# Patient Record
Sex: Female | Born: 1990
Health system: Southern US, Community
[De-identification: ages and names within clinical notes are randomized; demographics above are authoritative.]

## PROBLEM LIST (undated history)

## (undated) DIAGNOSIS — D649 Anemia, unspecified: Secondary | ICD-10-CM

## (undated) DIAGNOSIS — M79671 Pain in right foot: Secondary | ICD-10-CM

## (undated) DIAGNOSIS — R0600 Dyspnea, unspecified: Secondary | ICD-10-CM

## (undated) DIAGNOSIS — I10 Essential (primary) hypertension: Secondary | ICD-10-CM

## (undated) DIAGNOSIS — R600 Localized edema: Secondary | ICD-10-CM

## (undated) DIAGNOSIS — R45851 Suicidal ideations: Secondary | ICD-10-CM

## (undated) DIAGNOSIS — K5900 Constipation, unspecified: Secondary | ICD-10-CM

## (undated) DIAGNOSIS — F419 Anxiety disorder, unspecified: Secondary | ICD-10-CM

## (undated) DIAGNOSIS — F329 Major depressive disorder, single episode, unspecified: Secondary | ICD-10-CM

## (undated) DIAGNOSIS — R9431 Abnormal electrocardiogram [ECG] [EKG]: Secondary | ICD-10-CM

## (undated) DIAGNOSIS — M25569 Pain in unspecified knee: Secondary | ICD-10-CM

## (undated) DIAGNOSIS — E669 Obesity, unspecified: Secondary | ICD-10-CM

## (undated) DIAGNOSIS — Z789 Other specified health status: Secondary | ICD-10-CM

## (undated) DIAGNOSIS — F32A Depression, unspecified: Secondary | ICD-10-CM

## (undated) DIAGNOSIS — I2699 Other pulmonary embolism without acute cor pulmonale: Secondary | ICD-10-CM

## (undated) DIAGNOSIS — M79672 Pain in left foot: Secondary | ICD-10-CM

## (undated) HISTORY — DX: Dyspnea, unspecified: R06.00

## (undated) HISTORY — DX: Localized edema: R60.0

## (undated) HISTORY — DX: Essential (primary) hypertension: I10

## (undated) HISTORY — DX: Constipation, unspecified: K59.00

## (undated) HISTORY — PX: NO PAST SURGERIES: SHX2092

## (undated) HISTORY — DX: Pain in right foot: M79.672

## (undated) HISTORY — DX: Pain in unspecified knee: M25.569

## (undated) HISTORY — DX: Pain in right foot: M79.671

## (undated) HISTORY — DX: Suicidal ideations: R45.851

## (undated) HISTORY — DX: Other pulmonary embolism without acute cor pulmonale: I26.99

## (undated) HISTORY — DX: Abnormal electrocardiogram (ECG) (EKG): R94.31

## (undated) HISTORY — DX: Obesity, unspecified: E66.9

## (undated) HISTORY — DX: Anemia, unspecified: D64.9

## (undated) HISTORY — DX: Anxiety disorder, unspecified: F41.9

---

## 2013-02-15 ENCOUNTER — Encounter (HOSPITAL_COMMUNITY): Payer: Self-pay | Admitting: Emergency Medicine

## 2013-02-15 ENCOUNTER — Emergency Department (HOSPITAL_COMMUNITY)
Admission: EM | Admit: 2013-02-15 | Discharge: 2013-02-16 | Disposition: A | Discharge status: Other Acute Inpatient Hospital | Payer: BC Managed Care – PPO | Attending: Emergency Medicine | Admitting: Emergency Medicine

## 2013-02-15 DIAGNOSIS — Z3202 Encounter for pregnancy test, result negative: Secondary | ICD-10-CM | POA: Insufficient documentation

## 2013-02-15 DIAGNOSIS — R45851 Suicidal ideations: Secondary | ICD-10-CM

## 2013-02-15 LAB — COMPREHENSIVE METABOLIC PANEL
ALT: 7 U/L (ref 0–35)
AST: 15 U/L (ref 0–37)
Albumin: 4.1 g/dL (ref 3.5–5.2)
Alkaline Phosphatase: 95 U/L (ref 39–117)
BUN: 7 mg/dL (ref 6–23)
CO2: 24 mEq/L (ref 19–32)
Chloride: 105 mEq/L (ref 96–112)
Creatinine, Ser: 0.81 mg/dL (ref 0.50–1.10)
GFR calc non Af Amer: >90 mL/min (ref >90)
Potassium: 3.7 mEq/L (ref 3.5–5.1)
Sodium: 138 mEq/L (ref 135–145)
Total Bilirubin: 1 mg/dL (ref 0.3–1.2)

## 2013-02-15 LAB — ETHANOL: Alcohol, Ethyl (B): <11 mg/dL (ref 0–11)

## 2013-02-15 LAB — CBC
HCT: 34.7 % — ABNORMAL LOW (ref 36.0–46.0)
Hemoglobin: 10.4 g/dL — ABNORMAL LOW (ref 12.0–15.0)
MCV: 75.1 fL — ABNORMAL LOW (ref 78.0–100.0)
RBC: 4.62 MIL/uL (ref 3.87–5.11)
RDW: 14.6 % (ref 11.5–15.5)
WBC: 4.9 10*3/uL (ref 4.0–10.5)

## 2013-02-15 LAB — RAPID URINE DRUG SCREEN, HOSP PERFORMED
Benzodiazepines: NOT DETECTED
Opiates: NOT DETECTED
Tetrahydrocannabinol: NOT DETECTED

## 2013-02-15 MED ORDER — IBUPROFEN 200 MG PO TABS: 600.0000 mg | ORAL_TABLET | Freq: Three times a day (TID) | ORAL | Status: DC | PRN

## 2013-02-15 MED ORDER — LORAZEPAM 1 MG PO TABS: 1.0000 mg | ORAL_TABLET | Freq: Three times a day (TID) | ORAL | Status: DC | PRN

## 2013-02-15 MED ORDER — ONDANSETRON HCL 4 MG PO TABS: 4.0000 mg | ORAL_TABLET | Freq: Three times a day (TID) | ORAL | Status: DC | PRN

## 2013-02-15 MED ORDER — ACETAMINOPHEN 325 MG PO TABS: 650.0000 mg | ORAL_TABLET | ORAL | Status: DC | PRN

## 2013-02-15 MED ORDER — NICOTINE 21 MG/24HR TD PT24: 21.0000 mg | MEDICATED_PATCH | Freq: Every day | TRANSDERMAL | Status: DC

## 2013-02-15 MED ORDER — ZOLPIDEM TARTRATE 5 MG PO TABS: 5.0000 mg | ORAL_TABLET | Freq: Every evening | ORAL | Status: DC | PRN

## 2013-02-15 NOTE — ED Provider Notes (Signed)
CSN: [id number]     Arrival date & time 02/15/13  2014 History   First MD Initiated Contact with Patient 02/15/13 2056     Chief Complaint  Patient presents with  . Medical Clearance  . Suicidal   (Consider location/radiation/quality/duration/timing/severity/associated sxs/prior Treatment) The history is provided by the patient.   patient here with suicidal ideations with plan to cut her wrists. History of suicide attempt in February of this year. She denies any toxic ingestions at this time. Does have a history of depression but is not taking medications currently for this. Denies any command hallucinations. No alcohol or illicit drug use. Patient will not answer questions if there is something to exacerbate her current situation. No treatment used prior to arrival and nothing makes her symptoms better or worse.  History reviewed. No pertinent past medical history. History reviewed. No pertinent past surgical history. History reviewed. No pertinent family history. History  Substance Use Topics  . Smoking status: Never Smoker   . Smokeless tobacco: Not on file  . Alcohol Use: No   OB History   Grav Para Term Preterm Abortions TAB SAB Ect Mult Living                 Review of Systems  All other systems reviewed and are negative.    Allergies  Review of patient's allergies indicates no known allergies.  Home Medications  No current outpatient prescriptions on file. BP 145/89  Pulse 85  Temp(Src) 98.4 F (36.9 C) (Oral)  Resp 18  SpO2 100%  LMP 02/12/2013 Physical Exam  Nursing note and vitals reviewed. Constitutional: She is oriented to person, place, and time. She appears well-developed and well-nourished.  Non-toxic appearance. No distress.  HENT:  Head: Normocephalic and atraumatic.  Eyes: Conjunctivae, EOM and lids are normal. Pupils are equal, round, and reactive to light.  Neck: Normal range of motion. Neck supple. No tracheal deviation present. No mass present.   Cardiovascular: Normal rate, regular rhythm and normal heart sounds.  Exam reveals no gallop.   No murmur heard. Pulmonary/Chest: Effort normal and breath sounds normal. No stridor. No respiratory distress. She has no decreased breath sounds. She has no wheezes. She has no rhonchi. She has no rales.  Abdominal: Soft. Normal appearance and bowel sounds are normal. She exhibits no distension. There is no tenderness. There is no rebound and no CVA tenderness.  Musculoskeletal: Normal range of motion. She exhibits no edema and no tenderness.  Neurological: She is alert and oriented to person, place, and time. She has normal strength. No cranial nerve deficit or sensory deficit. GCS eye subscore is 4. GCS verbal subscore is 5. GCS motor subscore is 6.  Skin: Skin is warm and dry. No abrasion and no rash noted.  Psychiatric: Her affect is not blunt. Her speech is not delayed. She is not slowed. Thought content is not delusional. She expresses suicidal ideation. She expresses no homicidal ideation. She expresses suicidal plans. She expresses no homicidal plans.    ED Course  Procedures (including critical care time) Labs Review Labs Reviewed  CBC  COMPREHENSIVE METABOLIC PANEL  ETHANOL  URINE RAPID DRUG SCREEN (HOSP PERFORMED)   Imaging Review No results found.  EKG Interpretation   None       MDM  No diagnosis found. Patient has been medically cleared and will be seen by bhh    Toy Baker, MD 02/15/13 2105

## 2013-02-15 NOTE — ED Notes (Signed)
Pt brought in with [facility] police, they have had several calls today about her threatening to kill herself, they finally found her a few minutes ago and brought her in for medical clearance, [employer] officer is getting the IVC papers

## 2013-02-15 NOTE — BH Assessment (Signed)
[facility] Assessment Progress Note      Consulted Dr Freida Busman regarding TTS consult for [facility]  He reports, the patient called her friend and told her that she wanted to kill herself and that she says she cut her wrists in a suicide attempt, but would not let Dr Freida Busman see to verify.  Pt was brought in by [facility] police and is under IVC.

## 2013-02-15 NOTE — ED Notes (Signed)
TTS consult completed by Marchelle Folks.

## 2013-02-15 NOTE — ED Notes (Signed)
Pt belongings consist of: 1 pair of blue jeans,($5.89 in jean pocket) multicolor t-shirt, white footies, grey and pink sneakers, tan belt, panties, hair bow, car keys, pink phone charger, Cellphone

## 2013-02-15 NOTE — ED Notes (Signed)
Pt transferred from triage, presents for medical clearance.  Pt SI, found in [employer] parking lot with a knife she had purchased today.  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  Reports she attempted SI in past, Feb of this year, by attempting to hurt self with knife.  Denies HI or AV hallucinations at present.  Denies street drug or alcohol use.  Pt reports she is from [place].  Does not want to contact family at this time.  Pt calm & cooperative at present.

## 2013-02-16 ENCOUNTER — Encounter (HOSPITAL_COMMUNITY): Payer: Self-pay | Admitting: *Deleted

## 2013-02-16 ENCOUNTER — Inpatient Hospital Stay (HOSPITAL_COMMUNITY)
Admission: EM | Admit: 2013-02-16 | Discharge: 2013-02-21 | DRG: 885 | Disposition: A | Discharge status: Home / Self Care | Payer: Federal, State, Local not specified - Other | Source: Intra-hospital | Attending: Psychiatry | Admitting: Psychiatry

## 2013-02-16 DIAGNOSIS — R45851 Suicidal ideations: Secondary | ICD-10-CM

## 2013-02-16 DIAGNOSIS — F332 Major depressive disorder, recurrent severe without psychotic features: Principal | ICD-10-CM | POA: Diagnosis present

## 2013-02-16 HISTORY — DX: Other specified health status: Z78.9

## 2013-02-16 MED ORDER — ACETAMINOPHEN 325 MG PO TABS: 650.0000 mg | ORAL_TABLET | Freq: Four times a day (QID) | ORAL | Status: DC | PRN

## 2013-02-16 MED ORDER — MAGNESIUM HYDROXIDE 400 MG/5ML PO SUSP: 30.0000 mL | Freq: Every day | ORAL | Status: DC | PRN

## 2013-02-16 MED ORDER — BUPROPION HCL ER (XL) 150 MG PO TB24
150.0000 mg | ORAL_TABLET | Freq: Every day | ORAL | Status: DC
  Administered 2013-02-16 – 2013-02-21 (×6): 150 mg via ORAL
  Filled 2013-02-16 (×6): qty 1
  Filled 2013-02-16: qty 14
  Filled 2013-02-16 (×3): qty 1

## 2013-02-16 MED ORDER — ALUM & MAG HYDROXIDE-SIMETH 200-200-20 MG/5ML PO SUSP: 30.0000 mL | ORAL | Status: DC | PRN

## 2013-02-16 MED ORDER — TRAZODONE HCL 50 MG PO TABS
50.0000 mg | ORAL_TABLET | Freq: Every evening | ORAL | Status: DC | PRN
  Filled 2013-02-16: qty 1
  Filled 2013-02-16: qty 28
  Filled 2013-02-16 (×4): qty 1
  Filled 2013-02-16: qty 28
  Filled 2013-02-16 (×9): qty 1

## 2013-02-16 NOTE — Progress Notes (Signed)
Recreation Therapy Notes  Date: 11.25.2014 Time: 2:45pm Location: [address] Dayroom   Group Topic: Software engineer Activities (AAA)  Behavioral Response: Engaged, Appropriate   Affect: Flat  Clinical Observations/Feedback: Dog Team: [employer]. Patient interacted appropriately with peer, dog team, and LRT.   Marykay Lex Mida Cory, LRT/CTRS  Jearl Klinefelter 02/16/2013 4:57 PM
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
## 2013-02-16 NOTE — B[facility] Group Notes (Signed)
B[facility] LCSW Group Therapy  02/16/2013  1:15 PM   Type of Therapy:  Group Therapy  Participation Level:  Active  Participation Quality:  Attentive and Supportive  Affect:  Depressed and Flat  Cognitive:  Alert and Oriented  Insight:  Developing/Improving and Engaged  Engagement in Therapy:  Developing/Improving and Engaged  Modes of Intervention:  Activity, Clarification, Confrontation, Discussion, Education, Exploration, Limit-setting, Orientation, Problem-solving, Rapport Building, Dance movement psychotherapist, Socialization and Support  Summary of Progress/Problems: Patient was attentive and engaged with speaker from [employer] [facility].  Patient was attentive to speaker while they shared their story of dealing with mental health and overcoming it.  Patient expressed interest in their programs and services and received information on their agency.  Patient processed ways they can relate to the speaker.   Pt spoke with CSW individually after group and states that she wonders if she too has bipolar disorder, like the speaker as she could relate to him.    Reyes Ivan, LCSW 02/16/2013 2:43 PM
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
## 2013-02-16 NOTE — BH Assessment (Signed)
Tele Assessment Note   Shannon [name] is an 22 y.o. female under IVC who was brought to [facility] ED for assessment by [facility] police.  The patient reports that she texted her friend telling her she was going to commit suicide and that it was her plan to cut her wrists and she had a knife in her presence.  [name] presents with a depressed mood,  flat blunted affect, soft and slow speech.  She reports tearfulness, fatigue, lack of interest in usual pleasures, feelings of guilt and worthlessness, decreased concentration, and short term memory.  She states she is overwhelmed by school, but has difficulty expressing her feelings and does endorse a history of emotional abuse, but cannot identify any other stressors. She displays thought blocking and is a poor historian due to her depressed state.  She reports an increase in sleep=around 11 hours per night and decrease in grooming.  She denies HI and AVH as well as SA or any previous history of substance abuse.  Pt was reviewed by [facility] extender Lorre Nick who accepted her for admission to room 502-1.  EDP, Allen notified and is in agreement with the disposition.  PT is under IVC and will be transported by security.    Axis I: Major Depression, Recurrent severe Axis II: Deferred Axis III: History reviewed. No pertinent past medical history. Axis IV: educational problems and problems with primary support group Axis V: 31-40 impairment in reality testing  Past Medical History: History reviewed. No pertinent past medical history.  History reviewed. No pertinent past surgical history.  Family History: History reviewed. No pertinent family history.  Social History:  reports that she has never smoked. She does not have any smokeless tobacco history on file. She reports that she does not drink alcohol. Her drug history is not on file.  Additional Social History:  Alcohol / Drug Use History of alcohol / drug use?: No history of alcohol / drug abuse  CIWA:  CIWA-Ar BP: 125/86 mmHg Pulse Rate: 67 COWS:    Allergies: No Known Allergies  Home Medications:  (Not in a hospital admission)  OB/GYN Status:  Patient's last menstrual period was 02/12/2013.  General Assessment Data Location of Assessment: [facility] ED Is this a Tele or Face-to-Face Assessment?: Tele Assessment Is this an Initial Assessment or a Re-assessment for this encounter?: Initial Assessment Living Arrangements: Other (Comment) (college roommate) Can pt return to current living arrangement?: Yes Admission Status: Involuntary Is patient capable of signing voluntary admission?: Yes Transfer from: Acute Hospital Referral Source: Self/Family/Friend     [facility] Crisis Care Plan Living Arrangements: Other (Comment) (college roommate) Name of Therapist: Harriett Rush Hinsley  Education Status Is patient currently in school?: Yes Current Grade: Jr year of college  Highest grade of school patient has completed: Sophomore year of college Name of school: [employer]  Risk to self Suicidal Ideation: Yes-Currently Present Suicidal Intent: No-Not Currently/Within Last 6 Months Is patient at risk for suicide?: Yes Suicidal Plan?: Yes-Currently Present Specify Current Suicidal Plan: cut wrists with knife Access to Means: Yes Specify Access to Suicidal Means: had a knife in her posession What has been your use of drugs/alcohol within the last 12 months?: denies Previous Attempts/Gestures: No How many times?: 0 Intentional Self Injurious Behavior: None Family Suicide History: No Recent stressful life event(s): Other (Comment) (overwhelmed by school) Persecutory voices/beliefs?: No Depression: Yes Depression Symptoms: Despondent;Insomnia;Tearfulness;Isolating;Fatigue;Guilt;Loss of interest in usual pleasures;Feeling worthless/self pity Substance abuse history and/or treatment for substance abuse?: No Suicide prevention information given  to non-admitted patients: Not applicable  Risk to  Others Homicidal Ideation: No Thoughts of Harm to Others: No Current Homicidal Intent: No Current Homicidal Plan: No Access to Homicidal Means: No History of harm to others?: No Assessment of Violence: None Noted Does patient have access to weapons?: No Criminal Charges Pending?: No Does patient have a court date: No  Psychosis Hallucinations: None noted Delusions: None noted  Mental Status Report Appear/Hygiene: Disheveled Eye Contact: Fair Motor Activity: Freedom of movement Speech: Soft;Slow Level of Consciousness: Quiet/awake Mood: Depressed Affect: Depressed;Blunted Anxiety Level: None Thought Processes: Coherent;Relevant Judgement: Impaired Orientation: Place;Person;Time;Situation Obsessive Compulsive Thoughts/Behaviors: None  Cognitive Functioning Concentration: Decreased Memory: Recent Impaired;Remote Intact IQ: Average Insight: Poor Impulse Control: Fair Appetite: Fair Weight Loss: 0 Weight Gain: 0 Sleep: Increased Total Hours of Sleep: 11 Vegetative Symptoms: Staying in bed;Decreased grooming  ADLScreening [facility] Assessment Services) Patient's cognitive ability adequate to safely complete daily activities?: Yes Patient able to express need for assistance with ADLs?: Yes Independently performs ADLs?: Yes (appropriate for developmental age)  Prior Inpatient Therapy Prior Inpatient Therapy: No  Prior Outpatient Therapy Prior Outpatient Therapy: Yes Prior Therapy Dates: ongoing since 2012 Prior Therapy Facilty/Provider(s): [employer] Reason for Treatment: Depression  ADL Screening (condition at time of admission) Patient's cognitive ability adequate to safely complete daily activities?: Yes Patient able to express need for assistance with ADLs?: Yes Independently performs ADLs?: Yes (appropriate for developmental age)       Abuse/Neglect Assessment (Assessment to be complete while patient is alone) Physical Abuse: Denies Verbal Abuse:  Denies Sexual Abuse: Denies Exploitation of patient/patient's resources: Denies Values / Beliefs Cultural Requests During Hospitalization: None Spiritual Requests During Hospitalization: None   Advance Directives (For Healthcare) Advance Directive: Patient does not have advance directive;Patient would not like information Pre-existing out of facility DNR order (yellow form or pink MOST form): No Nutrition Screen- [facility] Adult/[facility] Patient's home diet: Regular  Additional Information 1:1 In Past 12 Months?: No CIRT Risk: No Elopement Risk: No Does patient have medical clearance?: Yes     Disposition:  Disposition Initial Assessment Completed for this Encounter: Yes Disposition of Patient: Inpatient treatment program Type of inpatient treatment program: Adult  Steward Ros 02/16/2013 12:18 AM

## 2013-02-16 NOTE — Progress Notes (Signed)
Patient ID: Shannon [name], female   DOB: Oct 25, 1990, 22 y.o.   MRN: [id number]  D: Pt did not appear to be as anxious today as previous encounter. Pt smiled and laughed during the assessment this evening. However, when asked if she spoke to the dr and sw about her concerns regarding school pt stated, "I don't know what you mean". Writer explained and pt stated a call was made to school and that she was also given a number to call upon discharge.  Pt refused the scheduled hs med.  A:  Support and encouragement was offered. 15 min checks continued for safety.  R: Pt remains safe.

## 2013-02-16 NOTE — Progress Notes (Signed)
The focus of this group is to educate the patient on the purpose and policies of crisis stabilization and provide a format to answer questions about their admission.  The group details unit policies and expectations of patients while admitted. Patient did not attend group because she did not want to come and reports " I want to go home".

## 2013-02-16 NOTE — Progress Notes (Signed)
D: Patient denies SI/HI and A/V hallucinations; patient reports sleep is well; reports appetite is improving; reports energy level is normal; reports ability to pay attention is good; rates depression as 3/10; rates hopelessness 0/10; rates anxiety as 0/10; patient reports that she wants to go home  A: Monitored q 15 minutes; patient encouraged to attend groups; patient educated about medications; patient given medications per physician orders; patient encouraged to express feelings and/or concerns  R: Patient is sad and reports that she does not want to be here; patient is irritable; patient's interaction with staff and peers is isolative; patient was able to set goal to talk with staff 1:1 when having feelings of SI; patient is taking medications as prescribed and tolerating medications; patient is attending some groups

## 2013-02-16 NOTE — Tx Team (Signed)
Initial Interdisciplinary Treatment Plan  PATIENT STRENGTHS: (choose at least two) Average or above average intelligence General fund of knowledge Physical Health Supportive family/friends Work skills  PATIENT STRESSORS: Educational concerns   PROBLEM LIST: Problem List/Patient Goals Date to be addressed Date deferred Reason deferred Estimated date of resolution  Increased risk for suicide 02/16/13     Depression 02/16/13                                                DISCHARGE CRITERIA:  Ability to meet basic life and health needs Adequate post-discharge living arrangements Improved stabilization in mood, thinking, and/or behavior Motivation to continue treatment in a less acute level of care Need for constant or close observation no longer present Reduction of life-threatening or endangering symptoms to within safe limits Safe-care adequate arrangements made Verbal commitment to aftercare and medication compliance  PRELIMINARY DISCHARGE PLAN: Outpatient therapy Participate in family therapy  PATIENT/FAMIILY INVOLVEMENT: This treatment plan has been presented to and reviewed with the patient, Shannon [name], and/or family member.  The patient and family have been given the opportunity to ask questions and make suggestions.  [name] 02/16/2013, 1:33 AM

## 2013-02-16 NOTE — Progress Notes (Signed)
Adult Psychoeducational Group Note  Date:  02/16/2013 Time:  8:00 pm  Group Topic/Focus:  Wrap-Up Group:   The focus of this group is to help patients review their daily goal of treatment and discuss progress on daily workbooks.  Participation Level:  Active  Participation Quality:  Appropriate and Sharing  Affect:  Appropriate  Cognitive:  Appropriate  Insight: Appropriate  Engagement in Group:  Engaged  Modes of Intervention:  Discussion, Education, Socialization and Support  Additional Comments:  Pt stated that she is in the hospital for having suicidal thoughts. Pt stated that she is soft spoken and kind when asked to share two positive character traits.   Ashaun Gaughan 02/16/2013, 9:22 PM

## 2013-02-16 NOTE — Progress Notes (Signed)
Patient ID: Shannon [name], female   DOB: [birth year], 22 y.o.   MRN: [id number]  Pt was tearful during the adm process, refusing to go into detail with most of the questions. When asked the circumstances surrounding her adm pt stated, "I just wanna go home". When asked If she had done I/P in the past pt stated, "I don't know". Pt asked, "what am I gonna do tomorrow when I flunk my class?" Stated if she misses class she won't pass. However, pt stated "It doesn't matter. I'm already flunking all my classes." Pt denied going to [employer] [facility] [facility] [employer] stating that she goes to [employer]. Informed the pt that the CM would contact the school if she wished.  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 Stated [employer] Pd took pt to [facility] due to making several calls threatening to harm herself while in the parking lot.  Stated pt had purchased a knife, and was found with the knife when PD arrived. Report states pt is from [place] and does not want her family contacted.  Once on the unit pt asked the writer to have someone contact her school. However, pt didn't know the professors name. Stated she couldn't pronounce it or spell it. Stated they call him "LuLu", and it's her accounting professor. Pt continued to cry laying in bed but refused any meds. Pt denied SI, HI, A/V.

## 2013-02-16 NOTE — Progress Notes (Signed)
Adult Psychoeducational Group Note  Date:  02/16/2013 Time:  11:00am Group Topic/Focus:  Recovery Goals:   The focus of this group is to identify appropriate goals for recovery and establish a plan to achieve them.  Participation Level:  Did Not Attend  Participation Quality:    Affect:    Cognitive:   Insight:   Engagement in Group:    Modes of Intervention:    Additional Comments:  Pt did not attend group.  Shelly Bombard D 02/16/2013, 2:12 PM

## 2013-02-16 NOTE — ED Notes (Signed)
[employer] transport requested. 

## 2013-02-16 NOTE — [facility] Suicide Risk Assessment (Signed)
Suicide Risk Assessment  Admission Assessment     Nursing information obtained from:  Patient Demographic factors:  Adolescent or young adult Current Mental Status:  NA Loss Factors:  NA Historical Factors:  Prior suicide attempts Risk Reduction Factors:  Employed  CLINICAL FACTORS:   Severe Anxiety and/or Agitation Depression:   Anhedonia Hopelessness Impulsivity Insomnia Recent sense of peace/wellbeing Severe Unstable or Poor Therapeutic Relationship Previous Psychiatric Diagnoses and Treatments  COGNITIVE FEATURES THAT CONTRIBUTE TO RISK:  Closed-mindedness Loss of executive function Polarized thinking Thought constriction (tunnel vision)    SUICIDE RISK:   Moderate:  Frequent suicidal ideation with limited intensity, and duration, some specificity in terms of plans, no associated intent, good self-control, limited dysphoria/symptomatology, some risk factors present, and identifiable protective factors, including available and accessible social support.  PLAN OF CARE: Admit involuntarily and emergently from [facility] long emergency department for increased symptoms of depression, anxiety and suicidal ideation with plan of cutting herself.  I certify that inpatient services furnished can reasonably be expected to improve the patient's condition.  Jailene Cupit,JANARDHAHA R. 02/16/2013, 11:48 AM

## 2013-02-17 NOTE — Progress Notes (Signed)
Nutrition Brief Note  Patient identified on the Malnutrition Screening Tool (MST) Report.  Wt Readings from Last 10 Encounters:  02/16/13 252 lb 4 oz (114.42 kg)   Body mass index is 40.73 kg/(m^2). Patient meets criteria for class III extreme obesity based on current BMI.   Discussed intake PTA with patient and compared to intake presently.  Discussed changes in intake, if any, and encouraged adequate intake of meals and snacks. Met with pt who reports good appetite PTA, eats 2 meals/day that are healthy and well balanced. Denies any changes in weight. Reports good appetite and intake during admission.   Current diet order is regular and pt is also offered choice of unit snacks mid-morning and mid-afternoon.  Pt is eating as desired.   Labs and medications reviewed.   No nutrition diagnosis or intervention found at this time.   No additional nutrition interventions warranted at this time. If nutrition issues arise, please consult RD.   Levon Hedger MS, RD, LDN [phone] Pager [phone] After Hours Pager

## 2013-02-17 NOTE — [facility] Group Notes (Signed)
[facility] LCSW Group Therapy  02/17/2013  1:15 PM   Type of Therapy:  Group Therapy  Participation Level:  Active  Participation Quality:  Attentive, Sharing and Supportive  Affect:  Depressed and Flat  Cognitive:  Alert and Oriented  Insight:  Developing/Improving and Engaged  Engagement in Therapy:  Developing/Improving and Engaged  Modes of Intervention:  Clarification, Confrontation, Discussion, Education, Exploration, Limit-setting, Orientation, Problem-solving, Rapport Building, Dance movement psychotherapist, Socialization and Support  Summary of Progress/Problems: The topic for group today was emotional regulation.  This group focused on both positive and negative emotion identification and allowed group members to process ways to identify feelings, regulate negative emotions, and find healthy ways to manage internal/external emotions. Group members were asked to reflect on a time when their reaction to an emotion led to a negative outcome and explored how alternative responses using emotion regulation would have benefited them. Group members were also asked to discuss a time when emotion regulation was utilized when a negative emotion was experienced.  Pt shared that she was isolating herself due to feeling lonely, which led to the depression.  Pt states that she plans to surround herself with people and get out more, as well as talk when something is bothering her.  Pt states that she will also go for a walk as a coping skill.  Pt actively participated and was engaged in group discussion.    Reyes Ivan, LCSW 02/17/2013 1:53 PM

## 2013-02-17 NOTE — Progress Notes (Signed)
Recreation Therapy Notes  Date: 11.26.2014 Time: 3:00pm Location: [address] Dayroom   Group Topic: Coping Skills  Goal Area(s) Addresses:  Patient will complete grateful mandala. Patient will identify benefit of identifying things he/she is grateful for.    Behavioral Response: Appropriate  Intervention: Mandala  Activity: I am Grateful Mandala. Patient was asked to identify things they are grateful for to fit in various categories such as Happiness, Laughter; Mind, Body, Spirit; Knowledge, Education  Education: Pharmacologist, Discharge Planning  Education Outcome: Acknowledges education   Clinical Observations/Feedback: Patient actively participated in group activity, identifying things, people or items to correspond with each category. Patient identified that this activity makes you reflect on the positive things in your life, thus it shifts your mind set to a positive place.   Marykay Lex Danyell Awbrey, LRT/CTRS  Samoria Fedorko L 02/17/2013 3:52 PM

## 2013-02-17 NOTE — B[facility] Suicide Risk Assessment (Signed)
[facility] INPATIENT:  Family/Significant Other Suicide Prevention Education  Suicide Prevention Education:  Education Completed; Shannon [name] - friend [phone]),  (name of family member/significant other) has been identified by the patient as the family member/significant other with whom the patient will be residing, and identified as the person(s) who will aid the patient in the event of a mental health crisis (suicidal ideations/suicide attempt).  With written consent from the patient, the family member/significant other has been provided the following suicide prevention education, prior to the and/or following the discharge of the patient.  The suicide prevention education provided includes the following:  Suicide risk factors  Suicide prevention and interventions  National Suicide Hotline telephone number  [facility] assessment telephone number  [facility] Emergency Assistance 911  [facility] and/or Residential Mobile Crisis Unit telephone number  Request made of family/significant other to:  Remove weapons (e.g., guns, rifles, knives), all items previously/currently identified as safety concern.    Remove drugs/medications (over-the-counter, prescriptions, illicit drugs), all items previously/currently identified as a safety concern.  The family member/significant other verbalizes understanding of the suicide prevention education information provided.  The family member/significant other agrees to remove the items of safety concern listed above.  Shannon [name] 02/17/2013, 11:37 AM

## 2013-02-17 NOTE — Progress Notes (Signed)
Adult Psychoeducational Group Note  Date:  02/17/2013 Time:  8:56 PM  Group Topic/Focus:  Wrap-Up Group:   The focus of this group is to help patients review their daily goal of treatment and discuss progress on daily workbooks.  Participation Level:  Active  Participation Quality:  Appropriate  Affect:  Appropriate  Cognitive:  Appropriate  Insight: Appropriate  Engagement in Group:  Engaged  Modes of Intervention:  Support  Additional Comments:  Pt stated that one good thing that happened today was that she was able to get candy. She stated one thing that she learned was that to say positive things about herself to boost her self-esteem. When asked to list two she listed that she is a creative person and likes to try new things, one new thing is to journal. She was given a journal so that she can begin journaling.   Natausha Jungwirth 02/17/2013, 8:56 PM

## 2013-02-17 NOTE — B[facility] Group Notes (Signed)
[facility] LCSW Aftercare Discharge Planning Group Note   02/17/2013 8:45 AM  Participation Quality:  Alert and Appropriate   Mood/Affect:  Flat and Depressed  Depression Rating:  0  Anxiety Rating:  0  Thoughts of Suicide:  Pt denies SI/HI  Will you contract for safety?   Yes  Current AVH:  Pt denies  Plan for Discharge/Comments:  Pt attended discharge planning group and actively participated in group.  CSW provided pt with today's workbook.  Pt states that she feels fine today and wants to d/c.  Pt will return to [facility] campus upon d/c.  CSW contacted the [facility] case manager, Maurine Minister, to assist pt with getting back into school, with pt's permission.  Pt is aware of the process.  Pt has follow up scheduled at [facility] for medication management and therapy.  No further needs voiced by pt at this time.    Transportation Means: Pt reports access to transportation - bus pass provided  Supports: No supports mentioned at this time  Reyes Ivan, LCSW 02/17/2013 10:01 AM

## 2013-02-17 NOTE — Progress Notes (Signed)
D: Patient denies SI/HI and A/V hallucinations; patient reports sleep is well; reports appetite is good ; reports energy level is normal ; reports ability to pay attention is good; rates depression as 1/10; rates hopelessness 0/10; rates anxiety as 0/10;   A: Monitored q 15 minutes; patient encouraged to attend groups; patient educated about medications; patient given medications per physician orders; patient encouraged to express feelings and/or concerns  R: Patient is flat and guarded but brighter than previous days in the hospital; patient is cooperative but sad in approach; patient's interaction with staff and peers is very minimal; patient was able to set goal to talk with staff 1:1 when having feelings of SI; patient is taking medications as prescribed and tolerating medications; patient is attending all groups but forwards little

## 2013-02-17 NOTE — Progress Notes (Signed)
[facility] MD Progress Note  02/17/2013 1:35 PM Shannon [name]  MRN:  [id number] Subjective:  Patient seen and is reporting she is doing well. She is askig about going home. Rates her depression as a 1/10 and her anxiety as a 0.  States she is eating well and sleeping well.  Shannon [name] says she is attendng groups and has no side effects from the medication.   She is flat affected with a poverty of speech. Diagnosis:   Assessment:  DSM5:  Schizophrenia Disorders:  Obsessive-Compulsive Disorders:  Trauma-Stressor Disorders:  Substance/Addictive Disorders:  Depressive Disorders: Major Depressive Disorder - Severe (296.23)  AXIS I: Major Depression, Recurrent severe  AXIS II: Deferred  AXIS III:  Past Medical History   Diagnosis  Date   .  Medical history non-contributory     AXIS IV: economic problems, educational problems, other psychosocial or environmental problems, problems related to social environment and problems with primary support group  AXIS V: 41-50 serious symptoms      DADL's:  Intact  Sleep: Good  Appetite:  Good  Suicidal Ideation:  decreasing Homicidal Ideation:  denies AEB (as evidenced by):  Psychiatric Specialty Exam: Review of Systems  Constitutional: Negative.  Negative for fever, chills, weight loss, malaise/fatigue and diaphoresis.  HENT: Negative for congestion and sore throat.   Eyes: Negative for blurred vision, double vision and photophobia.  Respiratory: Negative for cough, shortness of breath and wheezing.   Cardiovascular: Negative for chest pain, palpitations and PND.  Gastrointestinal: Negative for heartburn, nausea, vomiting, abdominal pain, diarrhea and constipation.  Musculoskeletal: Negative for falls, joint pain and myalgias.  Neurological: Negative for dizziness, tingling, tremors, sensory change, speech change, focal weakness, seizures, loss of consciousness, weakness and headaches.  Endo/Heme/Allergies: Negative for polydipsia. Does not  bruise/bleed easily.  Psychiatric/Behavioral: Negative for depression, suicidal ideas, hallucinations, memory loss and substance abuse. The patient is not nervous/anxious and does not have insomnia.     Blood pressure 119/63, pulse 73, temperature 97.6 F (36.4 C), temperature source Oral, resp. rate 16, height 5\' 6"  (1.676 m), weight 114.42 kg (252 lb 4 oz), last menstrual period 02/12/2013.Body mass index is 40.73 kg/(m^2).  General Appearance: Fairly Groomed  Patent attorney::  Fair  Speech:  Clear and Coherent  Volume:  Decreased  Mood:  Angry and Depressed  Affect:  Flat  Thought Process:  Goal Directed  Orientation:  Full (Time, Place, and Person)  Thought Content:  WDL  Suicidal Thoughts:  Yes.  without intent/plan  Homicidal Thoughts:  No  Memory:  NA  Judgement:  Impaired  Insight:  Shallow  Psychomotor Activity:  Normal  Concentration:  Fair  Recall:  Fair  Akathisia:  No  Handed:  Right  AIMS (if indicated):     Assets:  Communication Skills Desire for Improvement Physical Health  Sleep:  Number of Hours: 6   Current Medications: Current Facility-Administered Medications  Medication Dose Route Frequency Provider Last Rate Last Dose  . acetaminophen (TYLENOL) tablet 650 mg  650 mg Oral Q6H PRN Kerry Hough, PA-C      . alum & mag hydroxide-simeth (MAALOX/MYLANTA) 200-200-20 MG/5ML suspension 30 mL  30 mL Oral Q4H PRN Kerry Hough, PA-C      . buPROPion (WELLBUTRIN XL) 24 hr tablet 150 mg  150 mg Oral Daily Nehemiah Settle, MD   150 mg at 02/17/13 0801  . magnesium hydroxide (MILK OF MAGNESIA) suspension 30 mL  30 mL Oral Daily PRN Kerry Hough, PA-C      .  traZODone (DESYREL) tablet 50 mg  50 mg Oral QHS,MR X 1 Kerry Hough, PA-C        Lab Results:  Results for orders placed during the hospital encounter of 02/15/13 (from the past 48 hour(s))  CBC     Status: Abnormal   Collection Time    02/15/13  9:11 PM      Result Value Range   WBC 4.9   4.0 - 10.5 K/uL   RBC 4.62  3.87 - 5.11 MIL/uL   Hemoglobin 10.4 (*) 12.0 - 15.0 g/dL   HCT 16.1 (*) 09.6 - 04.5 %   MCV 75.1 (*) 78.0 - 100.0 fL   MCH 22.5 (*) 26.0 - 34.0 pg   MCHC 30.0  30.0 - 36.0 g/dL   RDW 40.9  81.1 - 91.4 %   Platelets 406 (*) 150 - 400 K/uL  COMPREHENSIVE METABOLIC PANEL     Status: None   Collection Time    02/15/13  9:11 PM      Result Value Range   Sodium 138  135 - 145 mEq/L   Potassium 3.7  3.5 - 5.1 mEq/L   Chloride 105  96 - 112 mEq/L   CO2 24  19 - 32 mEq/L   Glucose, Bld 87  70 - 99 mg/dL   BUN 7  6 - 23 mg/dL   Creatinine, Ser 7.82  0.50 - 1.10 mg/dL   Calcium 9.5  8.4 - 95.6 mg/dL   Total Protein 8.1  6.0 - 8.3 g/dL   Albumin 4.1  3.5 - 5.2 g/dL   AST 15  0 - 37 U/L   ALT 7  0 - 35 U/L   Alkaline Phosphatase 95  39 - 117 U/L   Total Bilirubin 1.0  0.3 - 1.2 mg/dL   GFR calc non Af Amer >90  >90 mL/min   GFR calc Af Amer >90  >90 mL/min   Comment: (NOTE)     The eGFR has been calculated using the CKD EPI equation.     This calculation has not been validated in all clinical situations.     eGFR's persistently <90 mL/min signify possible Chronic Kidney     Disease.  ETHANOL     Status: None   Collection Time    02/15/13  9:11 PM      Result Value Range   Alcohol, Ethyl (B) <11  0 - 11 mg/dL   Comment:            LOWEST DETECTABLE LIMIT FOR     SERUM ALCOHOL IS 11 mg/dL     FOR MEDICAL PURPOSES ONLY  URINE RAPID DRUG SCREEN (HOSP PERFORMED)     Status: None   Collection Time    02/15/13  9:36 PM      Result Value Range   Opiates NONE DETECTED  NONE DETECTED   Cocaine NONE DETECTED  NONE DETECTED   Benzodiazepines NONE DETECTED  NONE DETECTED   Amphetamines NONE DETECTED  NONE DETECTED   Tetrahydrocannabinol NONE DETECTED  NONE DETECTED   Barbiturates NONE DETECTED  NONE DETECTED   Comment:            DRUG SCREEN FOR MEDICAL PURPOSES     ONLY.  IF CONFIRMATION IS NEEDED     FOR ANY PURPOSE, NOTIFY LAB     WITHIN 5 DAYS.                 LOWEST DETECTABLE LIMITS  FOR URINE DRUG SCREEN     Drug Class       Cutoff (ng/mL)     Amphetamine      1000     Barbiturate      200     Benzodiazepine   200     Tricyclics       300     Opiates          300     Cocaine          300     THC              50  POCT PREGNANCY, URINE     Status: None   Collection Time    02/15/13  9:55 PM      Result Value Range   Preg Test, Ur NEGATIVE  NEGATIVE   Comment:            THE SENSITIVITY OF THIS     METHODOLOGY IS >24 mIU/mL    Physical Findings: AIMS: Facial and Oral Movements Muscles of Facial Expression: None, normal Lips and Perioral Area: None, normal Jaw: None, normal Tongue: None, normal,Extremity Movements Upper (arms, wrists, hands, fingers): None, normal Lower (legs, knees, ankles, toes): None, normal, Trunk Movements Neck, shoulders, hips: None, normal, Overall Severity Severity of abnormal movements (highest score from questions above): None, normal Incapacitation due to abnormal movements: None, normal Patient's awareness of abnormal movements (rate only patient's report): No Awareness,    CIWA:    COWS:     Treatment Plan Summary: Daily contact with patient to assess and evaluate symptoms and progress in treatment Medication management  Plan: 1. Continue crisis management and stabilization. 2. Medication management to reduce current symptoms to base line and improve patient's overall level of functioning 3. Treat health problems as indicated. 4. Develop treatment plan to decrease risk of relapse upon discharge and the need for     readmission. 5. Psycho-social education regarding relapse prevention and self care. 6. Health care follow up as needed for medical problems. 7. Continue home medications where appropriate.   Medical Decision Making Problem Points:  Established problem, stable/improving (1) Data Points:  Review or order clinical lab tests (1) Review of medication regiment & side effects  (2)  I certify that inpatient services furnished can reasonably be expected to improve the patient's condition.   Shannon [name] 02/17/2013, 1:35 PM

## 2013-02-18 NOTE — Progress Notes (Signed)
Patient ID: Shannon [name], female   DOB: 10/04/1990, 22 y.o.   MRN: [id number]  D: Patient playing a game with staff in dayroom this am. Patient very soft spoken on approach but reports mood better. Gives depression and hopelessness "1" on scale. Currently denies any SI at this time. A: Staff will monitor on q 15 minute checks, follow treatment plan, and give meds as ordered. R: Took wellbutrin without issue this am.

## 2013-02-18 NOTE — Progress Notes (Addendum)
D: Pt is denying any psychosocial symptoms. This includes being negative for any SI/HI/AVH. Pt has been recording positive things about herself in her journal. Writer congratulated pt on her progress. Writer initiated the topic of school with this pt. Pt plans to become more proactive in reaching out for help in a timely matter for her studies. Pt attended group this evening. Pt observed with increased interaction on the unit.  A: Continued support and availability as needed was extended to this pt. Staff continue to monitor pt with q15min checks.  R:  Pt receptive to treatment. Pt remains safe at this time.

## 2013-02-18 NOTE — Progress Notes (Signed)
Patient ID: Shannon [name], female   DOB: Feb 15, 1991, 22 y.o.   MRN: [id number] [facility] MD Progress Note  02/18/2013 10:47 AM Ravon Mcilhenny  MRN:  [id number] Subjective: Patient reports she is doing well and has no new complaints. She has been up and active in the unit milieu. 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 States the medication is helping and she reports no side effects. Affect is brighter today, less flat, more eye contact, speech is more spontaneous. Diagnosis:   Assessment:  DSM5:  Schizophrenia Disorders:  Obsessive-Compulsive Disorders:  Trauma-Stressor Disorders:  Substance/Addictive Disorders:  Depressive Disorders: Major Depressive Disorder - Severe (296.23)  AXIS I: Major Depression, Recurrent severe  AXIS II: Deferred  AXIS III:  Past Medical History   Diagnosis  Date   .  Medical history non-contributory     AXIS IV: economic problems, educational problems, other psychosocial or environmental problems, problems related to social environment and problems with primary support group  AXIS V: 41-50 serious symptoms   DADL's:  Intact  Sleep: Good  Appetite:  Good  Suicidal Ideation:  decreasing Homicidal Ideation:  denies AEB (as evidenced by):  Psychiatric Specialty Exam: Review of Systems  Constitutional: Negative.  Negative for fever, chills, weight loss, malaise/fatigue and diaphoresis.  HENT: Negative for congestion and sore throat.   Eyes: Negative for blurred vision, double vision and photophobia.  Respiratory: Negative for cough, shortness of breath and wheezing.   Cardiovascular: Negative for chest pain, palpitations and PND.  Gastrointestinal: Negative for heartburn, nausea, vomiting, abdominal pain, diarrhea and constipation.  Musculoskeletal: Negative for falls, joint pain and myalgias.  Neurological: Negative for dizziness, tingling, tremors, sensory change, speech change, focal weakness, seizures, loss  of consciousness, weakness and headaches.  Endo/Heme/Allergies: Negative for polydipsia. Does not bruise/bleed easily.  Psychiatric/Behavioral: Negative for depression, suicidal ideas, hallucinations, memory loss and substance abuse. The patient is not nervous/anxious and does not have insomnia.     Blood pressure 118/83, pulse 91, temperature 97.9 F (36.6 C), temperature source Oral, resp. rate 18, height 5\' 6"  (1.676 m), weight 114.42 kg (252 lb 4 oz), last menstrual period 02/12/2013.Body mass index is 40.73 kg/(m^2).  General Appearance: Fairly Groomed  Patent attorney::  Fair  Speech:  Clear and Coherent  Volume:  Decreased  Mood:  Angry and Depressed  Affect:  Flat  Thought Process:  Goal Directed  Orientation:  Full (Time, Place, and Person)  Thought Content:  WDL  Suicidal Thoughts:  Yes.  without intent/plan  Homicidal Thoughts:  No  Memory:  NA  Judgement:  Impaired  Insight:  Shallow  Psychomotor Activity:  Normal  Concentration:  Fair  Recall:  Fair  Akathisia:  No  Handed:  Right  AIMS (if indicated):     Assets:  Communication Skills Desire for Improvement Physical Health  Sleep:  Number of Hours: 6   Current Medications: Current Facility-Administered Medications  Medication Dose Route Frequency Provider Last Rate Last Dose  . acetaminophen (TYLENOL) tablet 650 mg  650 mg Oral Q6H PRN Kerry Hough, PA-C      . alum & mag hydroxide-simeth (MAALOX/MYLANTA) 200-200-20 MG/5ML suspension 30 mL  30 mL Oral Q4H PRN Kerry Hough, PA-C      . buPROPion (WELLBUTRIN XL) 24 hr tablet 150 mg  150 mg Oral Daily Nehemiah Settle, MD   150 mg at 02/18/13 0815  . magnesium hydroxide (MILK OF MAGNESIA) suspension 30 mL  30 mL Oral Daily PRN Kerry Hough, PA-C      . traZODone (DESYREL) tablet 50 mg  50 mg Oral QHS,MR X 1 Kerry Hough, PA-C        Lab Results:  No results found for this or any previous visit (from the past 48 hour(s)).  Physical  Findings: AIMS: Facial and Oral Movements Muscles of Facial Expression: None, normal Lips and Perioral Area: None, normal Jaw: None, normal Tongue: None, normal,Extremity Movements Upper (arms, wrists, hands, fingers): None, normal Lower (legs, knees, ankles, toes): None, normal, Trunk Movements Neck, shoulders, hips: None, normal, Overall Severity Severity of abnormal movements (highest score from questions above): None, normal Incapacitation due to abnormal movements: None, normal Patient's awareness of abnormal movements (rate only patient's report): No Awareness,    CIWA:    COWS:     Treatment Plan Summary: Daily contact with patient to assess and evaluate symptoms and progress in treatment Medication management  Plan: 1. Continue crisis management and stabilization. 2. Medication management to reduce current symptoms to base line and improve patient's overall level of functioning 3. Treat health problems as indicated. 4. Develop treatment plan to decrease risk of relapse upon discharge and the need for     readmission. 5. Psycho-social education regarding relapse prevention and self care. 6. Health care follow up as needed for medical problems. 7. Continue home medications where appropriate.   Medical Decision Making Problem Points:  Established problem, stable/improving (1) Data Points:  Review or order clinical lab tests (1) Review of medication regiment & side effects (2)  I certify that inpatient services furnished can reasonably be expected to improve the patient's condition.   MASHBURN,NEIL 02/18/2013, 10:47 AM Agree with assessment and plan Madie Reno A. Dub Mikes, M.D.

## 2013-02-18 NOTE — [facility] Group Notes (Signed)
[facility] Group Notes:  (Nursing/MHT/Case Management/Adjunct)  Date:  02/18/2013  Time:  0915  Type of Therapy:  Psychoeducational Skills  Participation Level:  Active  Participation Quality:  Appropriate  Affect:  Appropriate  Cognitive:  Appropriate  Insight:  Good  Engagement in Group:  Limited  Modes of Intervention:  Support  Summary of Progress/Problems: Morning Wellness group  Andres Ege 02/18/2013, 10:39 AM

## 2013-02-19 DIAGNOSIS — R45851 Suicidal ideations: Secondary | ICD-10-CM

## 2013-02-19 NOTE — Progress Notes (Signed)
Patient ID: Shannon [name], female   DOB: April 11, 1990, 22 y.o.   MRN: [id number] D)  Has been resting quietly tonight, eyes closed, resp reg, unlabored, no c/o's voiced. A)  Will continue to monitor for safety, continue POC R)  safety maintained.

## 2013-02-19 NOTE — Progress Notes (Signed)
[name] is seen OOB uAL on the 500  hall today...she is shy, nervous-like and guarded and quiet. She avoids eye contact. She  Takes her meds as ordered and she has attended her groups, als  A She denies active SI but is not able to complete and return her self inventory because it is unavailable to the unit today.   A She denies SI.   r sAFETY IS IN PLACE AND POC CONT.

## 2013-02-19 NOTE — Progress Notes (Signed)
Adult Psychoeducational Group Note  Date:  02/19/2013 Time:  10:30 PM  Group Topic/Focus:  Wrap-Up Group:   The focus of this group is to help patients review their daily goal of treatment and discuss progress on daily workbooks.  Participation Level:  Active  Participation Quality:  Appropriate  Affect:  Appropriate  Cognitive:  Appropriate  Insight: Appropriate  Engagement in Group:  Engaged  Modes of Intervention:  Discussion  Additional Comments:  Pt stated she had a good day and got to hang out and talk with everyone here at [facility].  Caswell Corwin 02/19/2013, 10:30 PM

## 2013-02-19 NOTE — Progress Notes (Signed)
Patient ID: Shannon [name], female   DOB: 06/27/1990, 22 y.o.   MRN: [id number] [facility] MD Progress Note  02/19/2013 4:47 PM [name]  MRN:  [id number]  Diagnosis:  Shannon [name] continues to do well here on the unit. She is up and participating in groups. She reports no new symptoms. She is sleeping well and eating well. States her depression is a 0/10 as is her anxiety. Reports no side effects with the medication. Assessment:  DSM5:  Schizophrenia Disorders:  Obsessive-Compulsive Disorders:  Trauma-Stressor Disorders:  Substance/Addictive Disorders:  Depressive Disorders: Major Depressive Disorder - Severe (296.23)  AXIS I: Major Depression, Recurrent severe  AXIS II: Deferred  AXIS III:  Past Medical History   Diagnosis  Date   .  Medical history non-contributory     AXIS IV: economic problems, educational problems, other psychosocial or environmental problems, problems related to social environment and problems with primary support group  AXIS V: 41-50 serious symptoms   DADL's:  Intact  Sleep: Good  Appetite:  Good  Suicidal Ideation:  decreasing Homicidal Ideation:  denies AEB (as evidenced by):  Psychiatric Specialty Exam: Review of Systems  Constitutional: Negative.  Negative for fever, chills, weight loss, malaise/fatigue and diaphoresis.  HENT: Negative for congestion and sore throat.   Eyes: Negative for blurred vision, double vision and photophobia.  Respiratory: Negative for cough, shortness of breath and wheezing.   Cardiovascular: Negative for chest pain, palpitations and PND.  Gastrointestinal: Negative for heartburn, nausea, vomiting, abdominal pain, diarrhea and constipation.  Musculoskeletal: Negative for falls, joint pain and myalgias.  Neurological: Negative for dizziness, tingling, tremors, sensory change, speech change, focal weakness, seizures, loss of consciousness, weakness and headaches.  Endo/Heme/Allergies: Negative for polydipsia. Does not  bruise/bleed easily.  Psychiatric/Behavioral: Negative for depression, suicidal ideas, hallucinations, memory loss and substance abuse. The patient is not nervous/anxious and does not have insomnia.     Blood pressure 120/85, pulse 90, temperature 97.8 F (36.6 C), temperature source Oral, resp. rate 16, height 5\' 6"  (1.676 m), weight 114.42 kg (252 lb 4 oz), last menstrual period 02/12/2013.Body mass index is 40.73 kg/(m^2).  General Appearance: Fairly Groomed  Patent attorney::  Fair  Speech:  Clear and Coherent  Volume:  Decreased  Mood:  Angry and Depressed  Affect:  Flat  Thought Process:  Goal Directed  Orientation:  Full (Time, Place, and Person)  Thought Content:  WDL  Suicidal Thoughts:  Yes.  without intent/plan  Homicidal Thoughts:  No  Memory:  NA  Judgement:  Impaired  Insight:  Shallow  Psychomotor Activity:  Normal  Concentration:  Fair  Recall:  Fair  Akathisia:  No  Handed:  Right  AIMS (if indicated):     Assets:  Communication Skills Desire for Improvement Physical Health  Sleep:  Number of Hours: 6.75   Current Medications: Current Facility-Administered Medications  Medication Dose Route Frequency Provider Last Rate Last Dose  . acetaminophen (TYLENOL) tablet 650 mg  650 mg Oral Q6H PRN Kerry Hough, PA-C      . alum & mag hydroxide-simeth (MAALOX/MYLANTA) 200-200-20 MG/5ML suspension 30 mL  30 mL Oral Q4H PRN Kerry Hough, PA-C      . buPROPion (WELLBUTRIN XL) 24 hr tablet 150 mg  150 mg Oral Daily Nehemiah Settle, MD   150 mg at 02/19/13 7829  . magnesium hydroxide (MILK OF MAGNESIA) suspension 30 mL  30 mL Oral Daily PRN Kerry Hough, PA-C      . traZODone (DESYREL) tablet  50 mg  50 mg Oral QHS,MR X 1 Kerry Hough, PA-C        Lab Results:  No results found for this or any previous visit (from the past 48 hour(s)).  Physical Findings: AIMS: Facial and Oral Movements Muscles of Facial Expression: None, normal Lips and Perioral  Area: None, normal Jaw: None, normal Tongue: None, normal,Extremity Movements Upper (arms, wrists, hands, fingers): None, normal Lower (legs, knees, ankles, toes): None, normal, Trunk Movements Neck, shoulders, hips: None, normal, Overall Severity Severity of abnormal movements (highest score from questions above): None, normal Incapacitation due to abnormal movements: None, normal Patient's awareness of abnormal movements (rate only patient's report): No Awareness,    CIWA:    COWS:     Treatment Plan Summary: Daily contact with patient to assess and evaluate symptoms and progress in treatment Medication management  Plan: 1. Continue current plan of care with goal of D/C tomorrow if can contract for safety and no further complication.   Medical Decision Making Problem Points:  Established problem, stable/improving (1) Data Points:  Review or order clinical lab tests (1) Review of medication regiment & side effects (2)  I certify that inpatient services furnished can reasonably be expected to improve the patient's condition.  Rona Ravens. Mashburn RPAC 4:55 PM 02/19/2013  Attending Addendum:  I have discussed this patient with the above provider. I have reviewed the history, physical exam, assessment and plan and agree with the above.  Jacqulyn Cane, M.D.  02/19/2013 8:49 PM

## 2013-02-19 NOTE — Progress Notes (Signed)
Adult Psychoeducational Group Note  Date:  02/19/2013 Time:  1:15PM  Group Topic/Focus:  Therapeutic Activity  Participation Level:  Active  Participation Quality:  Appropriate  Affect:  Flat  Cognitive:  Appropriate  Insight: Appropriate  Engagement in Group:  Engaged  Modes of Intervention:  Activity  Additional Comments:  Pt was active in the group session, present for the bulk of the session minus a brief period where she went to speak with the physician. Pt participated without refusal and seemed to enjoy the activity.   Zacarias Pontes R 02/19/2013, 2:23 PM

## 2013-02-20 DIAGNOSIS — F332 Major depressive disorder, recurrent severe without psychotic features: Principal | ICD-10-CM

## 2013-02-20 NOTE — Progress Notes (Signed)
 .  Psychoeducational Group Note    Date: 02/20/2013 Time: 0930  Goal Setting Purpose of Group: To be able to set [name] goal that is measurable and that can be accomplished in one day Participation Level:  Active  Participation Quality:  Attentive  Affect:  Appropriate  Cognitive:  Oriented  Insight:  Improving  Engagement in Group:  Engaged  Additional Comments:   Shannon [name] 

## 2013-02-20 NOTE — Progress Notes (Signed)
Patient ID: Shannon [name], female   DOB: [birth year], 22 y.o.   MRN: [id number]  [facility] MD Progress Note  02/20/2013 2:30 PM Shannon [name]  MRN:  [id number]  Subjective:  Patient states "I've learned some new ways to cope. I am going to keep a journal and start running. My depression is a one. I'm just worried that I'm here so I can't start studying for my finals. They don't just let people into the dorms to get things."   Objective:  Patient is observed attending groups on the unit and appears invested in her treatment. She reports a low level of depression but her affect appeared very flat during conversation. Patient is complaint with her treatment and denies any adverse effects.   Diagnosis:  Assessment:  DSM5:  Schizophrenia Disorders:  Obsessive-Compulsive Disorders:  Trauma-Stressor Disorders:  Substance/Addictive Disorders:  Depressive Disorders: Major Depressive Disorder - Severe (296.23)  AXIS I: Major Depression, Recurrent severe  AXIS II: Deferred  AXIS III:  Past Medical History   Diagnosis  Date   .  Medical history non-contributory     AXIS IV: economic problems, educational problems, other psychosocial or environmental problems, problems related to social environment and problems with primary support group  AXIS V: 51-60 Moderate Symptoms   DADL's:  Intact  Sleep: Good  Appetite:  Good  Suicidal Ideation:  Denies Homicidal Ideation:  denies AEB (as evidenced by):  Psychiatric Specialty Exam: Review of Systems  Constitutional: Negative.  Negative for fever, chills, weight loss, malaise/fatigue and diaphoresis.  HENT: Negative for congestion and sore throat.   Eyes: Negative for blurred vision, double vision and photophobia.  Respiratory: Negative for cough, shortness of breath and wheezing.   Cardiovascular: Negative for chest pain, palpitations and PND.  Gastrointestinal: Negative for heartburn, nausea, vomiting, abdominal pain, diarrhea and constipation.   Genitourinary: Negative.   Musculoskeletal: Negative for falls, joint pain and myalgias.  Neurological: Negative for dizziness, tingling, tremors, sensory change, speech change, focal weakness, seizures, loss of consciousness, weakness and headaches.  Endo/Heme/Allergies: Negative for polydipsia. Does not bruise/bleed easily.  Psychiatric/Behavioral: Positive for depression. Negative for suicidal ideas, hallucinations, memory loss and substance abuse. The patient is not nervous/anxious and does not have insomnia.     Blood pressure 128/83, pulse 103, temperature 98 F (36.7 C), temperature source Oral, resp. rate 18, height 5\' 6"  (1.676 m), weight 114.42 kg (252 lb 4 oz), last menstrual period 02/12/2013.Body mass index is 40.73 kg/(m^2).  General Appearance: Fairly Groomed  Patent attorney::  Fair  Speech:  Clear and Coherent  Volume:  Decreased  Mood:  Angry and Depressed  Affect:  Flat  Thought Process:  Goal Directed  Orientation:  Full (Time, Place, and Person)  Thought Content:  WDL  Suicidal Thoughts:  Denies  Homicidal Thoughts:  No  Memory:  NA  Judgement:  Impaired  Insight:  Shallow  Psychomotor Activity:  Normal  Concentration:  Fair  Recall:  Fair  Akathisia:  No  Handed:  Right  AIMS (if indicated):     Assets:  Communication Skills Desire for Improvement Physical Health  Sleep:  Number of Hours: 6.75   Current Medications: Current Facility-Administered Medications  Medication Dose Route Frequency Provider Last Rate Last Dose  . acetaminophen (TYLENOL) tablet 650 mg  650 mg Oral Q6H PRN Kerry Hough, PA-C      . alum & mag hydroxide-simeth (MAALOX/MYLANTA) 200-200-20 MG/5ML suspension 30 mL  30 mL Oral Q4H PRN Kerry Hough, PA-C      .  buPROPion (WELLBUTRIN XL) 24 hr tablet 150 mg  150 mg Oral Daily Nehemiah Settle, MD   150 mg at 02/20/13 0828  . magnesium hydroxide (MILK OF MAGNESIA) suspension 30 mL  30 mL Oral Daily PRN Kerry Hough, PA-C       . traZODone (DESYREL) tablet 50 mg  50 mg Oral QHS,MR X 1 Kerry Hough, PA-C        Lab Results:  No results found for this or any previous visit (from the past 48 hour(s)).  Physical Findings: AIMS: Facial and Oral Movements Muscles of Facial Expression: None, normal Lips and Perioral Area: None, normal Jaw: None, normal Tongue: None, normal,Extremity Movements Upper (arms, wrists, hands, fingers): None, normal Lower (legs, knees, ankles, toes): None, normal, Trunk Movements Neck, shoulders, hips: None, normal, Overall Severity Severity of abnormal movements (highest score from questions above): None, normal Incapacitation due to abnormal movements: None, normal Patient's awareness of abnormal movements (rate only patient's report): No Awareness,    CIWA:    COWS:     Treatment Plan Summary: Daily contact with patient to assess and evaluate symptoms and progress in treatment Medication management  Plan: Continue crisis management and stabilization.  Medication management: Continue Wellbutrin XL 150 mg daily for depressive symptoms.  Encouraged patient to attend groups and participate in group counseling sessions and activities.  Discharge plan in progress. Possible d/c tomorrow if remains stable.  Continue current treatment plan.  Address health issues: Vitals reviewed and stable.   Medical Decision Making Problem Points:  Established problem, stable/improving (1) Data Points:  Review or order clinical lab tests (1) Review of medication regiment & side effects (2)  I certify that inpatient services furnished can reasonably be expected to improve the patient's condition.  Fransisca Kaufmann NP-C  2:30 PM 02/20/2013 Attending Addendum: I have discussed this patient with the above provider. I have reviewed the history, physical exam, assessment and plan and agree with the above.  Jacqulyn Cane, M.D.  02/20/2013 10:26 PM

## 2013-02-20 NOTE — Progress Notes (Signed)
Psychoeducational Group Note  Date: 02/20/2013 Time:  1015  Group Topic/Focus:  Identifying Needs:   The focus of this group is to help patients identify their personal needs that have been historically problematic and identify healthy behaviors to address their needs.  Participation Level:  Active  Participation Quality:  Appropriate  Affect:  Appropriate  Cognitive:  Appropriate  Insight:  Improving  Engagement in Group:  Engaged  Additional Comments:    Damare Serano A  

## 2013-02-20 NOTE — [facility] Group Notes (Signed)
[facility] Group Notes:  (Clinical Social Work)  02/20/2013   3:00-4:00PM  Summary of Progress/Problems:   The main focus of today's process group was for the patient to identify ways in which they have sabotaged their own mental health wellness/recovery.  Motivational interviewing was used to explore the reasons they engage in this behavior, and reasons they may have for wanting to change.  The Stages of Change were explained to the group using a handout, and patients identified where they are with regard to changing self-defeating behaviors.  The patient expressed that she self-sabotages with people pleasing.  Type of Therapy:  Process Group  Participation Level:  Active  Participation Quality:  Attentive  Affect:  Blunted and Depressed  Cognitive:  Appropriate  Insight:  Developing/Improving  Engagement in Therapy:  Engaged  Modes of Intervention:  Education, Motivational Interviewing   Ambrose Mantle, LCSW 02/20/2013, 4:00pm

## 2013-02-20 NOTE — Progress Notes (Signed)
[facility] Group Notes:  (Nursing/MHT/Case Management/Adjunct)  Date:  02/20/2013  Time:  2:34 PM  Type of Therapy:  Psychoeducational Skills  Participation Level:  Active  Participation Quality:  Appropriate  Affect:  Appropriate  Cognitive:  Appropriate  Insight:  Appropriate  Engagement in Group:  Engaged and Improving  Modes of Intervention:  Activity  Summary of Progress/Problems: Pt was quiet, but was engaged and actively participated.  Caswell Corwin 02/20/2013, 2:34 PM

## 2013-02-20 NOTE — Progress Notes (Signed)
Shannon [name] is seen OOB UAL on the 500 hall today. She remains quiet, subdued and keeps to herself. She avoids eye contact. SHe takes her meds as ordered and has not requested any prn medicine.   A She completed her AM self inventory and on it she wrote she rated her depression and hopelessness "1/1" and she wrote she denied SI within the past 24 hrs and that her DC plan is to " take meds, write, pray and read".   R Safety is in place and poc moves forward.

## 2013-02-20 NOTE — Progress Notes (Addendum)
D: Pt denies SI/HI/AVH. Pt is pleasant and cooperative. Pt stated things just piled on her. 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 Pt stated she would  Start writing in journal , staying positive and go back to church when she leaves.  A: Pt was offered support and encouragement. Pt was given scheduled medications. Pt was encourage to attend groups. Q 15 minute checks were done for safety.   R:Pt attends groups and interacts well with peers and staff. Pt is taking medication. Pt has no complaints at this time.Pt receptive to treatment and safety maintained on unit.

## 2013-02-21 MED ORDER — BUPROPION HCL ER (XL) 150 MG PO TB24: 150.0000 mg | ORAL_TABLET | Freq: Every day | ORAL | Status: DC

## 2013-02-21 MED ORDER — TRAZODONE HCL 50 MG PO TABS: 50.0000 mg | ORAL_TABLET | Freq: Every evening | ORAL | Status: DC | PRN

## 2013-02-21 NOTE — Progress Notes (Signed)
[facility] MD Progress Note  02/21/2013 10:31 AM Shannon [name]  MRN:  [id number] Subjective:  HPI Comments: Shannon [name] is  a  22 y/o female with a past psychiatric history significant for Major Depressive Disorder, recurrent, severe, without psychotic features . The patient is referred for psychiatric services for psychiatric evaluation and medication management.    . Location: The patient reports she has been feeling . Quality: The patient reports that her main stressors are:  "not passing in classes" -she reports that she is not putting in the effort as she feels she will not pass.  In the area of affective symptoms, patient appears mildly depressed. Patient denies current suicidal ideation, intent, or plan. Patient denies current homicidal ideation, intent, or plan. Patient denies auditory hallucinations. Patient denies visual hallucinations. Patient denies symptoms of paranoia. Patient states sleep is good, with approximately. Appetite is goo. Energy level is good Patient denies symptoms of anhedonia. Patient denies hopelessness, helplessness, or guilt.   . Severity: Mild  . Duration: Since the age of 28.  . Timing: Situational  . Context: arguments with friend; herself worth is determined by her friend's opinion of her.   . Modifying factors: Improved with journaling; being with other friends.   . Associated signs and symptoms (e.g., loss of appetite, loss of weight, loss of sexual interest)  Denies any recent episodes consistent with mania, particularly decreased need for sleep with increased energy, grandiosity, impulsivity, hyperverbal and pressured speech, or increased productivity. Denies any recent symptoms consistent with psychosis, particularly auditory or visual hallucinations, thought broadcasting/insertion/withdrawal, or ideas of reference. Also denies excessive worry to the point of physical symptoms as well as any panic attacks. Denies any history of trauma or symptoms consistent  with PTSD such as flashbacks, nightmares, hypervigilance, feelings of numbness or inability to connect with others.   Diagnosis:  Assessment:  DSM5:  Schizophrenia Disorders:  Obsessive-Compulsive Disorders:  Trauma-Stressor Disorders:  Substance/Addictive Disorders:  Depressive Disorders: Major Depressive Disorder - Severe (296.23)  AXIS I: Major Depression, Recurrent severe  AXIS II: Cluster C Traits AXIS III:  Past Medical History   Diagnosis  Date   .  Medical history non-contributory    AXIS IV: economic problems, educational problems, other psychosocial or environmental problems, problems related to social environment and problems with primary support group  AXIS V: 51-60 Moderate Symptoms   ADL's:  Intact  Sleep: Good  Appetite:  Good  Suicidal Ideation:  Plan:  Patient denies Intent:  patient denies Means:  Patient denies Homicidal Ideation:  Plan:  Patient denies Intent:  Patient denies Means:  Patient denies AEB (as evidenced by):  Psychiatric Specialty Exam: ROS  Blood pressure 130/86, pulse 86, temperature 97.8 F (36.6 C), temperature source Oral, resp. rate 16, height 5\' 6"  (1.676 m), weight 114.42 kg (252 lb 4 oz), last menstrual period 02/12/2013.Body mass index is 40.73 kg/(m^2).  General Appearance: Casual and Well Groomed  Eye Contact::  Absent  Speech:  Clear and Coherent and Normal Rate  Volume:  Normal  Mood:  "all right"  Affect:  Restricted  Thought Process:  Coherent, Linear and Logical  Orientation:  Full (Time, Place, and Person)  Thought Content:  WDL  Suicidal Thoughts:  No  Homicidal Thoughts:  No  Memory:  Immediate;   Good Recent;   Good Remote;   Good  Judgement:  Good  Insight:  Good  Psychomotor Activity:  Normal  Concentration:  Good  Recall:  Good  Akathisia:  No  Handed:  Right  AIMS (if indicated):   Not indicated  Assets:  Communication Skills Desire for Improvement Housing Leisure Time Physical Health  Sleep:   Number of Hours: 6.75   Current Medications: Current Facility-Administered Medications  Medication Dose Route Frequency Provider Last Rate Last Dose  . acetaminophen (TYLENOL) tablet 650 mg  650 mg Oral Q6H PRN Kerry Hough, PA-C      . alum & mag hydroxide-simeth (MAALOX/MYLANTA) 200-200-20 MG/5ML suspension 30 mL  30 mL Oral Q4H PRN Kerry Hough, PA-C      . buPROPion (WELLBUTRIN XL) 24 hr tablet 150 mg  150 mg Oral Daily Nehemiah Settle, MD   150 mg at 02/21/13 0804  . magnesium hydroxide (MILK OF MAGNESIA) suspension 30 mL  30 mL Oral Daily PRN Kerry Hough, PA-C      . traZODone (DESYREL) tablet 50 mg  50 mg Oral QHS,MR X 1 Kerry Hough, PA-C        Lab Results: No results found for this or any previous visit (from the past 48 hour(s)).  Physical Findings: AIMS: Facial and Oral Movements Muscles of Facial Expression: None, normal Lips and Perioral Area: None, normal Jaw: None, normal Tongue: None, normal,Extremity Movements Upper (arms, wrists, hands, fingers): None, normal Lower (legs, knees, ankles, toes): None, normal, Trunk Movements Neck, shoulders, hips: None, normal, Overall Severity Severity of abnormal movements (highest score from questions above): None, normal Incapacitation due to abnormal movements: None, normal Patient's awareness of abnormal movements (rate only patient's report): No Awareness,    CIWA:    COWS:     Treatment Plan Summary: Will home today.  Plan: Will discharge  Home today  On  Wellbutrin XL 150 mg. Discontinue trazodone as she is not using this medication.  Medical Decision Making Problem Points:  Established problem, stable/improving (1) and Review of psycho-social stressors (1) Data Points:  Review or order medicine tests (1) Review of medication regiment & side effects (2)  I certify that inpatient services furnished can reasonably be expected to improve the patient's condition.   Shannon [name] 02/21/2013,  10:31 AM

## 2013-02-21 NOTE — [facility] Suicide Risk Assessment (Signed)
Suicide Risk Assessment  Discharge Assessment     Demographic Factors:  Adolescent or young adult  Mental Status Per Nursing Assessment::   On Admission:  NA  Current Mental Status by Physician: Psychiatric Specialty Exam: ROS  Blood pressure 130/86, pulse 86, temperature 97.8 F (36.6 C), temperature source Oral, resp. rate 16, height 5\' 6"  (1.676 m), weight 114.42 kg (252 lb 4 oz), last menstrual period 02/12/2013.Body mass index is 40.73 kg/(m^2).  General Appearance: Casual and Well Groomed  Eye Contact::  Absent  Speech:  Clear and Coherent and Normal Rate  Volume:  Normal  Mood:  "all right"  Affect:  Restricted  Thought Process:  Coherent, Linear and Logical  Orientation:  Full (Time, Place, and Person)  Thought Content:  WDL  Suicidal Thoughts:  No  Homicidal Thoughts:  No  Memory:  Immediate;   Good Recent;   Good Remote;   Good  Judgement:  Good  Insight:  Good  Psychomotor Activity:  Normal  Concentration:  Good  Recall:  Good  Akathisia:  No  Handed:  Right  AIMS (if indicated):   Not indicated  Assets:  Communication Skills Desire for Improvement Housing Leisure Time Physical Health  Sleep:  Number of Hours: 6.75   Loss Factors: NA  Historical Factors: NA  Risk Reduction Factors:   Positive social support and Positive coping skills or problem solving skills  Continued Clinical Symptoms:  Depression:   Recent sense of peace/wellbeing  Cognitive Features That Contribute To Risk:  Thought constriction (tunnel vision)    Suicide Risk:  Minimal: No identifiable suicidal ideation.  Patients presenting with no risk factors but with morbid ruminations; may be classified as minimal risk based on the severity of the depressive symptoms  Discharge Diagnoses: Diagnosis:  Assessment:  DSM5:  Schizophrenia Disorders:  Obsessive-Compulsive Disorders:  Trauma-Stressor Disorders:  Substance/Addictive Disorders:  Depressive Disorders: Major Depressive  Disorder - Severe (296.23)  AXIS I: Major Depression, Recurrent severe  AXIS II: Cluster C Traits  AXIS III:  Past Medical History   Diagnosis  Date   .  Medical history non-contributory    AXIS IV: economic problems, educational problems, other psychosocial or environmental problems, problems related to social environment and problems with primary support group  AXIS V: 51-60 Moderate Symptoms    Plan Of Care/Follow-up recommendations:  Activity:  Increase as tolerated Diet:  regular diet Tests:  None Other:  Follow up with outpatient psychitrist and counseling.  Is patient on multiple antipsychotic therapies at discharge:  No   Has Patient had three or more failed trials of antipsychotic monotherapy by history:  No  Recommended Plan for Multiple Antipsychotic Therapies: NA  Theo Reither 02/21/2013, 11:10 AM

## 2013-02-21 NOTE — Progress Notes (Signed)
[facility] Group Notes:  (Nursing/MHT/Case Management/Adjunct)  Date:  02/21/2013  Time:  8:00 p.m.   Type of Therapy:  Psychoeducational Skills  Participation Level:  Minimal  Participation Quality:  Resistant  Affect:  Flat  Cognitive:  Appropriate  Insight:  Lacking  Engagement in Group:  Lacking  Modes of Intervention:  Education  Summary of Progress/Problems: The patient shared very little in group this evening about her day. She would only say that she played card games with her peers today. As a goal for tomorrow, she intends to work on finding some coping skills.   Einer Meals S 02/21/2013, 12:06 AM

## 2013-02-21 NOTE — Progress Notes (Signed)
[facility] Group Notes:  (Nursing/MHT/Case Management/Adjunct)  Date:  02/21/2013  Time:  3:16 PM  Type of Therapy:  Psychoeducational Skills  Participation Level:  Active  Participation Quality:  Appropriate  Affect:  Appropriate  Cognitive:  Appropriate  Insight:  Appropriate and Good  Engagement in Group:  Engaged  Modes of Intervention:  Activity  Summary of Progress/Problems:  Shannon [name] 02/21/2013, 3:16 PM

## 2013-02-21 NOTE — Progress Notes (Signed)
[facility] Adult Case Management Discharge Plan :  Will you be returning to the same living situation after discharge: Yes,  as noted by weekday CSW At discharge, do you have transportation home?:Yes,  to be picked up Do you have the ability to pay for your medications:Yes,  as discussed with weekday CSW  Release of information consent forms completed and in the chart;  Patient's signature needed at discharge.  Patient to Follow up at: Follow-up Information   Follow up with [facility] On 02/23/2013. (Call [facility] upon discharge and ask to talk with Georgeann Oppenheim [phone]).  Appointment scheduled at 2:00 pm with Precious Haws for therapy.  This is a 30 minute appointment. )    Contact information:   [address] [place], [place] [address] Phone: [phone] Fax: [phone]      Follow up with [facility] On 02/26/2013. (Appointment scheduled at 1:20 pm with Dr. Alphia Kava for medication management)    Contact information:   [address] [place], [place] [address] Phone: [phone] Fax: [phone]      Patient denies SI/HI:   Yes,  with doctor    Safety Planning and Suicide Prevention discussed:  Yes,  has been provided in aftercare planning groups  Sarina Ser 02/21/2013, 12:54 PM

## 2013-02-21 NOTE — Progress Notes (Signed)
Nrsg DC MD completed DC order and DC SRA in pt's chart. Pt given DC AVS< stated she understood these instructions and can and will comply. She was given DC prescriptions and sample meds from pharmacist also. She denies SI ,  she rates her depression and hopelessensss "1/1" and she denies HI. She shares that her DC plan is to begin daily journaling and counseling. All belongings were returned to her  And she was escorted to bldg entrance and DC'd

## 2013-02-21 NOTE — Progress Notes (Signed)
Psychoeducational Group Note  Date: 02/21/2013 Time:  0930 Group Topic/Focus:  Gratefulness:  The focus of this group is to help patients identify what two things they are most grateful for in their lives. What helps ground them and to center them on their work to their recovery.  Participation Level:  Active  Participation Quality:  Appropriate  Affect:  Appropriate  Cognitive:  Oriented  Insight:  Improving  Engagement in Group:  Engaged  Additional Comments:    Emilija Bohman A  

## 2013-02-21 NOTE — Progress Notes (Signed)
Patient ID: Shannon [name], female   DOB: 09/23/1990, 22 y.o.   MRN: [id number] D)  Was rather quiet, but attended group this evening, interacting witjh select peers, somewhat guarded with staff.  Stated she had a good day overall, her goal is to keep working on her coping skills.  A)  Will continue to monitor for safety, continue POC R)  Safety maintained.

## 2013-02-21 NOTE — Discharge Summary (Signed)
Physician Discharge Summary Note  Patient:  Shannon [name] is an 22 y.o., female MRN:  [id number] DOB:  01-Sep-1990 Patient phone:  [phone] (home)  Patient address:   [address] [address],   Date of Admission:  02/16/2013 Date of Discharge: 02/21/13  Reason for Admission:  Depression with SI  Discharge Diagnoses: Principal Problem:   Suicide ideation Active Problems:   Major depressive disorder, recurrent episode, severe degree, without mention of psychotic behavior  Review of Systems  Constitutional: Negative.   HENT: Negative.   Eyes: Negative.   Respiratory: Negative.   Cardiovascular: Negative.   Gastrointestinal: Negative.   Genitourinary: Negative.   Musculoskeletal: Negative.   Skin: Negative.   Neurological: Negative.   Endo/Heme/Allergies: Negative.   Psychiatric/Behavioral: Positive for depression. Negative for suicidal ideas, hallucinations, memory loss and substance abuse. The patient is not nervous/anxious and does not have insomnia.     DSM5: Axis Diagnosis:  Schizophrenia Disorders:  Obsessive-Compulsive Disorders:  Trauma-Stressor Disorders:  Substance/Addictive Disorders:  Depressive Disorders: Major Depressive Disorder - Severe (296.23)  AXIS I: Major Depression, Recurrent severe  AXIS II: Cluster C Traits  AXIS III:  Past Medical History   Diagnosis  Date   .  Medical history non-contributory    AXIS IV: economic problems, educational problems, other psychosocial or environmental problems, problems related to social environment and problems with primary support group  AXIS V: 51-60 Moderate Symptoms   Level of Care:  OP  Hospital Course:  Shannon [name] is an 22 y.o. 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 Patient was brought in  to the [place] [facility] emergency department by [employer]. The patient reports that she texted her friend telling her she was going to commit suicide and that it was her plan to cut her wrists and she had a knife in her presence. Patient presents with a depressed mood, flat blunted affect, soft and slow speech. She has been tearfulness, fatigue, lack of interest in usual pleasures, feelings of guilt and worthlessness, decreased concentration, and short term memory. She is overwhelmed by school, difficulty expressing her feelings and does endorse a history of emotional abuse, and also reported recently about 2 weeks she has been isolating herself and distancing herself from the friends. She reports an increase in sleep around 11 hours per night and decrease in grooming. patient has been in mental health counseling at [name] department in the past and currently participating in counseling at [facility] psychology Department. She denies homicidal ideation and auditory and visual hallucinations as well as substance abuse or any previous history of substance abuse. patient has no previous acute psychiatric hospitalizations. Patient was recently her aunt who she calls mom, who lives in [place] and have by less than mother grandmother and cousins lives 20 miles away from her aunt home but does not have a good contact or relationship.          Shannon [name] was admitted to the adult unit where she was evaluated and her symptoms were identified. Medication management was discussed and implemented. She was encouraged to participate in unit programming. Medical problems were identified and treated appropriately. Home medication was restarted as needed.                Shannon [name] was evaluated each day by a clinical provider  to ascertain the patient's response to treatment.  Improvement was noted by the patient's report of decreasing symptoms, improved sleep and appetite, affect, medication tolerance,  behavior, and participation in unit programming.  Shannon Manleywas asked each day to complete a self inventory noting mood, mental status, pain, new symptoms, anxiety and concerns.         She responded well to medication and being in a therapeutic and supportive environment. Positive and appropriate behavior was noted and the patient was motivated for recovery.  Shannon Manleyworked closely with the treatment team and case manager to develop a discharge plan with appropriate goals. Coping skills, problem solving as well as relaxation therapies were also part of the unit programming.         By the day of discharge Shannon [name] was in much improved condition than upon admission.  Symptoms were reported as significantly decreased or resolved completely.  The patient denied SI/HI and voiced no AVH. She was motivated to continue taking medication with a goal of continued improvement in mental health.          Shannon [name] was discharged home with a plan to follow up as noted below.  Consults:  None  Significant Diagnostic Studies:  labs: Admission labs reviewed and completed   Discharge Vitals:   Blood pressure 130/86, pulse 86, temperature 97.8 F (36.6 C), temperature source Oral, resp. rate 16, height 5\' 6"  (1.676 m), weight 114.42 kg (252 lb 4 oz), last menstrual period 02/12/2013. Body mass index is 40.73 kg/(m^2). Lab Results:   No results found for this or any previous visit (from the past 72 hour(s)).  Physical Findings: AIMS: Facial and Oral Movements Muscles of Facial Expression: None, normal Lips and Perioral Area: None, normal Jaw: None, normal Tongue: None, normal,Extremity Movements Upper (arms, wrists, hands, fingers): None, normal Lower (legs, knees, ankles, toes): None, normal, Trunk Movements Neck, shoulders, hips: None, normal, Overall Severity Severity of abnormal movements (highest score from questions above): None, normal Incapacitation due to abnormal movements: None,  normal Patient's awareness of abnormal movements (rate only patient's report): No Awareness,    CIWA:    COWS:     Psychiatric Specialty Exam: See Psychiatric Specialty Exam and Suicide Risk Assessment completed by Attending Physician prior to discharge.  Discharge destination:  Home  Is patient on multiple antipsychotic therapies at discharge:  No   Has Patient had three or more failed trials of antipsychotic monotherapy by history:  No  Recommended Plan for Multiple Antipsychotic Therapies: NA     Medication List       Indication   buPROPion 150 MG 24 hr tablet  Commonly known as:  WELLBUTRIN XL  Take 1 tablet (150 mg total) by mouth daily. For Depression.   Indication:  Major Depressive Disorder     traZODone 50 MG tablet  Commonly known as:  DESYREL  Take 1 tablet (50 mg total) by mouth at bedtime and may repeat dose one time if needed.   Indication:  Trouble Sleeping           Follow-up Information   Follow up with [facility] On 02/23/2013. (Call [facility] upon discharge and ask to talk with Georgeann Oppenheim [phone]).  Appointment scheduled at 2:00 pm with Precious Haws for therapy.  This is a 30 minute appointment. )    Contact information:   [address] [place], [place] [address] Phone: [phone] Fax: [phone]      Follow up with [facility] On  02/26/2013. (Appointment scheduled at 1:20 pm with Dr. Alphia Kava for medication management)    Contact information:   [address] [place], [place] [address] Phone: [phone] Fax: [phone]      Follow-up recommendations:   Activity: Increase as tolerated  Diet: regular diet  Tests: None  Other: Follow up with outpatient psychitrist and counseling.  Comments:    Take all your medications as prescribed by your mental healthcare provider.  Report any adverse effects and or reactions from your medicines to your outpatient provider promptly.  Patient is instructed and cautioned to  not engage in alcohol and or illegal drug use while on prescription medicines.  In the event of worsening symptoms, patient is instructed to call the crisis hotline, 911 and or go to the nearest ED for appropriate evaluation and treatment of symptoms.  Follow-up with your primary care provider for your other medical issues, concerns and or health care needs.   Total Discharge Time:  Greater than 30 minutes.  SignedFransisca Kaufmann NP-C 02/21/2013, 11:25 AM  Jacqulyn Cane, M.D.  02/21/2013 11:52 PM

## 2013-02-21 NOTE — [facility] Group Notes (Signed)
[facility] Group Notes:  (Clinical Social Work)  02/21/2013   1:15-2:20PM  Summary of Progress/Problems:   The main focus of today's process group was to discuss about what constitutes a healthy support versus an unhealthy support, and to generate ideas on increasing supports.  An emphasis was placed on using counselor, doctor, therapy groups, 12-step groups, and problem-specific support groups to expand supports.  Several patients were quite disruptive, turning the subject again and again, so much of group was spent in redirecting.  Type of Therapy:  Process Group  Participation Level:  Active  Participation Quality:  Attentive and Sharing  Affect:  Blunted  Cognitive:  Appropriate and Oriented  Insight:  Engaged  Engagement in Therapy:  Engaged  Modes of Intervention:  Education,  Support and Processing  Dhruvi Crenshaw Grossman-Orr, LCSW 02/21/2013, 4:00pm   

## 2013-02-21 NOTE — Progress Notes (Signed)
Psychoeducational Group Note  Date:  02/21/2013 Time:  1015  Group Topic/Focus:  Making Healthy Choices:   The focus of this group is to help patients identify negative/unhealthy choices they were using prior to admission and identify positive/healthier coping strategies to replace them upon discharge.  Participation Level:  Active  Participation Quality:  Appropriate  Affect:  Appropriate  Cognitive:  Oriented  Insight:  Improving  Engagement in Group:  Engaged  Additional Comments:    Blas Riches A 02/21/2013  

## 2013-02-24 NOTE — Progress Notes (Signed)
Patient Discharge Instructions:  After Visit Summary (AVS):   Faxed to:  02/24/13 Discharge Summary Note:   Faxed to:  02/24/13 Psychiatric Admission Assessment Note:   Faxed to:  02/24/13 Suicide Risk Assessment - Discharge Assessment:   Faxed to:  02/24/13 Faxed/Sent to the Next Level Care provider:  02/24/13 Faxed to [facility] @ [phone]  Jerelene Redden, 02/24/2013, 3:26 PM

## 2015-09-12 DIAGNOSIS — D649 Anemia, unspecified: Secondary | ICD-10-CM | POA: Diagnosis not present

## 2015-09-12 DIAGNOSIS — Z13220 Encounter for screening for lipoid disorders: Secondary | ICD-10-CM | POA: Diagnosis not present

## 2015-09-12 DIAGNOSIS — Z131 Encounter for screening for diabetes mellitus: Secondary | ICD-10-CM | POA: Diagnosis not present

## 2015-09-12 DIAGNOSIS — Z0001 Encounter for general adult medical examination with abnormal findings: Secondary | ICD-10-CM | POA: Diagnosis not present

## 2015-12-13 DIAGNOSIS — D509 Iron deficiency anemia, unspecified: Secondary | ICD-10-CM | POA: Diagnosis not present

## 2015-12-15 DIAGNOSIS — K625 Hemorrhage of anus and rectum: Secondary | ICD-10-CM | POA: Diagnosis not present

## 2015-12-15 DIAGNOSIS — Z01419 Encounter for gynecological examination (general) (routine) without abnormal findings: Secondary | ICD-10-CM | POA: Diagnosis not present

## 2015-12-25 DIAGNOSIS — Z713 Dietary counseling and surveillance: Secondary | ICD-10-CM | POA: Diagnosis not present

## 2015-12-31 ENCOUNTER — Encounter (HOSPITAL_COMMUNITY): Payer: Self-pay | Admitting: Nurse Practitioner

## 2015-12-31 ENCOUNTER — Emergency Department (HOSPITAL_COMMUNITY)
Admission: EM | Admit: 2015-12-31 | Discharge: 2016-01-01 | Disposition: A | Discharge status: Home / Self Care | Payer: BLUE CROSS/BLUE SHIELD | Attending: Emergency Medicine | Admitting: Emergency Medicine

## 2015-12-31 DIAGNOSIS — F330 Major depressive disorder, recurrent, mild: Secondary | ICD-10-CM | POA: Diagnosis not present

## 2015-12-31 DIAGNOSIS — Z79899 Other long term (current) drug therapy: Secondary | ICD-10-CM | POA: Insufficient documentation

## 2015-12-31 DIAGNOSIS — F32A Depression, unspecified: Secondary | ICD-10-CM

## 2015-12-31 DIAGNOSIS — F329 Major depressive disorder, single episode, unspecified: Secondary | ICD-10-CM | POA: Diagnosis present

## 2015-12-31 HISTORY — DX: Major depressive disorder, single episode, unspecified: F32.9

## 2015-12-31 HISTORY — DX: Depression, unspecified: F32.A

## 2015-12-31 LAB — COMPREHENSIVE METABOLIC PANEL
ALBUMIN: 4.2 g/dL (ref 3.5–5.0)
ALK PHOS: 88 U/L (ref 38–126)
ALT: 14 U/L (ref 14–54)
ANION GAP: 8 (ref 5–15)
AST: 22 U/L (ref 15–41)
BILIRUBIN TOTAL: 0.8 mg/dL (ref 0.3–1.2)
BUN: 9 mg/dL (ref 6–20)
CO2: 25 mmol/L (ref 22–32)
Calcium: 9.1 mg/dL (ref 8.9–10.3)
Chloride: 107 mmol/L (ref 101–111)
Creatinine, Ser: 0.91 mg/dL (ref 0.44–1.00)
GFR calc non Af Amer: >60 mL/min (ref >60)
GLUCOSE: 110 mg/dL — AB (ref 65–99)
POTASSIUM: 3.2 mmol/L — AB (ref 3.5–5.1)
Sodium: 140 mmol/L (ref 135–145)
TOTAL PROTEIN: 8.4 g/dL — AB (ref 6.5–8.1)

## 2015-12-31 LAB — CBC
HCT: 37.5 % (ref 36.0–46.0)
Hemoglobin: 11.7 g/dL — ABNORMAL LOW (ref 12.0–15.0)
MCH: 24.8 pg — ABNORMAL LOW (ref 26.0–34.0)
MCHC: 31.2 g/dL (ref 30.0–36.0)
MCV: 79.6 fL (ref 78.0–100.0)
Platelets: 392 10*3/uL (ref 150–400)
RBC: 4.71 MIL/uL (ref 3.87–5.11)
RDW: 16.1 % — ABNORMAL HIGH (ref 11.5–15.5)
WBC: 4.6 10*3/uL (ref 4.0–10.5)

## 2015-12-31 LAB — RAPID URINE DRUG SCREEN, HOSP PERFORMED
Amphetamines: NOT DETECTED
BARBITURATES: NOT DETECTED
Benzodiazepines: NOT DETECTED
COCAINE: NOT DETECTED
Opiates: NOT DETECTED
TETRAHYDROCANNABINOL: NOT DETECTED

## 2015-12-31 LAB — ETHANOL: Alcohol, Ethyl (B): <5 mg/dL (ref <5)

## 2015-12-31 LAB — ACETAMINOPHEN LEVEL

## 2015-12-31 LAB — SALICYLATE LEVEL

## 2015-12-31 MED ORDER — FLUOXETINE HCL 20 MG PO CAPS
40.0000 mg | ORAL_CAPSULE | Freq: Every day | ORAL | Status: DC
  Administered 2015-12-31 – 2016-01-01 (×2): 40 mg via ORAL
  Filled 2015-12-31 (×2): qty 2

## 2015-12-31 MED ORDER — ACETAMINOPHEN 325 MG PO TABS: 650.0000 mg | ORAL_TABLET | ORAL | Status: DC | PRN

## 2015-12-31 MED ORDER — LEVONORGESTREL-ETHINYL ESTRAD 0.1-20 MG-MCG PO TABS: 1.0000 | ORAL_TABLET | ORAL | Status: DC

## 2015-12-31 MED ORDER — IBUPROFEN 200 MG PO TABS: 600.0000 mg | ORAL_TABLET | Freq: Three times a day (TID) | ORAL | Status: DC | PRN

## 2015-12-31 NOTE — ED Provider Notes (Signed)
[facility] DEPT Provider Note   CSN: [id number] Arrival date & time: 12/31/15  2026     History   Chief Complaint Chief Complaint  Patient presents with  . Suicidal    HPI Shannon [name] is a 25 y.o. female.  The history is provided by the patient.  Patient presents with depression and suicidal thoughts. His been going for the last week or 2. History of depression and previous suicide attempt. States that she has no active suicidal plan. She has a therapist or psychiatrist that she has an appointment to see next Friday. States she has just gotten worse. She denies substance abuse. Denies hallucinations.  Past Medical History:  Diagnosis Date  . Depression   . Medical history non-contributory     Patient Active Problem List   Diagnosis Date Noted  . Major depressive disorder, recurrent episode, severe degree, without mention of psychotic behavior 02/16/2013  . Suicide ideation 02/16/2013    Past Surgical History:  Procedure Laterality Date  . NO PAST SURGERIES      OB History    No data available       Home Medications    Prior to Admission medications   Medication Sig Start Date End Date Taking? Authorizing Provider  Ferrous Sulfate Dried 45 MG TBCR Take 1 tablet by mouth every morning.   Yes Historical Provider, MD  FLUoxetine (PROZAC) 40 MG capsule Take 40 mg by mouth every morning.   Yes Historical Provider, MD  levonorgestrel-ethinyl estradiol (AVIANE,ALESSE,LESSINA) 0.1-20 MG-MCG tablet Take 1 tablet by mouth every morning.   Yes Historical Provider, MD  buPROPion (WELLBUTRIN XL) 150 MG 24 hr tablet Take 1 tablet (150 mg total) by mouth daily. For Depression. Patient not taking: Reported on 12/31/2015 02/21/13   Niel Hummer, NP  traZODone (DESYREL) 50 MG tablet Take 1 tablet (50 mg total) by mouth at bedtime and may repeat dose one time if needed. Patient not taking: Reported on 12/31/2015 02/21/13   Niel Hummer, NP    Family History History  reviewed. No pertinent family history.  Social History Social History  Substance Use Topics  . Smoking status: Never Smoker  . Smokeless tobacco: Never Used  . Alcohol use No     Allergies   Bupropion   Review of Systems Review of Systems  Constitutional: Negative for appetite change.  Respiratory: Negative for chest tightness.   Gastrointestinal: Negative for abdominal pain.  Genitourinary: Negative for flank pain.  Musculoskeletal: Negative for back pain.  Skin: Negative for rash.  Neurological: Negative for light-headedness.  Hematological: Negative for adenopathy.     Physical Exam Updated Vital Signs BP 148/93 (BP Location: Right Arm)   Pulse 86   Temp 98.7 F (37.1 C) (Oral)   Resp 18   LMP 12/11/2015   SpO2 100%   Physical Exam  Constitutional: She appears well-developed.  HENT:  Head: Atraumatic.  Neck: Neck supple.  Cardiovascular: Normal rate.   Pulmonary/Chest: Effort normal.  Abdominal: Soft.  Neurological: She is alert.  Skin: Skin is warm. Capillary refill takes less than 2 seconds.  Psychiatric:  Somewhat flat affect.     ED Treatments / Results  Labs (all labs ordered are listed, but only abnormal results are displayed) Labs Reviewed  COMPREHENSIVE METABOLIC PANEL - Abnormal; Notable for the following:       Result Value   Potassium 3.2 (*)    Glucose, Bld 110 (*)    Total Protein 8.4 (*)    All other  components within normal limits  ACETAMINOPHEN LEVEL - Abnormal; Notable for the following:    Acetaminophen (Tylenol), Serum <10 (*)    All other components within normal limits  CBC - Abnormal; Notable for the following:    Hemoglobin 11.7 (*)    MCH 24.8 (*)    RDW 16.1 (*)    All other components within normal limits  ETHANOL  SALICYLATE LEVEL  URINE RAPID DRUG SCREEN, HOSP PERFORMED    EKG  EKG Interpretation None       Radiology No results found.  Procedures Procedures (including critical care  time)  Medications Ordered in ED Medications  acetaminophen (TYLENOL) tablet 650 mg (not administered)  ibuprofen (ADVIL,MOTRIN) tablet 600 mg (not administered)  FLUoxetine (PROZAC) capsule 40 mg (40 mg Oral Given 12/31/15 2259)  levonorgestrel-ethinyl estradiol (AVIANE,ALESSE,LESSINA) 0.1-20 MG-MCG per tablet 1 tablet (not administered)     Initial Impression / Assessment and Plan / ED Course  I have reviewed the triage vital signs and the nursing notes.  Pertinent labs & imaging results that were available during my care of the patient were reviewed by me and considered in my medical decision making (see chart for details).  Clinical Course    Patient with depression and some's passive suicidal thoughts. Medically cleared. To be seen by TTS.  Final Clinical Impressions(s) / ED Diagnoses   Final diagnoses:  Depression, unspecified depression type    New Prescriptions New Prescriptions   No medications on file     Davonna Belling, MD 12/31/15 2335

## 2015-12-31 NOTE — ED Notes (Signed)
Pt placed into purple paper scrubs and belongings secured near nurses station. Pt wanded by security

## 2015-12-31 NOTE — ED Notes (Signed)
SBAR Report received from previous nurse. Pt received calm and visible on unit. Pt denies current SI/ HI, A/V H, depression, anxiety, and pain at this time, and is otherwise stable. Pt contracted for safety. Pt reminded of camera surveillance, q 15 min rounds, and rules of the milieu. Pt screened for contraband by Probation officer, will continue to assess.

## 2015-12-31 NOTE — ED Notes (Signed)
Bed: WTR5 Expected date:  Expected time:  Means of arrival:  Comments: 

## 2015-12-31 NOTE — ED Triage Notes (Signed)
Pt states she has behaving suicidal thoughts since last week, denies having a plan, denies AV hallucinations and endorses taking her prozac as prescribed. States that she has been having problems with her friend.

## 2016-01-01 DIAGNOSIS — Z79899 Other long term (current) drug therapy: Secondary | ICD-10-CM | POA: Diagnosis not present

## 2016-01-01 DIAGNOSIS — F330 Major depressive disorder, recurrent, mild: Secondary | ICD-10-CM | POA: Diagnosis present

## 2016-01-01 MED ORDER — HYDROXYZINE HCL 50 MG PO TABS: 50.0000 mg | ORAL_TABLET | Freq: Two times a day (BID) | ORAL | 0 refills | Status: DC | PRN

## 2016-01-01 MED ORDER — HYDROXYZINE HCL 25 MG PO TABS: 50.0000 mg | ORAL_TABLET | Freq: Two times a day (BID) | ORAL | Status: DC | PRN

## 2016-01-01 NOTE — Progress Notes (Addendum)
Entered in d/c instructions  Patient has had bad headaches with no treatment.  The psychiatrist would like her to go to a neurologist since these are so bad they prompt suicidal ideations.  Referral requested.  Waylan Boga, [phone] Please use the list of in network [employer] providers to find a neurologist for follow care for your headaches     Please 1) call to make an appointment per your schedule 2) use the referral information provided by ED Nurse practitioner 3) if a pcp referral needed use [employer] student health doctor 4) Speak with the neurology office billing office to see if you hav...   Next Steps: Schedule an appointment as soon as possible for a visit on 01/01/2016  Pt to make an appt for neurology after consulting her class schedule  Teach back method used Pt able to inform Cm she will call to make an appt to be seen based on her class schedule and inform neurology office she has been referred by [facility] NP/MD for headaches and if she needs a pcp referral she will consult the [facility] student center doctor and then speak with neurology billing office to see what her responsibility will be for payment
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
## 2016-01-01 NOTE — ED Notes (Signed)
Pt discharged home. Discharged instructions read to pt who verbalized understanding. All belongings returned to pt who signed for same. Denies SI/HI, is not delusional and not responding to internal stimuli. Escorted pt to the ED exit.    

## 2016-01-01 NOTE — Progress Notes (Signed)
Patient has had bad headaches with no treatment.  The psychiatrist would like her to go to a neurologist since these are so bad they prompt suicidal ideations.  Referral requested.  Waylan Boga, PMH-NP

## 2016-01-01 NOTE — B[facility] Suicide Risk Assessment (Signed)
Suicide Risk Assessment  Discharge Assessment   [facility] Discharge Suicide Risk Assessment   Principal Problem: Major depressive disorder, recurrent episode, mild ([place]) Discharge Diagnoses:  Patient Active Problem List   Diagnosis Date Noted  . Major depressive disorder, recurrent episode, mild ([place]) [F33.0] 01/01/2016    Priority: High  . Suicide ideation [R45.851] 02/16/2013    Total Time spent with patient: 45 minutes  Musculoskeletal: Strength & Muscle Tone: within normal limits Gait & Station: normal Patient leans: N/A  Psychiatric Specialty Exam: Physical Exam  Constitutional: She is oriented to person, place, and time. She appears well-developed and well-nourished.  HENT:  Head: Normocephalic.  Neck: Normal range of motion.  Respiratory: Effort normal.  GI: Soft.  Musculoskeletal: Normal range of motion.  Neurological: She is alert and oriented to person, place, and time.  Skin: Skin is warm and dry.  Psychiatric: Her speech is normal and behavior is normal. Judgment and thought content normal. Cognition and memory are normal. She exhibits a depressed mood.    Review of Systems  Constitutional: Negative.   HENT: Negative.   Eyes: Negative.   Respiratory: Negative.   Cardiovascular: Negative.   Gastrointestinal: Negative.   Genitourinary: Negative.   Musculoskeletal: Negative.   Skin: Negative.   Neurological: Negative.   Endo/Heme/Allergies: Negative.   Psychiatric/Behavioral: Positive for depression.    Blood pressure 140/83, pulse 66, temperature 97.9 F (36.6 C), temperature source Oral, resp. rate 18, last menstrual period 12/11/2015, SpO2 100 %.There is no height or weight on file to calculate BMI.  General Appearance: Casual  Eye Contact:  Good  Speech:  Normal Rate  Volume:  Normal  Mood:  Depressed, mild  Affect:  Congruent  Thought Process:  Coherent and Descriptions of Associations: Intact  Orientation:  Full (Time, Place, and Person)  Thought  Content:  WDL  Suicidal Thoughts:  No  Homicidal Thoughts:  No  Memory:  Immediate;   Good Recent;   Good Remote;   Good  Judgement:  Good  Insight:  Good  Psychomotor Activity:  Normal  Concentration:  Concentration: Good and Attention Span: Good  Recall:  Good  Fund of Knowledge:  Good  Language:  Good  Akathisia:  No  Handed:  Right  AIMS (if indicated):     Assets:  Leisure Time Physical Health Resilience Social Support  ADL's:  Intact  Cognition:  WNL  Sleep:      Mental Status Per Nursing Assessment::   On Admission:   depression with suicidal ideations  Demographic Factors:  Adolescent or young adult  Loss Factors: NA  Historical Factors: NA  Risk Reduction Factors:   Sense of responsibility to family, Living with another person, especially a relative, Positive social support and Positive therapeutic relationship  Continued Clinical Symptoms:  Depression, mild  Cognitive Features That Contribute To Risk:  None    Suicide Risk:  Minimal: No identifiable suicidal ideation.  Patients presenting with no risk factors but with morbid ruminations; may be classified as minimal risk based on the severity of the depressive symptoms    Plan Of Care/Follow-up recommendations:  Activity:  as tolerated Diet:  heart healthy diet  LORD, JAMISON, NP 01/01/2016, 10:59 AM

## 2016-01-01 NOTE — BH Assessment (Signed)
[place] Assessment Progress Note  Per Corena Pilgrim, MD, this pt does not require psychiatric hospitalization at this time.  Pt is to be discharged from [facility] with recommendation to follow up with the [facility] outpatient clinic, her current outpatient provider..  This has been included in pt's discharge instructions.  Pt's nurse has been notified.  Jalene Mullet, [place] Triage Specialist [phone]

## 2016-01-01 NOTE — Discharge Instructions (Signed)
For your ongoing behavioral health needs, you are advised to continue treatment at the [facility]:       [facility]      [phone]

## 2016-01-01 NOTE — Progress Notes (Signed)
Cm consult from TTS requesting assistance to get pt a neurology consult for pt prior to d/c Pt with c/o SI with increase headaches  Cm spoke with pt after reviewing EPIC - EPIC indicates pt without insurance but pt states she has BCBS via [employer] and thouoght possibly [place] coverage  ED CM spoke with ED registration Holley Dexter to get assist to verify/clairfy pt coverage  Cm discussed use [employer] provider to get referral to see a provider of on the list of bcbs neurologist And the uninsured [facility] program
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
## 2016-01-09 ENCOUNTER — Encounter (HOSPITAL_COMMUNITY): Payer: Self-pay | Admitting: *Deleted

## 2016-01-09 ENCOUNTER — Emergency Department (HOSPITAL_COMMUNITY)
Admission: EM | Admit: 2016-01-09 | Discharge: 2016-01-10 | Disposition: A | Discharge status: Other Acute Inpatient Hospital | Payer: BLUE CROSS/BLUE SHIELD | Attending: Emergency Medicine | Admitting: Emergency Medicine

## 2016-01-09 DIAGNOSIS — F332 Major depressive disorder, recurrent severe without psychotic features: Secondary | ICD-10-CM | POA: Insufficient documentation

## 2016-01-09 DIAGNOSIS — Z79899 Other long term (current) drug therapy: Secondary | ICD-10-CM | POA: Insufficient documentation

## 2016-01-09 DIAGNOSIS — R45851 Suicidal ideations: Secondary | ICD-10-CM | POA: Diagnosis not present

## 2016-01-09 DIAGNOSIS — F330 Major depressive disorder, recurrent, mild: Secondary | ICD-10-CM | POA: Diagnosis not present

## 2016-01-09 LAB — CBC
HCT: 39.5 % (ref 36.0–46.0)
Hemoglobin: 12.3 g/dL (ref 12.0–15.0)
MCH: 25.7 pg — AB (ref 26.0–34.0)
MCHC: 31.1 g/dL (ref 30.0–36.0)
MCV: 82.6 fL (ref 78.0–100.0)
PLATELETS: 347 10*3/uL (ref 150–400)
RBC: 4.78 MIL/uL (ref 3.87–5.11)
RDW: 16 % — AB (ref 11.5–15.5)
WBC: 5.7 10*3/uL (ref 4.0–10.5)

## 2016-01-09 LAB — COMPREHENSIVE METABOLIC PANEL
ALK PHOS: 80 U/L (ref 38–126)
ALT: 24 U/L (ref 14–54)
AST: 50 U/L — ABNORMAL HIGH (ref 15–41)
Albumin: 4.1 g/dL (ref 3.5–5.0)
Anion gap: 9 (ref 5–15)
BILIRUBIN TOTAL: 0.7 mg/dL (ref 0.3–1.2)
BUN: 6 mg/dL (ref 6–20)
CALCIUM: 9 mg/dL (ref 8.9–10.3)
CHLORIDE: 109 mmol/L (ref 101–111)
CO2: 23 mmol/L (ref 22–32)
CREATININE: 1.01 mg/dL — AB (ref 0.44–1.00)
Glucose, Bld: 91 mg/dL (ref 65–99)
Potassium: 3.4 mmol/L — ABNORMAL LOW (ref 3.5–5.1)
Sodium: 141 mmol/L (ref 135–145)
TOTAL PROTEIN: 8 g/dL (ref 6.5–8.1)

## 2016-01-09 LAB — ACETAMINOPHEN LEVEL: Acetaminophen (Tylenol), Serum: <10 ug/mL — ABNORMAL LOW (ref 10–30)

## 2016-01-09 LAB — SALICYLATE LEVEL

## 2016-01-09 LAB — RAPID URINE DRUG SCREEN, HOSP PERFORMED
Amphetamines: NOT DETECTED
Barbiturates: NOT DETECTED
Benzodiazepines: NOT DETECTED
Cocaine: NOT DETECTED
OPIATES: NOT DETECTED
Tetrahydrocannabinol: NOT DETECTED

## 2016-01-09 LAB — ETHANOL

## 2016-01-09 MED ORDER — QUETIAPINE FUMARATE 50 MG PO TABS
50.0000 mg | ORAL_TABLET | Freq: Every day | ORAL | Status: DC
  Administered 2016-01-09: 50 mg via ORAL
  Filled 2016-01-09: qty 1

## 2016-01-09 MED ORDER — FLUOXETINE HCL 20 MG PO CAPS: 40.0000 mg | ORAL_CAPSULE | Freq: Every day | ORAL | Status: DC

## 2016-01-09 MED ORDER — HYDROXYZINE HCL 25 MG PO TABS: 50.0000 mg | ORAL_TABLET | Freq: Two times a day (BID) | ORAL | Status: DC | PRN

## 2016-01-09 NOTE — ED Provider Notes (Signed)
[place] DEPT Provider Note   CSN: [id number] Arrival date & time: 01/09/16  1653     History   Chief Complaint Chief Complaint  Patient presents with  . Suicidal    HPI Shannon [name] is a 25 y.o. female hx of depression, Who presented with suicidal ideation. 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 She told [employer] that she was thinking of killing herself for a week. She states to me that, after talking to the office, she felt better and doesn't want to kill herself. She has been depressed. Was seen in the ED a week ago for suicidal ideation and placed on prozac and vistaril. She states that she has been taking them as prescribed and denies drug use.    The history is provided by the patient.    Past Medical History:  Diagnosis Date  . Depression   . Medical history non-contributory     Patient Active Problem List   Diagnosis Date Noted  . Major depressive disorder, recurrent episode, mild ([place]) 01/01/2016  . Suicide ideation 02/16/2013    Past Surgical History:  Procedure Laterality Date  . NO PAST SURGERIES      OB History    No data available       Home Medications    Prior to Admission medications   Medication Sig Start Date End Date Taking? Authorizing Provider  Ferrous Sulfate Dried 45 MG TBCR Take 1 tablet by mouth every morning.   Yes Historical Provider, MD  FLUoxetine (PROZAC) 40 MG capsule Take 40 mg by mouth every morning.   Yes Historical Provider, MD  hydrOXYzine (ATARAX/VISTARIL) 50 MG tablet Take 1 tablet (50 mg total) by mouth 3 times/day as needed-between meals & bedtime for anxiety (sleep). 01/01/16  Yes Patrecia Pour, NP  levonorgestrel-ethinyl estradiol (AVIANE,ALESSE,LESSINA) 0.1-20 MG-MCG tablet Take 1 tablet by mouth every morning.   Yes Historical Provider, MD  QUEtiapine  (SEROQUEL) 50 MG tablet Take 50 mg by mouth at bedtime.   Yes Historical Provider, MD  buPROPion (WELLBUTRIN XL) 150 MG 24 hr tablet Take 1 tablet (150 mg total) by mouth daily. For Depression. Patient not taking: Reported on 01/09/2016 02/21/13   Niel Hummer, NP  traZODone (DESYREL) 50 MG tablet Take 1 tablet (50 mg total) by mouth at bedtime and may repeat dose one time if needed. Patient not taking: Reported on 01/09/2016 02/21/13   Niel Hummer, NP    Family History No family history on file.  Social History Social History  Substance Use Topics  . Smoking status: Never Smoker  . Smokeless tobacco: Never Used  . Alcohol use No     Allergies   Bupropion   Review of Systems Review of Systems  Psychiatric/Behavioral: Positive for dysphoric mood and suicidal ideas.  All other systems reviewed and are negative.    Physical Exam Updated Vital Signs BP 145/97 (BP Location: Left Arm)   Pulse 104   Temp 98.6 F (37 C) (Oral)   Resp 16   LMP 12/11/2015   SpO2 100%   Physical Exam  Constitutional: She is oriented to person, place, and time.  Depressed   HENT:  Head: Normocephalic.  Mouth/Throat: Oropharynx is clear and moist.  Eyes: EOM are normal. Pupils are equal, round, and reactive to light.  Neck: Normal range of  motion. Neck supple.  Cardiovascular: Normal rate, regular rhythm and normal heart sounds.   Pulmonary/Chest: Effort normal and breath sounds normal. No respiratory distress. She has no wheezes. She has no rales.  Abdominal: Soft. Bowel sounds are normal. She exhibits no distension. There is no tenderness. There is no guarding.  Musculoskeletal: Normal range of motion.  Neurological: She is alert and oriented to person, place, and time. No cranial nerve deficit. Coordination normal.  Skin: Skin is warm.  Psychiatric:  Depressed, avoiding eye contact   Nursing note and vitals reviewed.    ED Treatments / Results  Labs (all labs ordered are listed,  but only abnormal results are displayed) Labs Reviewed  COMPREHENSIVE METABOLIC PANEL - Abnormal; Notable for the following:       Result Value   Potassium 3.4 (*)    Creatinine, Ser 1.01 (*)    AST 50 (*)    All other components within normal limits  ACETAMINOPHEN LEVEL - Abnormal; Notable for the following:    Acetaminophen (Tylenol), Serum <10 (*)    All other components within normal limits  CBC - Abnormal; Notable for the following:    MCH 25.7 (*)    RDW 16.0 (*)    All other components within normal limits  ETHANOL  SALICYLATE LEVEL  RAPID URINE DRUG SCREEN, HOSP PERFORMED    EKG  EKG Interpretation None       Radiology No results found.  Procedures Procedures (including critical care time)  Medications Ordered in ED Medications - No data to display   Initial Impression / Assessment and Plan / ED Course  I have reviewed the triage vital signs and the nursing notes.  Pertinent labs & imaging results that were available during my care of the patient were reviewed by me and considered in my medical decision making (see chart for details).  Clinical Course    Shannon [name] is a 25 y.o. female here with suicidal ideation. Patient not under IVC currently. She is still depressed but doesn't feel as bad. Labs unremarkable. Will consult TTS. Placed on home meds. Medically cleared.    Final Clinical Impressions(s) / ED Diagnoses   Final diagnoses:  None    New Prescriptions New Prescriptions   No medications on file     Drenda Freeze, MD 01/09/16 1926

## 2016-01-09 NOTE — ED Notes (Signed)
Patient resting, watching TV, sitter in view of patient

## 2016-01-09 NOTE — ED Notes (Signed)
Patient belongings placed in locker 30.

## 2016-01-09 NOTE — ED Notes (Signed)
Security called for patient to be wanded.
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
## 2016-01-09 NOTE — Progress Notes (Signed)
Patient listed as having [employer] insurance without a pcp.  [facility] provided patient with list of providers who accept [employer] insurance within a 20 mile radius of patient's listed zip code [address].  EDCM placed these resources in patient's belongings bag in locker #30 in locker in [place].  No further EDCM needs at this time.
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
## 2016-01-09 NOTE — ED Notes (Signed)
Report called to Raquel Sarna, RN in [place].

## 2016-01-10 ENCOUNTER — Inpatient Hospital Stay (HOSPITAL_COMMUNITY)
Admission: EM | Admit: 2016-01-10 | Discharge: 2016-01-13 | DRG: 885 | Disposition: A | Discharge status: Home / Self Care | Payer: BLUE CROSS/BLUE SHIELD | Source: Intra-hospital | Attending: Psychiatry | Admitting: Psychiatry

## 2016-01-10 ENCOUNTER — Encounter (HOSPITAL_COMMUNITY): Payer: Self-pay | Admitting: *Deleted

## 2016-01-10 DIAGNOSIS — R45851 Suicidal ideations: Secondary | ICD-10-CM | POA: Diagnosis present

## 2016-01-10 DIAGNOSIS — Z915 Personal history of self-harm: Secondary | ICD-10-CM | POA: Diagnosis not present

## 2016-01-10 DIAGNOSIS — F419 Anxiety disorder, unspecified: Secondary | ICD-10-CM | POA: Diagnosis not present

## 2016-01-10 DIAGNOSIS — F332 Major depressive disorder, recurrent severe without psychotic features: Principal | ICD-10-CM | POA: Diagnosis present

## 2016-01-10 DIAGNOSIS — Z79899 Other long term (current) drug therapy: Secondary | ICD-10-CM | POA: Diagnosis not present

## 2016-01-10 DIAGNOSIS — G4700 Insomnia, unspecified: Secondary | ICD-10-CM | POA: Diagnosis not present

## 2016-01-10 MED ORDER — HYDROXYZINE HCL 50 MG PO TABS: 50.0000 mg | ORAL_TABLET | Freq: Two times a day (BID) | ORAL | Status: DC | PRN

## 2016-01-10 MED ORDER — FLUOXETINE HCL 20 MG PO CAPS
40.0000 mg | ORAL_CAPSULE | Freq: Every day | ORAL | Status: DC
  Administered 2016-01-10: 40 mg via ORAL
  Filled 2016-01-10 (×3): qty 2

## 2016-01-10 MED ORDER — MAGNESIUM HYDROXIDE 400 MG/5ML PO SUSP: 30.0000 mL | Freq: Every day | ORAL | Status: DC | PRN

## 2016-01-10 MED ORDER — ACETAMINOPHEN 325 MG PO TABS
650.0000 mg | ORAL_TABLET | Freq: Four times a day (QID) | ORAL | Status: DC | PRN
  Administered 2016-01-10 (×2): 650 mg via ORAL
  Filled 2016-01-10 (×3): qty 2

## 2016-01-10 MED ORDER — ALUM & MAG HYDROXIDE-SIMETH 200-200-20 MG/5ML PO SUSP: 30.0000 mL | ORAL | Status: DC | PRN

## 2016-01-10 MED ORDER — FLUOXETINE HCL 20 MG PO CAPS
40.0000 mg | ORAL_CAPSULE | Freq: Every day | ORAL | Status: DC
  Administered 2016-01-11 – 2016-01-13 (×3): 40 mg via ORAL
  Filled 2016-01-10 (×5): qty 2

## 2016-01-10 MED ORDER — QUETIAPINE FUMARATE 50 MG PO TABS
50.0000 mg | ORAL_TABLET | Freq: Every day | ORAL | Status: DC
  Administered 2016-01-10 – 2016-01-12 (×3): 50 mg via ORAL
  Filled 2016-01-10 (×5): qty 1

## 2016-01-10 NOTE — Progress Notes (Addendum)
Admission note:  Patient is a 25 yo female admitted to [facility] for depression.  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  Per ED note, patient volunteers at a local museum and communicated to the staff that she was suicidal.  Patient had a plan to jump off a bridge, which she now denies.  She also communicated to the staff that would stab herself in front of the staff.  During admission, patient stated that she is not suicidal and she no longer works at [employer].  Patient was brought into the ED voluntarily via [employer].  She states, "I enjoyed my talk with the police officer.  It really helped."  Patient has prior admissions at [facility] (July 2016) and [facility] in 2014.  She states "somedays I may feel hopeless or worthless."  She states, "I'm really not feeling depressed.  I might rate it a 4."  Patient's main support is a aunt who lives in [place].  She states that her aunt "is like my mom." Patient is also followed by [facility] and see a therapist by the name of Ms. Barnes.  Patient has no pertinent medical issues; she does not drink alcohol or smoke.  She denies any drug use.  Her skin search was unremarkable with no scars or marks.  Belongings were locked up in locker #12.  Patient denies any thoughts of self harm presently.  She denies HI/AVH.  Patient was oriented to room and unit.  She is currently being seen by a provider.

## 2016-01-10 NOTE — BH Assessment (Signed)
Pt recommended for inpatient admission by Patriciaann Clan, PA. Pt has been assigned to 401 bed 2 by Larose Kells.Attending physician will be Dr.Cobos. Pt may arriver after 0800. Call report to [phone]. Raquel Sarna, RN has been informed of pt disposition.

## 2016-01-10 NOTE — BH Assessment (Signed)
[place] Assessment Progress Note  Per Patriciaann Clan, PA, this pt requires psychiatric hospitalization at this time.  Kim,, RN, [facility] has assigned pt to [facility] Rm 401-2.  Pt has signed Voluntary Admission and Consent for Treatment, as well as Consent to Release Information to her aunt, and to the [facility] and to [facility], her therapist at [facility], and a notification call has been placed to the providers.  Signed forms have been faxed to [facility].  Pt's nurse has been notified, and agrees to send original paperwork along with pt via [place], and to call report to [phone].  Jalene Mullet, [place] Triage Specialist [phone]

## 2016-01-10 NOTE — ED Notes (Signed)
Writer notified by TTS that patient meets requirements for inpatient admission and will notify when placement is arranged.

## 2016-01-10 NOTE — Tx Team (Signed)
Initial Treatment Plan 01/10/2016 11:33 AM Marcene Brawn [id number]    PATIENT STRESSORS: Educational concerns   PATIENT STRENGTHS: Average or above average intelligence Capable of independent living Communication skills Physical Health Supportive family/friends   PATIENT IDENTIFIED PROBLEMS: "I really don't feel that depressed."  "I'm not suicidal."  "I really don't know what I want to work on."  Originally admitted to [facility] for suicidal ideation.  Some feelings of hopelessness and worthlessness.             DISCHARGE CRITERIA:  Improved stabilization in mood, thinking, and/or behavior Motivation to continue treatment in a less acute level of care Verbal commitment to aftercare and medication compliance  PRELIMINARY DISCHARGE PLAN: Return to previous living arrangement Return to previous work or school arrangements  PATIENT/FAMILY INVOLVEMENT: This treatment plan has been presented to and reviewed with the patient, Shannon [name]..  The patient and family have been given the opportunity to ask questions and make suggestions.  Zipporah Plants, RN 01/10/2016, 11:33 AM

## 2016-01-10 NOTE — Progress Notes (Signed)
Adult Psychoeducational Group Note  Date:  01/10/2016 Time:  8:45 PM  Group Topic/Focus:  Wrap-Up Group:   The focus of this group is to help patients review their daily goal of treatment and discuss progress on daily workbooks.   Participation Level:  Minimal  Participation Quality:  Appropriate  Affect:  Appropriate  Cognitive:  Alert  Insight: Appropriate  Engagement in Group:  Engaged  Modes of Intervention:  Discussion  Additional Comments:  Pt rated her day 7/10. Her goal is to stay in the present and look her outpatient treatment. Wynelle Fanny R 01/10/2016, 8:45 PM

## 2016-01-10 NOTE — BHH Suicide Risk Assessment (Signed)
[facility] Admission Suicide Risk Assessment   Nursing information obtained from:   pt Demographic factors:   young Current Mental Status: see MSE    Loss Factors:   none known Historical Factors:   prior psychiatric treatment Risk Reduction Factors:   unknown  Total Time spent with patient: 15 minutes Principal Problem: <principal problem not specified> Diagnosis:   Patient Active Problem List   Diagnosis Date Noted  . Major depressive disorder, recurrent severe without psychotic features ([place]) [F33.2] 01/10/2016  . Major depressive disorder, recurrent episode, mild ([place]) [F33.0] 01/01/2016  . Suicide ideation [R45.851] 02/16/2013   Subjective Data: Patient denies any current suicidal or homicidal ideation, plan or intent. She states that she doesn't know why she has been admitted. Upon questioning she admits that she might have had some suicidal ideation prior to admission. She describes her current mood as "50/50."  Continued Clinical Symptoms: See above   The "Alcohol Use Disorders Identification Test", Guidelines for Use in Primary Care, Second Edition.  World Pharmacologist [facility]). Score between 0-7:  no or low risk or alcohol related problems. Score between 8-15:  moderate risk of alcohol related problems. Score between 16-19:  high risk of alcohol related problems. Score 20 or above:  warrants further diagnostic evaluation for alcohol dependence and treatment.   CLINICAL FACTORS:   Dysthymia Previous Psychiatric Diagnoses and Treatments   Musculoskeletal: Strength & Muscle Tone: within normal limits Gait & Station: seated Patient leans: N/A  Psychiatric Specialty Exam: Physical Exam  ROS  Blood pressure 114/79, pulse 93, temperature 98.5 F (36.9 C), temperature source Oral, resp. rate 16, height 5\' 9"  (1.753 m), weight 127 kg (280 lb), last menstrual period 12/11/2015, SpO2 100 %.Body mass index is 41.35 kg/m.  General Appearance: Fairly Groomed  Eye Contact:   Minimal  Speech:  Slow  Volume:  Decreased  Mood:  "50/50"  Affect:  Constricted and Flat  Thought Process:  Coherent  Orientation:  Full (Time, Place, and Person)  Thought Content:  Negative  Suicidal Thoughts:  No  Homicidal Thoughts:  No  Memory:  Negative  Judgement:  Impaired  Insight:  Shallow  Psychomotor Activity:  Decreased  Concentration:  Concentration: Fair and Attention Span: Fair  Recall:  Good  Fund of Knowledge:  Good  Language:  Good  Akathisia:  No  Handed:  Right  AIMS (if indicated):     Assets:  Resilience  ADL's:  Intact  Cognition:  WNL  Sleep:         COGNITIVE FEATURES THAT CONTRIBUTE TO RISK:  None    SUICIDE RISK:   Moderate:  Frequent suicidal ideation with limited intensity, and duration, some specificity in terms of plans, no associated intent, good self-control, limited dysphoria/symptomatology, some risk factors present, and identifiable protective factors, including available and accessible social support.   PLAN OF CARE: see PAA  I certify that inpatient services furnished can reasonably be expected to improve the patient's condition.  Linard Millers, MD 01/10/2016, 12:17 PM

## 2016-01-10 NOTE — ED Notes (Signed)
Calling report to [facility] now

## 2016-01-10 NOTE — H&P (Signed)
Psychiatric Admission Assessment Adult  Patient Identification: Shannon [name] MRN:  [id number] Date of Evaluation:  01/10/2016 Chief Complaint:  mdd recurrent Principal Diagnosis: Major depressive disorder, recurrent severe without psychotic features ([place]) Diagnosis:   Patient Active Problem List   Diagnosis Date Noted  . Major depressive disorder, recurrent severe without psychotic features ([place]) [F33.2] 01/10/2016    Priority: High  . Major depressive disorder, recurrent episode, mild ([place]) [F33.0] 01/01/2016  . Suicide ideation [R45.851] 02/16/2013   History of Present Illness:  Shannon [name] is an 25 y.o. female. Pt presents with depressed mood and blunted affect. Pt is oriented. Pt providing apathetic and vague responses.  Assessment information obtained from pt interview, clinical observation, and review of pt chart.  Pt presents voluntarily via [employer].  Pt volunteers at [employer] and communicated to staff suicidal ideation with plan to jump off of a bridge. Pt also communicated plan to come to the museum and stab herself in front of museum staff members. Pt also presented to ED on 10.8.17 with c/o depression, headaches, and recent suicidal ideation with plan to drive her car off of a bridge.   When asked about current suicidal ideation pt states "I guess so" and "I'm fine now". Pt does not provide details regarding prior to arrival events/suicidal statements. Pt provides vague responses regarding current suicidal intent and history of suicide attempt. Pt denies homicidal ideation and hallucinations.  Pt reports history of "I don't know what they are calling it now. I'll go with depression."  Pt reports history of two inpatient admissions (2016 [place], [facility]). Pt is followed by Delrae Alfred and Suzanne Boron for psychiatric treatment.   Today on 01/10/16, Pt seen and chart reviewed. Pt is alert/oriented x4, calm, cooperative, and appropriate to situation. Pt denies  suicidal/homicidal ideation and psychosis and does not appear to be responding to internal stimuli. Pt is minimizing the severity of her symptoms and her suicidal ideation, stating that she "feels good" and that was just in the moment. However, pt reports that she is concerned about her coping skills and feels that she is very reactive and easily triggered by acute situations that "put me over the edge." When discussing medication management and possible upward titration of her Prozac, pt reports that Meghan (our former [facility] colleague) and current provider, did increase her Prozac to 60 which caused extreme headaches and just moved it back to '40mg'$  and added the Seroquel. Therefore, pt is not receptive to medication changes today but reports that she would like to work on her coping skills today and will discuss possible medication changes tomorrow.    Associated Signs/Symptoms: Depression Symptoms:  depressed mood, feelings of worthlessness/guilt, hopelessness, recurrent thoughts of death, suicidal thoughts with specific plan, (Hypo) Manic Symptoms:  Irritable Mood, Anxiety Symptoms:  Excessive Worry, Psychotic Symptoms:  Denies PTSD Symptoms: NA Total Time spent with patient: 45 minutes  Past Psychiatric History: MDD, GAD, insomnia  Is the patient at risk to self? Yes.    Has the patient been a risk to self in the past 6 months? Yes.    Has the patient been a risk to self within the distant past? Yes.    Is the patient a risk to others? No.  Has the patient been a risk to others in the past 6 months? No.  Has the patient been a risk to others within the distant past? No.   Prior Inpatient Therapy:   Prior Outpatient Therapy:    Alcohol Screening: 1.  How often do you have a drink containing alcohol?: Never 9. Have you or someone else been injured as a result of your drinking?: No 10. Has a relative or friend or a doctor or another health worker been concerned about your drinking or  suggested you cut down?: No Alcohol Use Disorder Identification Test Final Score (AUDIT): 0 Brief Intervention: AUDIT score less than 7 or less-screening does not suggest unhealthy drinking-brief intervention not indicated Substance Abuse History in the last 12 months:  No. Consequences of Substance Abuse: NA Previous Psychotropic Medications: Yes  Psychological Evaluations: Yes  Past Medical History:  Past Medical History:  Diagnosis Date  . Depression   . Medical history non-contributory     Past Surgical History:  Procedure Laterality Date  . NO PAST SURGERIES     Family History: History reviewed. No pertinent family history. Family Psychiatric  History: MDD Tobacco Screening: Have you used any form of tobacco in the last 30 days? (Cigarettes, Smokeless Tobacco, Cigars, and/or Pipes): No Social History:  History  Alcohol Use No     History  Drug Use No    Additional Social History:                           Allergies:   Allergies  Allergen Reactions  . Bupropion Other (See Comments)    Constipation    Lab Results:  Results for orders placed or performed during the hospital encounter of 01/09/16 (from the past 48 hour(s))  Comprehensive metabolic panel     Status: Abnormal   Collection Time: 01/09/16  5:15 PM  Result Value Ref Range   Sodium 141 135 - 145 mmol/L   Potassium 3.4 (L) 3.5 - 5.1 mmol/L   Chloride 109 101 - 111 mmol/L   CO2 23 22 - 32 mmol/L   Glucose, Bld 91 65 - 99 mg/dL   BUN 6 6 - 20 mg/dL   Creatinine, Ser 1.01 (H) 0.44 - 1.00 mg/dL   Calcium 9.0 8.9 - 10.3 mg/dL   Total Protein 8.0 6.5 - 8.1 g/dL   Albumin 4.1 3.5 - 5.0 g/dL   AST 50 (H) 15 - 41 U/L   ALT 24 14 - 54 U/L   Alkaline Phosphatase 80 38 - 126 U/L   Total Bilirubin 0.7 0.3 - 1.2 mg/dL   GFR calc non Af Amer >60 >60 mL/min   GFR calc Af Amer >60 >60 mL/min    Comment: (NOTE) The eGFR has been calculated using the CKD EPI equation. This calculation has not been  validated in all clinical situations. eGFR's persistently <60 mL/min signify possible Chronic Kidney Disease.    Anion gap 9 5 - 15  Ethanol     Status: None   Collection Time: 01/09/16  5:15 PM  Result Value Ref Range   Alcohol, Ethyl (B) <5 <5 mg/dL    Comment:        LOWEST DETECTABLE LIMIT FOR SERUM ALCOHOL IS 5 mg/dL FOR MEDICAL PURPOSES ONLY   Salicylate level     Status: None   Collection Time: 01/09/16  5:15 PM  Result Value Ref Range   Salicylate Lvl <1.6 2.8 - 30.0 mg/dL  Acetaminophen level     Status: Abnormal   Collection Time: 01/09/16  5:15 PM  Result Value Ref Range   Acetaminophen (Tylenol), Serum <10 (L) 10 - 30 ug/mL    Comment:        THERAPEUTIC CONCENTRATIONS VARY  SIGNIFICANTLY. A RANGE OF 10-30 ug/mL MAY BE AN EFFECTIVE CONCENTRATION FOR MANY PATIENTS. HOWEVER, SOME ARE BEST TREATED AT CONCENTRATIONS OUTSIDE THIS RANGE. ACETAMINOPHEN CONCENTRATIONS >150 ug/mL AT 4 HOURS AFTER INGESTION AND >50 ug/mL AT 12 HOURS AFTER INGESTION ARE OFTEN ASSOCIATED WITH TOXIC REACTIONS.   cbc     Status: Abnormal   Collection Time: 01/09/16  5:15 PM  Result Value Ref Range   WBC 5.7 4.0 - 10.5 K/uL   RBC 4.78 3.87 - 5.11 MIL/uL   Hemoglobin 12.3 12.0 - 15.0 g/dL   HCT 39.5 36.0 - 46.0 %   MCV 82.6 78.0 - 100.0 fL   MCH 25.7 (L) 26.0 - 34.0 pg   MCHC 31.1 30.0 - 36.0 g/dL   RDW 16.0 (H) 11.5 - 15.5 %   Platelets 347 150 - 400 K/uL  Rapid urine drug screen (hospital performed)     Status: None   Collection Time: 01/09/16  5:15 PM  Result Value Ref Range   Opiates NONE DETECTED NONE DETECTED   Cocaine NONE DETECTED NONE DETECTED   Benzodiazepines NONE DETECTED NONE DETECTED   Amphetamines NONE DETECTED NONE DETECTED   Tetrahydrocannabinol NONE DETECTED NONE DETECTED   Barbiturates NONE DETECTED NONE DETECTED    Comment:        DRUG SCREEN FOR MEDICAL PURPOSES ONLY.  IF CONFIRMATION IS NEEDED FOR ANY PURPOSE, NOTIFY LAB WITHIN 5 DAYS.        LOWEST  DETECTABLE LIMITS FOR URINE DRUG SCREEN Drug Class       Cutoff (ng/mL) Amphetamine      1000 Barbiturate      200 Benzodiazepine   175 Tricyclics       102 Opiates          300 Cocaine          300 THC              50     Blood Alcohol level:  Lab Results  Component Value Date   ETH <5 01/09/2016   ETH <5 58/52/7782    Metabolic Disorder Labs:  No results found for: HGBA1C, MPG No results found for: PROLACTIN No results found for: CHOL, TRIG, HDL, CHOLHDL, VLDL, LDLCALC  Current Medications: Current Facility-Administered Medications  Medication Dose Route Frequency Provider Last Rate Last Dose  . acetaminophen (TYLENOL) tablet 650 mg  650 mg Oral Q6H PRN Patrecia Pour, NP   650 mg at 01/10/16 1636  . alum & mag hydroxide-simeth (MAALOX/MYLANTA) 200-200-20 MG/5ML suspension 30 mL  30 mL Oral Q4H PRN Patrecia Pour, NP      . FLUoxetine (PROZAC) capsule 40 mg  40 mg Oral Daily Kerrie Buffalo, NP      . hydrOXYzine (ATARAX/VISTARIL) tablet 50 mg  50 mg Oral BID BM & HS PRN Patrecia Pour, NP      . magnesium hydroxide (MILK OF MAGNESIA) suspension 30 mL  30 mL Oral Daily PRN Patrecia Pour, NP      . QUEtiapine (SEROQUEL) tablet 50 mg  50 mg Oral QHS Patrecia Pour, NP       PTA Medications: Prescriptions Prior to Admission  Medication Sig Dispense Refill Last Dose  . buPROPion (WELLBUTRIN XL) 150 MG 24 hr tablet Take 1 tablet (150 mg total) by mouth daily. For Depression. 30 tablet 0 Not Taking at Unknown time  . Ferrous Sulfate Dried 45 MG TBCR Take 1 tablet by mouth every morning.   01/09/2016 at Unknown time  .  FLUoxetine (PROZAC) 40 MG capsule Take 40 mg by mouth every morning.   01/09/2016 at Unknown time  . hydrOXYzine (ATARAX/VISTARIL) 50 MG tablet Take 1 tablet (50 mg total) by mouth 3 times/day as needed-between meals & bedtime for anxiety (sleep). 30 tablet 0 01/09/2016 at Unknown time  . levonorgestrel-ethinyl estradiol (AVIANE,ALESSE,LESSINA) 0.1-20 MG-MCG tablet  Take 1 tablet by mouth every morning.   01/08/2016 at Unknown time  . QUEtiapine (SEROQUEL) 50 MG tablet Take 50 mg by mouth at bedtime.   01/08/2016 at Unknown time  . traZODone (DESYREL) 50 MG tablet Take 1 tablet (50 mg total) by mouth at bedtime and may repeat dose one time if needed. (Patient not taking: Reported on 01/10/2016) 60 tablet 0 Not Taking at Unknown time    Musculoskeletal: Strength & Muscle Tone: within normal limits Gait & Station: normal Patient leans: N/A  Psychiatric Specialty Exam: Physical Exam  Review of Systems  Psychiatric/Behavioral: Positive for depression and suicidal ideas (now minimizing). Negative for hallucinations and substance abuse. The patient is nervous/anxious and has insomnia.   All other systems reviewed and are negative.   Blood pressure 114/79, pulse 93, temperature 98.5 F (36.9 C), temperature source Oral, resp. rate 16, height '5\' 9"'$  (1.753 m), weight 127 kg (280 lb), last menstrual period 12/11/2015, SpO2 100 %.Body mass index is 41.35 kg/m.  General Appearance: Casual and Fairly Groomed  Eye Contact:  Minimal  Speech:  Clear and Coherent and Normal Rate  Volume:  Normal  Mood:  Depressed  Affect:  Appropriate, Congruent and Depressed  Thought Process:  Coherent, Goal Directed, Linear and Descriptions of Associations: Intact  Orientation:  Full (Time, Place, and Person)  Thought Content:  Symptoms, worries, concerns, coping skills, does not want med changes yet  Suicidal Thoughts:  Yes but minimizing at this time  Homicidal Thoughts:  No  Memory:  Immediate;   Fair Recent;   Fair Remote;   Fair  Judgement:  Fair  Insight:  Fair  Psychomotor Activity:  Normal  Concentration:  Concentration: Fair and Attention Span: Fair  Recall:  [employer] of Knowledge:  Fair  Language:  Fair  Akathisia:  No  Handed:    AIMS (if indicated):     Assets:  Communication Skills Desire for Improvement Resilience Social Support Talents/Skills   ADL's:  Intact  Cognition:  WNL  Sleep:      Treatment Plan Summary: Major depressive disorder, recurrent severe without psychotic features ([place]) unstable, managed as below:  Medications:  -Continue prozac '40mg'$  daily for MDD -Continue Seroquel '50mg'$  po qhs for insomnia -Continue Vistaril '50mg'$  po bid prn anxiety  Labs/tests:  Reviewed CBC, CMP, UDS and pt is hypokalemic -Ordered UPT STAT (not on file since 2014) -12 lead EKG  -Redo CMP due to rising Creatinine and elevated AST (50)   Observation Level/Precautions:  15 minute checks  Laboratory:  Labs resulted, reviewed, and stable at this time.   Psychotherapy:  Group therapy, individual therapy, psychoeducation  Medications:  See MAR above  Consultations: None    Discharge Concerns: None    Estimated LOS: 5-7 days  Other:  N/A    Physician Treatment Plan for Primary Diagnosis: Major depressive disorder, recurrent severe without psychotic features ([place]) Long Term Goal(s): Improvement in symptoms so as ready for discharge  Short Term Goals: Ability to identify changes in lifestyle to reduce recurrence of condition will improve, Ability to verbalize feelings will improve, Ability to disclose and discuss suicidal ideas, Ability to demonstrate  self-control will improve, Ability to identify and develop effective coping behaviors will improve and Ability to maintain clinical measurements within normal limits will improve  Physician Treatment Plan for Secondary Diagnosis: Active Problems:   Major depressive disorder, recurrent severe without psychotic features ([place])  Long Term Goal(s): Improvement in symptoms so as ready for discharge  Short Term Goals: Ability to identify changes in lifestyle to reduce recurrence of condition will improve, Ability to verbalize feelings will improve, Ability to disclose and discuss suicidal ideas, Ability to demonstrate self-control will improve, Ability to identify and develop effective coping  behaviors will improve and Ability to maintain clinical measurements within normal limits will improve  I certify that inpatient services furnished can reasonably be expected to improve the patient's condition.    Benjamine Mola, [place] 10/18/20176:04 PM

## 2016-01-10 NOTE — BH Assessment (Signed)
TTS interview completed. Pt recommended for inpatient admission by Patriciaann Clan, PA. Raquel Sarna, RN informed of pt disposition.

## 2016-01-11 DIAGNOSIS — F332 Major depressive disorder, recurrent severe without psychotic features: Principal | ICD-10-CM

## 2016-01-11 DIAGNOSIS — Z79899 Other long term (current) drug therapy: Secondary | ICD-10-CM

## 2016-01-11 LAB — PREGNANCY, URINE: PREG TEST UR: NEGATIVE

## 2016-01-11 MED ORDER — POTASSIUM CHLORIDE CRYS ER 20 MEQ PO TBCR
20.0000 meq | EXTENDED_RELEASE_TABLET | Freq: Once | ORAL | Status: AC
  Administered 2016-01-11: 20 meq via ORAL
  Filled 2016-01-11 (×2): qty 1

## 2016-01-11 NOTE — Progress Notes (Signed)
[facility] MD Progress Note  01/11/2016 10:17 AM Shannon [name]  MRN:  [id number] Subjective:  "I'm doing great. I feel like this was just a situation where I got upset".   Objective: Pt seen and chart reviewed. Pt is alert/oriented x4, calm, cooperative, and appropriate to situation. Pt denies suicidal/homicidal ideation and psychosis and does not appear to be responding to internal stimuli. Pt is minimizing her symptoms is and is mostly in denial about the seriousness of her suicidal ideation prior to arrival.   Principal Problem: Major depressive disorder, recurrent severe without psychotic features ([facility]) Diagnosis:   Patient Active Problem List   Diagnosis Date Noted  . Major depressive disorder, recurrent severe without psychotic features ([facility]) [F33.2] 01/10/2016    Priority: High  . Major depressive disorder, recurrent episode, mild ([facility]) [F33.0] 01/01/2016  . Suicide ideation [R45.851] 02/16/2013   Total Time spent with patient: 25 minutes  Past Medical History:  Past Medical History:  Diagnosis Date  . Depression   . Medical history non-contributory     Past Surgical History:  Procedure Laterality Date  . NO PAST SURGERIES     Family History: History reviewed. No pertinent family history. Social History:  History  Alcohol Use No     History  Drug Use No    Social History   Social History  . Marital status: Single    Spouse name: N/A  . Number of children: N/A  . Years of education: N/A   Social History Main Topics  . Smoking status: Never Smoker  . Smokeless tobacco: Never Used  . Alcohol use No  . Drug use: No  . Sexual activity: No   Other Topics Concern  . None   Social History Narrative  . None   Additional Social History:                         Sleep: Good  Appetite:  Good  Current Medications: Current Facility-Administered Medications  Medication Dose Route Frequency Provider Last Rate Last Dose  . acetaminophen (TYLENOL) tablet 650  mg  650 mg Oral Q6H PRN Charm Rings, NP   650 mg at 01/10/16 2300  . alum & mag hydroxide-simeth (MAALOX/MYLANTA) 200-200-20 MG/5ML suspension 30 mL  30 mL Oral Q4H PRN Charm Rings, NP      . FLUoxetine (PROZAC) capsule 40 mg  40 mg Oral Daily Adonis Brook, NP   40 mg at 01/11/16 2382  . hydrOXYzine (ATARAX/VISTARIL) tablet 50 mg  50 mg Oral BID BM & HS PRN Charm Rings, NP      . magnesium hydroxide (MILK OF MAGNESIA) suspension 30 mL  30 mL Oral Daily PRN Charm Rings, NP      . QUEtiapine (SEROQUEL) tablet 50 mg  50 mg Oral QHS Charm Rings, NP   50 mg at 01/10/16 2219    Lab Results:  Results for orders placed or performed during the hospital encounter of 01/09/16 (from the past 48 hour(s))  Comprehensive metabolic panel     Status: Abnormal   Collection Time: 01/09/16  5:15 PM  Result Value Ref Range   Sodium 141 135 - 145 mmol/L   Potassium 3.4 (L) 3.5 - 5.1 mmol/L   Chloride 109 101 - 111 mmol/L   CO2 23 22 - 32 mmol/L   Glucose, Bld 91 65 - 99 mg/dL   BUN 6 6 - 20 mg/dL   Creatinine, Ser 5.09 (H) 0.44 -  1.00 mg/dL   Calcium 9.0 8.9 - 10.3 mg/dL   Total Protein 8.0 6.5 - 8.1 g/dL   Albumin 4.1 3.5 - 5.0 g/dL   AST 50 (H) 15 - 41 U/L   ALT 24 14 - 54 U/L   Alkaline Phosphatase 80 38 - 126 U/L   Total Bilirubin 0.7 0.3 - 1.2 mg/dL   GFR calc non Af Amer >60 >60 mL/min   GFR calc Af Amer >60 >60 mL/min    Comment: (NOTE) The eGFR has been calculated using the CKD EPI equation. This calculation has not been validated in all clinical situations. eGFR's persistently <60 mL/min signify possible Chronic Kidney Disease.    Anion gap 9 5 - 15  Ethanol     Status: None   Collection Time: 01/09/16  5:15 PM  Result Value Ref Range   Alcohol, Ethyl (B) <5 <5 mg/dL    Comment:        LOWEST DETECTABLE LIMIT FOR SERUM ALCOHOL IS 5 mg/dL FOR MEDICAL PURPOSES ONLY   Salicylate level     Status: None   Collection Time: 01/09/16  5:15 PM  Result Value Ref Range    Salicylate Lvl <1.6 2.8 - 30.0 mg/dL  Acetaminophen level     Status: Abnormal   Collection Time: 01/09/16  5:15 PM  Result Value Ref Range   Acetaminophen (Tylenol), Serum <10 (L) 10 - 30 ug/mL    Comment:        THERAPEUTIC CONCENTRATIONS VARY SIGNIFICANTLY. A RANGE OF 10-30 ug/mL MAY BE AN EFFECTIVE CONCENTRATION FOR MANY PATIENTS. HOWEVER, SOME ARE BEST TREATED AT CONCENTRATIONS OUTSIDE THIS RANGE. ACETAMINOPHEN CONCENTRATIONS >150 ug/mL AT 4 HOURS AFTER INGESTION AND >50 ug/mL AT 12 HOURS AFTER INGESTION ARE OFTEN ASSOCIATED WITH TOXIC REACTIONS.   cbc     Status: Abnormal   Collection Time: 01/09/16  5:15 PM  Result Value Ref Range   WBC 5.7 4.0 - 10.5 K/uL   RBC 4.78 3.87 - 5.11 MIL/uL   Hemoglobin 12.3 12.0 - 15.0 g/dL   HCT 39.5 36.0 - 46.0 %   MCV 82.6 78.0 - 100.0 fL   MCH 25.7 (L) 26.0 - 34.0 pg   MCHC 31.1 30.0 - 36.0 g/dL   RDW 16.0 (H) 11.5 - 15.5 %   Platelets 347 150 - 400 K/uL  Rapid urine drug screen (hospital performed)     Status: None   Collection Time: 01/09/16  5:15 PM  Result Value Ref Range   Opiates NONE DETECTED NONE DETECTED   Cocaine NONE DETECTED NONE DETECTED   Benzodiazepines NONE DETECTED NONE DETECTED   Amphetamines NONE DETECTED NONE DETECTED   Tetrahydrocannabinol NONE DETECTED NONE DETECTED   Barbiturates NONE DETECTED NONE DETECTED    Comment:        DRUG SCREEN FOR MEDICAL PURPOSES ONLY.  IF CONFIRMATION IS NEEDED FOR ANY PURPOSE, NOTIFY LAB WITHIN 5 DAYS.        LOWEST DETECTABLE LIMITS FOR URINE DRUG SCREEN Drug Class       Cutoff (ng/mL) Amphetamine      1000 Barbiturate      200 Benzodiazepine   109 Tricyclics       604 Opiates          300 Cocaine          300 THC              50     Blood Alcohol level:  Lab Results  Component Value Date  ETH <5 01/09/2016   ETH <5 16/12/9602    Metabolic Disorder Labs: No results found for: HGBA1C, MPG No results found for: PROLACTIN No results found for: CHOL, TRIG,  HDL, CHOLHDL, VLDL, LDLCALC  Physical Findings: AIMS: Facial and Oral Movements Muscles of Facial Expression: None, normal Lips and Perioral Area: None, normal Jaw: None, normal Tongue: None, normal,Extremity Movements Upper (arms, wrists, hands, fingers): None, normal Lower (legs, knees, ankles, toes): None, normal, Trunk Movements Neck, shoulders, hips: None, normal, Overall Severity Severity of abnormal movements (highest score from questions above): None, normal Incapacitation due to abnormal movements: None, normal Patient's awareness of abnormal movements (rate only patient's report): No Awareness, Dental Status Current problems with teeth and/or dentures?: No Does patient usually wear dentures?: No  CIWA:    COWS:     Musculoskeletal: Strength & Muscle Tone: within normal limits Gait & Station: normal Patient leans: N/A  Psychiatric Specialty Exam: Physical Exam  Review of Systems  Psychiatric/Behavioral: Positive for depression and suicidal ideas. Negative for hallucinations and substance abuse. The patient is nervous/anxious and has insomnia.   All other systems reviewed and are negative.   Blood pressure 119/85, pulse 93, temperature 98.3 F (36.8 C), temperature source Oral, resp. rate 16, height '5\' 9"'$  (1.753 m), weight 127 kg (280 lb), last menstrual period 12/11/2015, SpO2 100 %.Body mass index is 41.35 kg/m.  General Appearance: Casual and Fairly Groomed  Eye Contact:  Good  Speech:  Clear and Coherent and Normal Rate  Volume:  Normal  Mood:  Depressed  Affect:  Appropriate, Congruent and Depressed  Thought Process:  Coherent, Goal Directed, Linear and Descriptions of Associations: Intact  Orientation:  Full (Time, Place, and Person)  Thought Content:  Symptoms, worries, concerns  Suicidal Thoughts:  No  Homicidal Thoughts:  No  Memory:  Immediate;   Fair Recent;   Fair Remote;   Fair  Judgement:  Fair  Insight:  Fair  Psychomotor Activity:  Normal   Concentration:  Concentration: Fair and Attention Span: Fair  Recall:  [employer] of Knowledge:  Fair  Language:  Fair  Akathisia:  No  Handed:    AIMS (if indicated):     Assets:  Communication Skills Desire for Improvement Resilience Social Support Talents/Skills  ADL's:  Intact  Cognition:  WNL  Sleep:  Number of Hours: 6   Treatment Plan Summary: Major depressive disorder, recurrent severe without psychotic features ([place]) unstable, managed as below:  I have reviewed and concur with current treatment plan as it is working well on 01/11/16.   Medications:  -Continue prozac '40mg'$  daily for MDD -Continue Seroquel '50mg'$  po qhs for insomnia -Continue Vistaril '50mg'$  po bid prn anxiety  Labs/tests:  Reviewed CBC, CMP, UDS and pt is hypokalemic -Ordered UPT STAT (Reviewed and negative) -12 lead EKG (reviewed QTC 454) -Redo CMP due to rising Creatinine and elevated AST (50) (reviewed and down at 0.87). K+ is improved at 4.0. AST improved at 27.    Benjamine Mola, FNP 01/11/2016, 10:17 AM

## 2016-01-11 NOTE — Progress Notes (Signed)
D: Patient has been pleasant and cooperative.  She rates her depressive symptoms low.  She rates her depression as a 2; hopelessness as a 0 and anxiety as a 3.  She denies any thoughts of self harm. She has been attending groups and participating in her treatment.  Patient states she is "looking forward."  "I need to find me a new job and get back to school."  Patient's goal is to "communicate effectively how I feel and what I need."  Patient is soft spoken and has a tendency to be withdrawn.  Patient had EKG completed this morning and reviewed with provider. A: Continue to monitor medication management and MD orders.  Safety checks completed every 15 minutes per protocol.  Offer support and encouragement as needed. R: Patient is receptive to staff; her behavior is appropriate.

## 2016-01-11 NOTE — Progress Notes (Signed)
Pt attended karaoke group this evening.  

## 2016-01-11 NOTE — Progress Notes (Signed)
Pt requested lavender and peppermint essential following aromatherapy group. Pt given a cotton ball with each oil as requested for alertness and relaxation for tonight.

## 2016-01-11 NOTE — [facility] Group Notes (Signed)
[facility] LCSW Group Therapy 01/11/2016 1:15 PM Type of Therapy: Group Therapy Participation Level: Active  Participation Quality: Attentive  Affect: Appropriate  Cognitive: Alert and Oriented  Insight: Developing/Improving and Engaged  Engagement in Therapy: Developing/Improving and Engaged  Modes of Intervention: Activity, Clarification, Confrontation, Discussion, Education, Exploration, Limit-setting, Orientation, Problem-solving, Rapport Building, Reality Testing, Socialization and Support  Summary of Progress/Problems: Patient was attentive and engaged with speaker from [employer]. Patient was attentive to speaker while they shared their story of dealing with mental health and overcoming it. Patient expressed interest in their programs and services and received information on their agency. Patient processed ways they can relate to the speaker.   Thania Woodlief, LCSW Clinical Social Worker [facility] [phone] 

## 2016-01-11 NOTE — Progress Notes (Signed)
Shannon [name] had been up and visible in milieu this evening, did attend and participate in evening group activity. Shannon [name] spoke about how she was wanting to talk to her therapist, denied any SI this evening and requested and signed 72 hour request for discharge. Shannon [name] did receive bedtime medication without incident. A. Support and encouragement provided. R. Safety maintained, will continue to monitor.

## 2016-01-11 NOTE — Progress Notes (Signed)
[facility] Group Notes:  (Nursing/MHT/Case Management/Adjunct)  Date:  01/11/2016  Time:  3:00 PM  Type of Therapy:  Nurse Education  Participation Level:  Active  Participation Quality:  Appropriate  Affect:  Appropriate  Cognitive:  Alert and Oriented  Insight:  Improving  Engagement in Group:  Engaged  Modes of Intervention:  Activity, Discussion, Education, Socialization and Support  Summary of Progress/Problems:The purpose of this group is to set and discuss a goal for today. The group was also introduced to the uses and benefits of aromatherapy. All pt's assessed for allergies and medical conditions before goup. Pt reports that her goal is to work on pleasing herself and quit being a people pleaser.  Shannon [name] 01/11/2016, 3:00 PM

## 2016-01-12 LAB — COMPREHENSIVE METABOLIC PANEL
ALK PHOS: 93 U/L (ref 38–126)
ALT: 20 U/L (ref 14–54)
AST: 27 U/L (ref 15–41)
Albumin: 3.8 g/dL (ref 3.5–5.0)
Anion gap: 8 (ref 5–15)
BUN: 5 mg/dL — ABNORMAL LOW (ref 6–20)
CALCIUM: 9 mg/dL (ref 8.9–10.3)
CO2: 22 mmol/L (ref 22–32)
CREATININE: 0.87 mg/dL (ref 0.44–1.00)
Chloride: 109 mmol/L (ref 101–111)
Glucose, Bld: 95 mg/dL (ref 65–99)
Potassium: 4 mmol/L (ref 3.5–5.1)
Sodium: 139 mmol/L (ref 135–145)
Total Bilirubin: 1 mg/dL (ref 0.3–1.2)
Total Protein: 7.7 g/dL (ref 6.5–8.1)

## 2016-01-12 NOTE — Progress Notes (Signed)
[facility] MD Progress Note  01/12/2016 10:14 AM Shannon [name]  MRN:  [id number] Subjective:  Patient was seen this morning and notes reviewed. Patient reports that she is beginning to feel better. States that her depression was situational and that she is trying to develop better coping skills to deal with her stress. States that she is doing well on her current medications. Denies any suicidal thoughts. States that her problems were more personal and some stress academically.  Principal Problem: Major depressive disorder, recurrent severe without psychotic features ([place]) Diagnosis:   Patient Active Problem List   Diagnosis Date Noted  . Major depressive disorder, recurrent severe without psychotic features ([place]) [F33.2] 01/10/2016  . Major depressive disorder, recurrent episode, mild ([place]) [F33.0] 01/01/2016  . Suicide ideation [R45.851] 02/16/2013   Total Time spent with patient: 25 minutes  Past Medical History:  Past Medical History:  Diagnosis Date  . Depression   . Medical history non-contributory     Past Surgical History:  Procedure Laterality Date  . NO PAST SURGERIES     Family History: History reviewed. No pertinent family history. Social History:  History  Alcohol Use No     History  Drug Use No    Social History   Social History  . Marital status: Single    Spouse name: N/A  . Number of children: N/A  . Years of education: N/A   Social History Main Topics  . Smoking status: Never Smoker  . Smokeless tobacco: Never Used  . Alcohol use No  . Drug use: No  . Sexual activity: No   Other Topics Concern  . None   Social History Narrative  . None   Additional Social History:                         Sleep: Good  Appetite:  Good  Current Medications: Current Facility-Administered Medications  Medication Dose Route Frequency Provider Last Rate Last Dose  . acetaminophen (TYLENOL) tablet 650 mg  650 mg Oral Q6H PRN Patrecia Pour, NP   650 mg at  01/10/16 2300  . alum & mag hydroxide-simeth (MAALOX/MYLANTA) 200-200-20 MG/5ML suspension 30 mL  30 mL Oral Q4H PRN Patrecia Pour, NP      . FLUoxetine (PROZAC) capsule 40 mg  40 mg Oral Daily Kerrie Buffalo, NP   40 mg at 01/12/16 4098  . hydrOXYzine (ATARAX/VISTARIL) tablet 50 mg  50 mg Oral BID BM & HS PRN Patrecia Pour, NP      . magnesium hydroxide (MILK OF MAGNESIA) suspension 30 mL  30 mL Oral Daily PRN Patrecia Pour, NP      . QUEtiapine (SEROQUEL) tablet 50 mg  50 mg Oral QHS Patrecia Pour, NP   50 mg at 01/11/16 2207    Lab Results:  Results for orders placed or performed during the hospital encounter of 01/10/16 (from the past 48 hour(s))  Pregnancy, urine     Status: None   Collection Time: 01/11/16  9:28 AM  Result Value Ref Range   Preg Test, Ur NEGATIVE NEGATIVE    Comment:        THE SENSITIVITY OF THIS METHODOLOGY IS >20 mIU/mL. Performed at [facility]   Comprehensive metabolic panel     Status: Abnormal   Collection Time: 01/12/16  6:38 AM  Result Value Ref Range   Sodium 139 135 - 145 mmol/L   Potassium 4.0 3.5 - 5.1 mmol/L  Chloride 109 101 - 111 mmol/L   CO2 22 22 - 32 mmol/L   Glucose, Bld 95 65 - 99 mg/dL   BUN 5 (L) 6 - 20 mg/dL   Creatinine, Ser 0.87 0.44 - 1.00 mg/dL   Calcium 9.0 8.9 - 10.3 mg/dL   Total Protein 7.7 6.5 - 8.1 g/dL   Albumin 3.8 3.5 - 5.0 g/dL   AST 27 15 - 41 U/L   ALT 20 14 - 54 U/L   Alkaline Phosphatase 93 38 - 126 U/L   Total Bilirubin 1.0 0.3 - 1.2 mg/dL   GFR calc non Af Amer >60 >60 mL/min   GFR calc Af Amer >60 >60 mL/min    Comment: (NOTE) The eGFR has been calculated using the CKD EPI equation. This calculation has not been validated in all clinical situations. eGFR's persistently <60 mL/min signify possible Chronic Kidney Disease.    Anion gap 8 5 - 15    Comment: Performed at [facility]    Blood Alcohol level:  Lab Results  Component Value Date   [facility] <5 01/09/2016    ETH <5 17/51/0258    Metabolic Disorder Labs: No results found for: HGBA1C, MPG No results found for: PROLACTIN No results found for: CHOL, TRIG, HDL, CHOLHDL, VLDL, LDLCALC  Physical Findings: AIMS: Facial and Oral Movements Muscles of Facial Expression: None, normal Lips and Perioral Area: None, normal Jaw: None, normal Tongue: None, normal,Extremity Movements Upper (arms, wrists, hands, fingers): None, normal Lower (legs, knees, ankles, toes): None, normal, Trunk Movements Neck, shoulders, hips: None, normal, Overall Severity Severity of abnormal movements (highest score from questions above): None, normal Incapacitation due to abnormal movements: None, normal Patient's awareness of abnormal movements (rate only patient's report): No Awareness, Dental Status Current problems with teeth and/or dentures?: No Does patient usually wear dentures?: No  CIWA:    COWS:     Musculoskeletal: Strength & Muscle Tone: within normal limits Gait & Station: normal Patient leans: N/A  Psychiatric Specialty Exam: Physical Exam  Review of Systems  Psychiatric/Behavioral: Positive for depression. Negative for hallucinations, substance abuse and suicidal ideas. The patient is nervous/anxious and has insomnia.   All other systems reviewed and are negative.   Blood pressure 127/86, pulse (!) 112, temperature (!) 100.7 F (38.2 C), resp. rate 17, height _0  (1.753 m), weight 280 lb (127 kg), last menstrual period 12/11/2015, SpO2 100 %.Body mass index is 41.35 kg/m.  General Appearance: Casual and Fairly Groomed  Eye Contact:  Good  Speech:  Clear and Coherent and Normal Rate  Volume:  Normal  Mood:  Improving   Affect:  Appropriate, Congruent and Depressed  Thought Process:  Coherent, Goal Directed, Linear and Descriptions of Associations: Intact  Orientation:  Full (Time, Place, and Person)  Thought Content:  Symptoms, worries, concerns  Suicidal Thoughts:  No  Homicidal Thoughts:   No  Memory:  Immediate;   Fair Recent;   Fair Remote;   Fair  Judgement:  Fair  Insight:  Fair  Psychomotor Activity:  Normal  Concentration:  Concentration: Fair and Attention Span: Fair  Recall:  [employer] of Knowledge:  Fair  Language:  Fair  Akathisia:  No  Handed:    AIMS (if indicated):     Assets:  Communication Skills Desire for Improvement Resilience Social Support Talents/Skills  ADL's:  Intact  Cognition:  WNL  Sleep:  Number of Hours: 5.5   Treatment Plan Summary: Major depressive disorder, recurrent severe without  psychotic features ([place]) unstable, managed as below:  Therapy Patient encouraged to participate in groups and focus on developing coping skills to be continued on intensive basis outpatient.  Medications:  -Continue prozac 50m daily for MDD -Continue Seroquel 547mpo qhs for insomnia -Continue Vistaril 5073mo bid prn anxiety  Labs/tests:  Reviewed CBC, CMP, UDS and pt is hypokalemic -Ordered UPT STAT (Reviewed and negative) -12 lead EKG (reviewed QTC 454) -Redo CMP due to rising Creatinine and elevated AST (50) (reviewed and down at 0.87). K+ is improved at 4.0. AST improved at 27.    RAVElvin SoD 01/12/2016, 10:14 AM

## 2016-01-12 NOTE — Plan of Care (Signed)
Problem: Safety: Goal: Periods of time without injury will increase Outcome: Progressing Pt has not harmed self or others since admission.  She denies SI/HI and verbally contracts for safety.

## 2016-01-12 NOTE — Plan of Care (Signed)
Problem: Medication: Goal: Compliance with prescribed medication regimen will improve Outcome: Progressing Pt has been compliant with scheduled medication tonight.

## 2016-01-12 NOTE — Tx Team (Signed)
Interdisciplinary Treatment and Diagnostic Plan Update  01/12/2016 Time of Session: 9:30am Shannon [name] MRN: [id number]  Principal Diagnosis: Major depressive disorder, recurrent severe without psychotic features [facility])  Secondary Diagnoses: Principal Problem:   Major depressive disorder, recurrent severe without psychotic features ([place])   Current Medications:  Current Facility-Administered Medications  Medication Dose Route Frequency Provider Last Rate Last Dose  . acetaminophen (TYLENOL) tablet 650 mg  650 mg Oral Q6H PRN Patrecia Pour, NP   650 mg at 01/10/16 2300  . alum & mag hydroxide-simeth (MAALOX/MYLANTA) 200-200-20 MG/5ML suspension 30 mL  30 mL Oral Q4H PRN Patrecia Pour, NP      . FLUoxetine (PROZAC) capsule 40 mg  40 mg Oral Daily Kerrie Buffalo, NP   40 mg at 01/12/16 [id number]  . hydrOXYzine (ATARAX/VISTARIL) tablet 50 mg  50 mg Oral BID BM & HS PRN Patrecia Pour, NP      . magnesium hydroxide (MILK OF MAGNESIA) suspension 30 mL  30 mL Oral Daily PRN Patrecia Pour, NP      . QUEtiapine (SEROQUEL) tablet 50 mg  50 mg Oral QHS Patrecia Pour, NP   50 mg at 01/11/16 2207   PTA Medications: Prescriptions Prior to Admission  Medication Sig Dispense Refill Last Dose  . buPROPion (WELLBUTRIN XL) 150 MG 24 hr tablet Take 1 tablet (150 mg total) by mouth daily. For Depression. 30 tablet 0 Not Taking at Unknown time  . Ferrous Sulfate Dried 45 MG TBCR Take 1 tablet by mouth every morning.   01/09/2016 at Unknown time  . FLUoxetine (PROZAC) 40 MG capsule Take 40 mg by mouth every morning.   01/09/2016 at Unknown time  . hydrOXYzine (ATARAX/VISTARIL) 50 MG tablet Take 1 tablet (50 mg total) by mouth 3 times/day as needed-between meals & bedtime for anxiety (sleep). 30 tablet 0 01/09/2016 at Unknown time  . levonorgestrel-ethinyl estradiol (AVIANE,ALESSE,LESSINA) 0.1-20 MG-MCG tablet Take 1 tablet by mouth every morning.   01/08/2016 at Unknown time  . QUEtiapine (SEROQUEL) 50 MG  tablet Take 50 mg by mouth at bedtime.   01/08/2016 at Unknown time  . traZODone (DESYREL) 50 MG tablet Take 1 tablet (50 mg total) by mouth at bedtime and may repeat dose one time if needed. (Patient not taking: Reported on 01/10/2016) 60 tablet 0 Not Taking at Unknown time    Patient Stressors: Educational concerns  Patient Strengths: Average or above average intelligence Capable of independent living Communication skills Physical Health Supportive family/friends  Treatment Modalities: Medication Management, Group therapy, Case management,  1 to 1 session with clinician, Psychoeducation, Recreational therapy.   Physician Treatment Plan for Primary Diagnosis: Major depressive disorder, recurrent severe without psychotic features ([place]) Long Term Goal(s): Improvement in symptoms so as ready for discharge Improvement in symptoms so as ready for discharge   Short Term Goals: Ability to identify changes in lifestyle to reduce recurrence of condition will improve Ability to verbalize feelings will improve Ability to disclose and discuss suicidal ideas Ability to demonstrate self-control will improve Ability to identify and develop effective coping behaviors will improve Ability to maintain clinical measurements within normal limits will improve Ability to identify changes in lifestyle to reduce recurrence of condition will improve Ability to verbalize feelings will improve Ability to disclose and discuss suicidal ideas Ability to demonstrate self-control will improve Ability to identify and develop effective coping behaviors will improve Ability to maintain clinical measurements within normal limits will improve  Medication Management: Evaluate patient's response, side effects,  and tolerance of medication regimen.  Therapeutic Interventions: 1 to 1 sessions, Unit Group sessions and Medication administration.  Evaluation of Outcomes: Adequate for Discharge  Physician Treatment Plan  for Secondary Diagnosis: Principal Problem:   Major depressive disorder, recurrent severe without psychotic features ([place])  Long Term Goal(s): Improvement in symptoms so as ready for discharge Improvement in symptoms so as ready for discharge   Short Term Goals: Ability to identify changes in lifestyle to reduce recurrence of condition will improve Ability to verbalize feelings will improve Ability to disclose and discuss suicidal ideas Ability to demonstrate self-control will improve Ability to identify and develop effective coping behaviors will improve Ability to maintain clinical measurements within normal limits will improve Ability to identify changes in lifestyle to reduce recurrence of condition will improve Ability to verbalize feelings will improve Ability to disclose and discuss suicidal ideas Ability to demonstrate self-control will improve Ability to identify and develop effective coping behaviors will improve Ability to maintain clinical measurements within normal limits will improve     Medication Management: Evaluate patient's response, side effects, and tolerance of medication regimen.  Therapeutic Interventions: 1 to 1 sessions, Unit Group sessions and Medication administration.  Evaluation of Outcomes: Adequate for Discharge   RN Treatment Plan for Primary Diagnosis: Major depressive disorder, recurrent severe without psychotic features ([place]) Long Term Goal(s): Knowledge of disease and therapeutic regimen to maintain health will improve  Short Term Goals: Ability to remain free from injury will improve, Ability to demonstrate self-control, Ability to verbalize feelings will improve, Ability to disclose and discuss suicidal ideas, Ability to identify and develop effective coping behaviors will improve and Compliance with prescribed medications will improve  Medication Management: RN will administer medications as ordered by provider, will assess and evaluate patient's  response and provide education to patient for prescribed medication. RN will report any adverse and/or side effects to prescribing provider.  Therapeutic Interventions: 1 on 1 counseling sessions, Psychoeducation, Medication administration, Evaluate responses to treatment, Monitor vital signs and CBGs as ordered, Perform/monitor CIWA, COWS, AIMS and Fall Risk screenings as ordered, Perform wound care treatments as ordered.  Evaluation of Outcomes: Adequate for Discharge   LCSW Treatment Plan for Primary Diagnosis: Major depressive disorder, recurrent severe without psychotic features ([place]) Long Term Goal(s): Safe transition to appropriate next level of care at discharge, Engage patient in therapeutic group addressing interpersonal concerns.  Short Term Goals: Engage patient in aftercare planning with referrals and resources, Increase social support, Increase emotional regulation, Identify triggers associated with mental health/substance abuse issues and Increase skills for wellness and recovery  Therapeutic Interventions: Assess for all discharge needs, 1 to 1 time with Social worker, Explore available resources and support systems, Assess for adequacy in community support network, Educate family and significant other(s) on suicide prevention, Complete Psychosocial Assessment, Interpersonal group therapy.  Evaluation of Outcomes: Adequate for Discharge   Progress in Treatment :  Attending groups: Yes  Participating in groups: Continuing to assess  Taking medication as prescribed: Yes, MD continuing to assess for appropriate medication regimen  Toleration medication: Yes  Family/Significant other contact made: Yes, CSW has spoken with patient's aunt  Patient understands diagnosis: Yes  Discussing patient identified problems/goals with staff: Yes  Medical problems stabilized or resolved: Yes  Denies suicidal/homicidal ideation: Yes, denies  Issues/concerns per patient  self-inventory: None reported  Other: N/A  New problem(s) identified: None reported at this time    New Short Term/Long Term Goal(s): None at this time    Discharge  Plan or Barriers: Patient plans to return home to follow up with current outpatient services.     Reason for Continuation of Hospitalization: Anxiety Depression Medication stabilization Suicidal Ideations Withdrawal symptoms  Estimated Length of Stay: Discharge anticipated for tomorrow 01/13/16    Attendees: Patient: 01/12/2016   Physician: Army Chaco, Dr. Einar Grad MD 01/12/2016   Nursing: Shirlean Mylar., Jan W., RN 01/12/2016   RN Care Manager:  01/12/2016   Social Worker: Erasmo Downer Rubylee Zamarripa, LCSW 01/12/2016   Recreational Therapist:  01/12/2016   Other: Norberto Sorenson, Pal[place] 01/12/2016   Other: Eston Mould., May A., NP 01/12/2016   Other: 01/12/2016     Scribe for Treatment Team: Casimiro Needle Sadye Kiernan, LCSW 01/12/2016

## 2016-01-12 NOTE — [facility] Suicide Risk Assessment (Signed)
[place] INPATIENT:  Family/Significant Other Suicide Prevention Education  Suicide Prevention Education:  Education Completed; aunt Marti Sleigh [phone],  (name of family member/significant other) has been identified by the patient as the family member/significant other with whom the patient will be residing, and identified as the person(s) who will aid the patient in the event of a mental health crisis (suicidal ideations/suicide attempt).  With written consent from the patient, the family member/significant other has been provided the following suicide prevention education, prior to the and/or following the discharge of the patient.  The suicide prevention education provided includes the following:  Suicide risk factors  Suicide prevention and interventions  National Suicide Hotline telephone number  [facility] assessment telephone number  [facility] Emergency Assistance [place] and/or Residential Mobile Crisis Unit telephone number  Request made of family/significant other to:  Remove weapons (e.g., guns, rifles, knives), all items previously/currently identified as safety concern.    Remove drugs/medications (over-the-counter, prescriptions, illicit drugs), all items previously/currently identified as a safety concern.  The family member/significant other verbalizes understanding of the suicide prevention education information provided.  The family member/significant other agrees to remove the items of safety concern listed above.  Shannon [name] 01/12/2016, 4:16 PM

## 2016-01-12 NOTE — [facility] Group Notes (Signed)
[place] LCSW Group Therapy 01/12/2016 1:15 PM Type of Therapy: Group Therapy Participation Level: Minimal  Participation Quality: Attentive  Affect: Blunted  Cognitive: Alert and Oriented  Insight: Developing/Improving and Engaged  Engagement in Therapy: Developing/Improving and Engaged  Modes of Intervention: Clarification, Confrontation, Discussion, Education, Exploration, Limit-setting, Orientation, Problem-solving, Rapport Building, Art therapist, Socialization and Support  Summary of Progress/Problems: The topic for today was feelings about relapse. Pt discussed what relapse prevention is to them and identified triggers that they are on the path to relapse. Pt processed their feeling towards relapse and was able to relate to peers. Pt discussed coping skills that can be used for relapse prevention.    Tilden Fossa, MSW, [place] Clinical Social Worker [facility] [phone]

## 2016-01-12 NOTE — Progress Notes (Signed)
D: Pt was in the day room upon initial approach.  Pt presents with anxious affect and pleasant mood.  Pt reports her gaol today is to "try to make myself happy."  Pt states "Nicki Reaper helped me a lot in group" by suggesting potential sources of support.  Pt reports she will be involved in "social groups" after discharge to try to get more peer support.  Pt denies SI/HI, denies hallucinations, reports back pain of 3/10.  Pt has been visible in milieu interacting with peers and staff appropriately.  Pt attended evening karaoke group.   A: Introduced self to pt.  Actively listened to pt and offered support and encouragement. Medication administered per order.  Heat pack provided for pain.  R: Pt is safe on the unit.  Pt is compliant with medications.  Pt verbally contracts for safety.  Will continue to monitor and assess.

## 2016-01-12 NOTE — Progress Notes (Addendum)
  [facility] Adult Case Management Discharge Plan :  Will you be returning to the same living situation after discharge:  Yes,  patient plans to return home At discharge, do you have transportation home?: Yes,  bus pass  Do you have the ability to pay for your medications: Yes,  patient to be provided with prescriptions at discharge  Release of information consent forms completed and in the chart;  Patient's signature needed at discharge.  Patient to Follow up at: Follow-up Information    [facility] Follow up on 02/08/2016.   Why:  Please go to regularly scheduled appt at 8:20am with [name] Richer for medication management services. Call if you need to reschedule. Ladona Horns will contact you within 2-3 business days after discharge to schedule crisis management appt.  Contact information: [address] [place],  [address] [phone]       [facility] Follow up on 01/17/2016.   Why:  Please go to regularly scheduled therapy appt at 9am with Suzanne Boron. Call office if you need to reschedule.  Contact information: [address] [place], [place],  [address] [phone]          Next level of care provider has access to [place] and Suicide Prevention discussed: Yes,  with patient and aunt  Have you used any form of tobacco in the last 30 days? (Cigarettes, Smokeless Tobacco, Cigars, and/or Pipes): No  Has patient been referred to the Quitline?: N/A patient is not a smoker  Patient has been referred for addiction treatment: Yes  Caulin Begley L Mattison Golay 01/12/2016, 4:18 PM

## 2016-01-12 NOTE — Progress Notes (Signed)
D- Patient alert and oriented.  Patient appears anxious and childlike this shift.  Patient currently denies SI, HI, AVH, and pain.  No complaints.  Patient reports that she slept "much better" than on previous nights.   On patient's self-inventory, she rates her depression "1" and denies feelings of hopelessness and anxiety.   A- Scheduled medications administered to patient, per MD orders. Support and encouragement provided.  Routine safety checks conducted every 15 minutes.  Patient informed to notify staff with problems or concerns. R- No adverse drug reactions noted. Patient contracts for safety at this time. Patient compliant with medications and treatment plan. Patient receptive, calm, and cooperative. Patient interacts well with others on the unit.  Patient remains safe at this time.

## 2016-01-12 NOTE — Progress Notes (Signed)
Recreation Therapy Notes  Date: 01/12/16 Time: 0930 Location: 300 Group Room  Group Topic: Stress Management  Goal Area(s) Addresses:  Patient will verbalize importance of using healthy stress management.  Patient will identify positive emotions associated with healthy stress management.   Behavioral Response: Engaged  Intervention: Stress Management  Activity :  Peaceful Waves Imagery.  LRT introduced the stress management of guided imagery.  LRT read a script to allow patients to participate in the technique.  Patients were to follow along as LRT read script.  Education:  Stress Management, Discharge Planning.   Education Outcome: Acknowledges edcuation/In group clarification offered/Needs additional education  Clinical Observations/Feedback:  Pt attended group.   Victorino Sparrow, LRT/CTRS         Victorino Sparrow A 01/12/2016 12:26 PM
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
## 2016-01-12 NOTE — Progress Notes (Signed)
D: Pt was in the day room upon initial approach.  Pt presents with anxious affect and pleasant mood.  Pt reports her day was "good" and the best part of her day was "playing basketball outside, everyone had time to talk and engage everyone."  Pt denies SI/HI, denies hallucinations, denies pain.  Pt has been visible in milieu interacting with peers and staff cautiously.  Pt attended evening group.   A:  Actively listened to pt and offered support and encouragement. Medications administered per order.   R: Pt is safe on the unit.  Pt is compliant with medications.  Pt verbally contracts for safety.  Will continue to monitor and assess.

## 2016-01-13 MED ORDER — HYDROXYZINE HCL 50 MG PO TABS: 50.0000 mg | ORAL_TABLET | Freq: Two times a day (BID) | ORAL | 0 refills | Status: DC | PRN

## 2016-01-13 MED ORDER — QUETIAPINE FUMARATE 50 MG PO TABS: 50.0000 mg | ORAL_TABLET | Freq: Every day | ORAL | 0 refills | Status: DC

## 2016-01-13 MED ORDER — FLUOXETINE HCL 40 MG PO CAPS: 40.0000 mg | ORAL_CAPSULE | Freq: Every day | ORAL | 0 refills | Status: DC

## 2016-01-13 NOTE — Discharge Summary (Signed)
Physician Discharge Summary Note  Patient:  Shannon [name] is an 25 y.o., female MRN:  [id number] DOB:  October 15, 1990 Patient phone:  [phone] (home)  Patient address:   [place] [address],  Total Time spent with patient: 30 minutes  Date of Admission:  01/10/2016 Date of Discharge: 01/13/2016  Reason for Admission:Per HPI- Shannon [name] an 25 y.o.female. Pt presents with depressed mood and blunted affect. Pt is oriented. Pt providing apathetic and vague responses. Assessment information obtained from pt interview, clinical observation, and review of pt chart. Pt presents voluntarily via [employer]. Pt volunteers at [employer] and communicated to staff suicidal ideation with plan to jump off of a bridge. Pt also communicated plan to come to the museum and stab herself in front of museum staff members. Pt also presented to ED on 10.8.17 with c/o depression, headaches, and recent suicidal ideation with plan to drive her car off of a bridge. When asked about current suicidal ideation pt states "I guess so"and "I'm fine now". Pt does not provide details regarding prior to arrival events/suicidal statements. Pt provides vague responses regarding current suicidal intent and history of suicide attempt. Pt denies homicidal ideation and hallucinations. Pt reports history of "I don't know what they are calling it now. I'll go with depression."Pt reports history of two inpatient admissions (2016 Shannon [name], Shannon [name]). Pt is followed by Delrae Alfred and Suzanne Boron for psychiatric treatment. Today on 01/10/16, Pt seen and chart reviewed. Pt is alert/oriented x4, calm, cooperative, and appropriate to situation. Pt denies suicidal/homicidal ideation and psychosis and does not appear to be responding to internal stimuli. Pt is minimizing the severity of her symptoms and her suicidal ideation, stating that she "feels good" and that was just in the moment. However, pt reports that she is  concerned about her coping skills and feels that she is very reactive and easily triggered by acute situations that "put me over the edge." When discussing medication management and possible upward titration of her Prozac, pt reports that Meghan (our former [name] colleague) and current provider, did increase her Prozac to 60 which caused extreme headaches and just moved it back to 40mg  and added the Seroquel. Therefore, pt is not receptive to medication changes today but reports that she would like to work on her coping skills today and will discuss possible medication changes tomorrow.  Principal Problem: Major depressive disorder, recurrent severe without psychotic features Shannon [name]) Discharge Diagnoses: Patient Active Problem List   Diagnosis Date Noted  . Major depressive disorder, recurrent severe without psychotic features ([place]) [F33.2] 01/10/2016  . Major depressive disorder, recurrent episode, mild ([place]) [F33.0] 01/01/2016  . Suicide ideation [R45.851] 02/16/2013    Past Psychiatric History:  Past Medical History:  Past Medical History:  Diagnosis Date  . Depression   . Medical history non-contributory     Past Surgical History:  Procedure Laterality Date  . NO PAST SURGERIES     Family History: History reviewed. No pertinent family history. Family Psychiatric  History: Social History:  History  Alcohol Use No     History  Drug Use No    Social History   Social History  . Marital status: Single    Spouse name: N/A  . Number of children: N/A  . Years of education: N/A   Social History Main Topics  . Smoking status: Never Smoker  . Smokeless tobacco: Never Used  . Alcohol use No  . Drug use: No  . Sexual activity: No  Other Topics Concern  . None   Social History Narrative  . None    Hospital Course:  Kinzie Giger was admitted for Major depressive disorder, recurrent severe without psychotic features (Shannon [name])  and crisis management.  Pt was treated discharged with  the medications listed below under Medication List.  Medical problems were identified and treated as needed.  Home medications were restarted as appropriate.  Improvement was monitored by observation and Marcene Brawn 's daily report of symptom reduction.  Emotional and mental status was monitored by daily self-inventory reports completed by Marcene Brawn and clinical staff.         Lolitha Frana was evaluated by the treatment team for stability and plans for continued recovery upon discharge. Jyah Bonnet 's motivation was an integral factor for scheduling further treatment. Employment, transportation, bed availability, health status, family support, and any pending legal issues were also considered during hospital stay. Pt was offered further treatment options upon discharge including but not limited to Residential, Intensive Outpatient, and Outpatient treatment.  Aubreana Golde will follow up with the services as listed below under Follow Up Information.     Upon completion of this admission the patient was both mentally and medically stable for discharge denying suicidal/homicidal ideation, auditory/visual/tactile hallucinations, delusional thoughts and paranoia.    Marcene Brawn responded well to treatment with Prozac 40 mg, and Seroquel 50 mg without adverse effects.  Pt demonstrated improvement without reported or observed adverse effects to the point of stability appropriate for outpatient management. Pertinent labs include: CMP, CBC for which outpatient follow-up is necessary for lab recheck as mentioned below. Reviewed CBC, CMP, BAL, and UDS; all unremarkable aside from noted exceptions.   Physical Findings: AIMS: Facial and Oral Movements Muscles of Facial Expression: None, normal Lips and Perioral Area: None, normal Jaw: None, normal Tongue: None, normal,Extremity Movements Upper (arms, wrists, hands, fingers): None, normal Lower (legs, knees, ankles, toes): None, normal, Trunk  Movements Neck, shoulders, hips: None, normal, Overall Severity Severity of abnormal movements (highest score from questions above): None, normal Incapacitation due to abnormal movements: None, normal Patient's awareness of abnormal movements (rate only patient's report): No Awareness, Dental Status Current problems with teeth and/or dentures?: No Does patient usually wear dentures?: No  CIWA:    COWS:     Musculoskeletal: Strength & Muscle Tone: within normal limits Gait & Station: normal Patient leans: N/A  Psychiatric Specialty Exam: Physical Exam  Nursing note and vitals reviewed. Constitutional: She is oriented to person, place, and time. She appears well-developed.  Neurological: She is alert and oriented to person, place, and time.  Skin: Skin is warm and dry.    Review of Systems  Psychiatric/Behavioral: Negative for hallucinations and suicidal ideas. Depression: stable. The patient is not nervous/anxious.     Blood pressure 133/79, pulse 94, temperature 98.4 F (36.9 C), resp. rate 16, height 5\' 9"  (1.753 m), weight 127 kg (280 lb), last menstrual period 12/11/2015, SpO2 100 %.Body mass index is 41.35 kg/m.   Have you used any form of tobacco in the last 30 days? (Cigarettes, Smokeless Tobacco, Cigars, and/or Pipes): No  Has this patient used any form of tobacco in the last 30 days? (Cigarettes, Smokeless Tobacco, Cigars, and/or Pipes)  No  Blood Alcohol level:  Lab Results  Component Value Date   ETH <5 01/09/2016   ETH <5 [id number]    Metabolic Disorder Labs:  No results found for: HGBA1C, MPG No results found for: PROLACTIN No results found for: CHOL, TRIG,  HDL, CHOLHDL, VLDL, LDLCALC  See Psychiatric Specialty Exam and Suicide Risk Assessment completed by Attending Physician prior to discharge.  Discharge destination:  Home  Is patient on multiple antipsychotic therapies at discharge:  No   Has Patient had three or more failed trials of antipsychotic  monotherapy by history:  No  Recommended Plan for Multiple Antipsychotic Therapies: NA  Discharge Instructions    Diet - low sodium heart healthy    Complete by:  As directed    Discharge instructions    Complete by:  As directed    Take all medications as prescribed. Keep all follow-up appointments as scheduled.  Do not consume alcohol or use illegal drugs while on prescription medications. Report any adverse effects from your medications to your primary care provider promptly.  In the event of recurrent symptoms or worsening symptoms, call 911, a crisis hotline, or go to the nearest emergency department for evaluation.   Increase activity slowly    Complete by:  As directed        Medication List    STOP taking these medications   buPROPion 150 MG 24 hr tablet Commonly known as:  WELLBUTRIN XL   Ferrous Sulfate Dried 45 MG Tbcr   traZODone 50 MG tablet Commonly known as:  DESYREL     TAKE these medications     Indication  FLUoxetine 40 MG capsule Commonly known as:  PROZAC Take 1 capsule (40 mg total) by mouth daily. Start taking on:  01/14/2016 What changed:  when to take this  Indication:  Major Depressive Disorder   hydrOXYzine 50 MG tablet Commonly known as:  ATARAX/VISTARIL Take 1 tablet (50 mg total) by mouth 3 times/day as needed-between meals & bedtime for anxiety (sleep).  Indication:  Anxiety associated with Organic Disease   levonorgestrel-ethinyl estradiol 0.1-20 MG-MCG tablet Commonly known as:  AVIANE,ALESSE,LESSINA Take 1 tablet by mouth every morning.  Indication:  Absence of Menstrual Periods   QUEtiapine 50 MG tablet Commonly known as:  SEROQUEL Take 1 tablet (50 mg total) by mouth at bedtime.  Indication:  Depressive Phase of Manic-Depression      Follow-up Information    [place] Follow up on 02/08/2016.   Why:  Please go to regularly scheduled appt at 8:20am with [name] Richer for medication management services. Call if  you need to reschedule. Ladona Horns will contact you within 2-3 business days after discharge to schedule crisis management appt.  Contact information: [address] [place], [place] [address] [phone]       [facility] Follow up on 01/17/2016.   Why:  Please go to regularly scheduled therapy appt at 9am with Suzanne Boron. Call office if you need to reschedule.  Contact information: [address] [place], [place],  [address] [phone]          Follow-up recommendations:  Activity:  as tolerated Diet:  heart healthy  Comments: Take all medications as prescribed. Keep all follow-up appointments as scheduled.  Do not consume alcohol or use illegal drugs while on prescription medications. Report any adverse effects from your medications to your primary care provider promptly.  In the event of recurrent symptoms or worsening symptoms, call 911, a crisis hotline, or go to the nearest emergency department for evaluation.   Signed: Derrill Center, NP 01/13/2016, 9:12 AM I have examined the patient and agree with the discharge plan and findings. I have also done suicide assessment on this patient.

## 2016-01-13 NOTE — BHH Suicide Risk Assessment (Signed)
[facility] Discharge Suicide Risk Assessment   Principal Problem: Major depressive disorder, recurrent severe without psychotic features [facility]) Discharge Diagnoses:  Patient Active Problem List   Diagnosis Date Noted  . Major depressive disorder, recurrent severe without psychotic features ([place]) [F33.2] 01/10/2016  . Major depressive disorder, recurrent episode, mild ([place]) [F33.0] 01/01/2016  . Suicide ideation [R45.851] 02/16/2013    Total Time spent with patient: 30 minutes  Musculoskeletal: Strength & Muscle Tone: within normal limits Gait & Station: normal Patient leans: no lean  Psychiatric Specialty Exam: ROS: no chest pain, nausea . No tremors  Blood pressure 133/79, pulse 94, temperature 98.4 F (36.9 C), resp. rate 16, height 5\' 9"  (1.753 m), weight 127 kg (280 lb), last menstrual period 12/11/2015, SpO2 100 %.Body mass index is 41.35 kg/m.  General Appearance: Casual  Eye Contact::  Fair  Speech:  Normal Rate409  Volume:  Decreased  Mood:  Euthymic  Affect:  Constricted  Thought Process:  Goal Directed  Orientation:  Full (Time, Place, and Person)  Thought Content:  Rumination  Suicidal Thoughts:  No  Homicidal Thoughts:  No  Memory:  Immediate;   Fair Recent;   Fair  Judgement:  Fair  Insight:  Fair  Psychomotor Activity:  Decreased  Concentration:  Fair  Recall:  [employer] of Knowledge:Fair  Language: Fair  Akathisia:  Negative  Handed:  Right  AIMS (if indicated):     Assets:  Desire for Improvement  Sleep:  Number of Hours: 5.5  Cognition: WNL  ADL's:  Intact   Mental Status Per Nursing Assessment::   On Admission:     Demographic Factors:  Adolescent or young adult and Low socioeconomic status  Loss Factors: Financial problems/change in socioeconomic status  Historical Factors: Impulsivity  Risk Reduction Factors:   Living with another person, especially a relative and Positive therapeutic relationship  Continued Clinical Symptoms:   Dysthymia Previous Psychiatric Diagnoses and Treatments  Cognitive Features That Contribute To Risk:  None    Suicide Risk:  Minimal: No identifiable suicidal ideation.  Patients presenting with no risk factors but with morbid ruminations; may be classified as minimal risk based on the severity of the depressive symptoms  Follow-up [place] Follow up on 02/08/2016.   Why:  Please go to regularly scheduled appt at 8:20am with [name] Richer for medication management services. Call if you need to reschedule. Ladona Horns will contact you within 2-3 business days after discharge to schedule crisis management appt.  Contact information: [address] [place], [place] [address] [phone]       [facility] Follow up on 01/17/2016.   Why:  Please go to regularly scheduled therapy appt at 9am with Suzanne Boron. Call office if you need to reschedule.  Contact information: [address], [place], [place], [place] [address] [phone] ext [phone]        See Discharge summary for details.  Follow up with recommendations and medication compliance.   Plan Of Care/Follow-up recommendations:  Activity:  as tolerated Diet:  regular  Merian Capron, MD 01/13/2016, 10:21 AM

## 2016-01-13 NOTE — Progress Notes (Signed)
Discharge  D- Patient verbalizes readiness for discharge: Denies SI/HI, is not psychotic or delusional. A- Discharge instructions read and discussed with patient.  All belongings returned to patient. R- Patient cooperative with discharge process.  Patient verbalizes understanding of discharge instructions.  Signed for return of belongings. Patient given bus pass. Escorted to the lobby for discharge.

## 2016-01-13 NOTE — BHH Group Notes (Signed)
[facility] Group Notes:  (Clinical Social Work)   01/13/2016      10:00-11:00AM  Summary of Progress/Problems:   Today's process group explored the perceived benefits and costs of unhealthy coping techniques, as well as the benefits and costs of replacing these with healthy coping skills.  Patients provided support to one another as they shared feelings of being overwhelmed, scared, and unable to make a decision about whether to relinquish a specific unhealthy coping technique.  Motivational Interviewing was utilized to highlight patients' ambivalence and an explanation of the Stages of Change was made to highlight that each patient must determine their willingness and motivation to change individually.  Celebrating incremental successes was discussed as a method to address fear of failure.  At the beginning of group the patient expressed that one unhealthy coping they often use is "extreme people pleasing."  She talked about fear of change and was able to acknowledge how much she had in common with others in the group.  Type of Therapy:  Group Therapy - Process   Participation Level:  Active  Participation Quality:  Attentive and Sharing  Affect:  Blunted and Depressed and Anxious  Cognitive:  Appropriate and Oriented  Insight:  Developing/Improving  Engagement in Therapy:  Engaged  Modes of Intervention:  Education, Motivational Interviewing  Selmer Dominion, [place] 12:38 PM    01/13/2016

## 2016-03-01 DIAGNOSIS — D509 Iron deficiency anemia, unspecified: Secondary | ICD-10-CM | POA: Diagnosis not present

## 2016-03-19 ENCOUNTER — Ambulatory Visit: Payer: Self-pay | Admitting: Neurology

## 2016-03-30 ENCOUNTER — Encounter (HOSPITAL_COMMUNITY): Payer: Self-pay | Admitting: Emergency Medicine

## 2016-03-30 ENCOUNTER — Emergency Department (HOSPITAL_COMMUNITY)
Admission: EM | Admit: 2016-03-30 | Discharge: 2016-03-31 | Disposition: A | Discharge status: Home / Self Care | Payer: BLUE CROSS/BLUE SHIELD | Attending: Emergency Medicine | Admitting: Emergency Medicine

## 2016-03-30 DIAGNOSIS — F332 Major depressive disorder, recurrent severe without psychotic features: Secondary | ICD-10-CM | POA: Diagnosis not present

## 2016-03-30 DIAGNOSIS — F4329 Adjustment disorder with other symptoms: Secondary | ICD-10-CM | POA: Diagnosis present

## 2016-03-30 DIAGNOSIS — F329 Major depressive disorder, single episode, unspecified: Secondary | ICD-10-CM | POA: Diagnosis not present

## 2016-03-30 DIAGNOSIS — F32A Depression, unspecified: Secondary | ICD-10-CM

## 2016-03-30 DIAGNOSIS — R45851 Suicidal ideations: Secondary | ICD-10-CM

## 2016-03-30 DIAGNOSIS — Z79899 Other long term (current) drug therapy: Secondary | ICD-10-CM | POA: Insufficient documentation

## 2016-03-30 DIAGNOSIS — F330 Major depressive disorder, recurrent, mild: Secondary | ICD-10-CM | POA: Diagnosis present

## 2016-03-30 LAB — PREGNANCY, URINE: Preg Test, Ur: NEGATIVE

## 2016-03-30 LAB — ACETAMINOPHEN LEVEL: Acetaminophen (Tylenol), Serum: <10 ug/mL — ABNORMAL LOW (ref 10–30)

## 2016-03-30 LAB — COMPREHENSIVE METABOLIC PANEL
ALBUMIN: 4.2 g/dL (ref 3.5–5.0)
ALT: 13 U/L — ABNORMAL LOW (ref 14–54)
AST: 21 U/L (ref 15–41)
Alkaline Phosphatase: 90 U/L (ref 38–126)
Anion gap: 9 (ref 5–15)
BUN: 6 mg/dL (ref 6–20)
CHLORIDE: 107 mmol/L (ref 101–111)
CO2: 23 mmol/L (ref 22–32)
Calcium: 8.9 mg/dL (ref 8.9–10.3)
Creatinine, Ser: 0.87 mg/dL (ref 0.44–1.00)
GFR calc Af Amer: >60 mL/min (ref >60)
GFR calc non Af Amer: >60 mL/min (ref >60)
GLUCOSE: 105 mg/dL — AB (ref 65–99)
Potassium: 3.6 mmol/L (ref 3.5–5.1)
Sodium: 139 mmol/L (ref 135–145)
Total Bilirubin: 1 mg/dL (ref 0.3–1.2)
Total Protein: 8.5 g/dL — ABNORMAL HIGH (ref 6.5–8.1)

## 2016-03-30 LAB — ETHANOL: Alcohol, Ethyl (B): <5 mg/dL (ref <5)

## 2016-03-30 LAB — SALICYLATE LEVEL: Salicylate Lvl: <7 mg/dL (ref 2.8–30.0)

## 2016-03-30 LAB — CBC
HEMATOCRIT: 38.7 % (ref 36.0–46.0)
HEMOGLOBIN: 11.9 g/dL — AB (ref 12.0–15.0)
MCH: 24.8 pg — ABNORMAL LOW (ref 26.0–34.0)
MCHC: 30.7 g/dL (ref 30.0–36.0)
MCV: 80.8 fL (ref 78.0–100.0)
PLATELETS: 373 10*3/uL (ref 150–400)
RBC: 4.79 MIL/uL (ref 3.87–5.11)
RDW: 14.5 % (ref 11.5–15.5)
WBC: 5.4 10*3/uL (ref 4.0–10.5)

## 2016-03-30 MED ORDER — FLUOXETINE HCL 20 MG PO CAPS
40.0000 mg | ORAL_CAPSULE | Freq: Every day | ORAL | Status: DC
  Administered 2016-03-31: 40 mg via ORAL
  Filled 2016-03-30 (×2): qty 2

## 2016-03-30 MED ORDER — LEVONORGESTREL-ETHINYL ESTRAD 0.1-20 MG-MCG PO TABS: 1.0000 | ORAL_TABLET | Freq: Every day | ORAL | Status: DC

## 2016-03-30 MED ORDER — QUETIAPINE FUMARATE 50 MG PO TABS
50.0000 mg | ORAL_TABLET | Freq: Every day | ORAL | Status: DC
  Administered 2016-03-30: 50 mg via ORAL
  Filled 2016-03-30: qty 1

## 2016-03-30 MED ORDER — HYDROXYZINE HCL 25 MG PO TABS: 50.0000 mg | ORAL_TABLET | Freq: Two times a day (BID) | ORAL | Status: DC | PRN

## 2016-03-30 NOTE — ED Notes (Signed)
Patient aware that urine sample is needed.

## 2016-03-30 NOTE — ED Notes (Signed)
Patient aware that another urine sample is needed.  First sample was insufficient amount.

## 2016-03-30 NOTE — ED Triage Notes (Signed)
Patient from home with suicidal thoughts  to jump off of a bridge on Thursday and Friday.  Today she is not having those thoughts but wants to be evaluated for those previously.  Patient has a history of major depressive disorder and anxiety.  She is a Ship broker at [employer].  Patient is calm and cooperative.  She reports she is compliant with her medications.  Patient sees a psychiatrist employed by the college.

## 2016-03-30 NOTE — ED Provider Notes (Addendum)
[facility] DEPT Provider Note   CSN: [id number] Arrival date & time: 03/30/16  1409     History   Chief Complaint Chief Complaint  Patient presents with  . Suicidal    HPI Shannon [name] is a 26 y.o. female.  Patient is a 26 year old female with a history of depression who presents with suicidal ideations. She states that 2 days ago she started having thoughts of wanting to kill herself. She had thoughts of running to jump off a bridge. These thoughts continued yesterday. Today she no longer feels that she wants to kill herself although she still has feelings of depression and wanted to be evaluated. She denies any physical complaints. She has a Social worker in [place] and sees a Teacher, music at [employer].      Past Medical History:  Diagnosis Date  . Depression   . Medical history non-contributory     Patient Active Problem List   Diagnosis Date Noted  . Major depressive disorder, recurrent severe without psychotic features ([place]) 01/10/2016  . Major depressive disorder, recurrent episode, mild ([place]) 01/01/2016  . Suicide ideation 02/16/2013    Past Surgical History:  Procedure Laterality Date  . NO PAST SURGERIES      OB History    No data available       Home Medications    Prior to Admission medications   Medication Sig Start Date End Date Taking? Authorizing Provider  FLUoxetine (PROZAC) 40 MG capsule Take 1 capsule (40 mg total) by mouth daily. 01/14/16  Yes Derrill Center, NP  hydrOXYzine (ATARAX/VISTARIL) 50 MG tablet Take 1 tablet (50 mg total) by mouth 3 times/day as needed-between meals & bedtime for anxiety (sleep). 01/13/16  Yes Derrill Center, NP  levonorgestrel-ethinyl estradiol (AVIANE,ALESSE,LESSINA) 0.1-20 MG-MCG tablet Take 1 tablet by mouth every morning.   Yes Historical Provider, MD  Multiple Vitamin (MULTIVITAMIN WITH MINERALS) TABS tablet Take 1 tablet by mouth daily.   Yes Historical Provider, MD  QUEtiapine (SEROQUEL) 50 MG tablet Take 1  tablet (50 mg total) by mouth at bedtime. 01/13/16  Yes Derrill Center, NP    Family History No family history on file.  Social History Social History  Substance Use Topics  . Smoking status: Never Smoker  . Smokeless tobacco: Never Used  . Alcohol use No     Allergies   Bupropion   Review of Systems Review of Systems  Constitutional: Negative for chills, diaphoresis, fatigue and fever.  HENT: Negative for congestion, rhinorrhea and sneezing.   Eyes: Negative.   Respiratory: Negative for cough, chest tightness and shortness of breath.   Cardiovascular: Negative for chest pain and leg swelling.  Gastrointestinal: Negative for abdominal pain, blood in stool, diarrhea, nausea and vomiting.  Genitourinary: Negative for difficulty urinating, flank pain, frequency and hematuria.  Musculoskeletal: Negative for arthralgias and back pain.  Skin: Negative for rash.  Neurological: Negative for dizziness, speech difficulty, weakness, numbness and headaches.  Psychiatric/Behavioral: Positive for dysphoric mood and suicidal ideas.     Physical Exam Updated Vital Signs BP 137/83 (BP Location: Right Arm)   Pulse 94   Temp 97.8 F (36.6 C) (Oral)   Resp 20   Ht 5\' 9"  (1.753 m)   Wt 280 lb (127 kg)   LMP 03/12/2016 (Exact Date)   SpO2 98%   BMI 41.35 kg/m   Physical Exam  Constitutional: She is oriented to person, place, and time. She appears well-developed and well-nourished.  HENT:  Head: Normocephalic and  atraumatic.  Eyes: Pupils are equal, round, and reactive to light.  Neck: Normal range of motion. Neck supple.  Cardiovascular: Normal rate, regular rhythm and normal heart sounds.   Pulmonary/Chest: Effort normal and breath sounds normal. No respiratory distress. She has no wheezes. She has no rales. She exhibits no tenderness.  Abdominal: Soft. Bowel sounds are normal. There is no tenderness. There is no rebound and no guarding.  Musculoskeletal: Normal range of motion.  She exhibits no edema.  Lymphadenopathy:    She has no cervical adenopathy.  Neurological: She is alert and oriented to person, place, and time.  Skin: Skin is warm and dry. No rash noted.  Psychiatric: She has a normal mood and affect.     ED Treatments / Results  Labs (all labs ordered are listed, but only abnormal results are displayed) Labs Reviewed  COMPREHENSIVE METABOLIC PANEL - Abnormal; Notable for the following:       Result Value   Glucose, Bld 105 (*)    Total Protein 8.5 (*)    ALT 13 (*)    All other components within normal limits  ACETAMINOPHEN LEVEL - Abnormal; Notable for the following:    Acetaminophen (Tylenol), Serum <10 (*)    All other components within normal limits  CBC - Abnormal; Notable for the following:    Hemoglobin 11.9 (*)    MCH 24.8 (*)    All other components within normal limits  ETHANOL  SALICYLATE LEVEL  PREGNANCY, URINE  RAPID URINE DRUG SCREEN, HOSP PERFORMED    EKG  EKG Interpretation None       Radiology No results found.  Procedures Procedures (including critical care time)  Medications Ordered in ED Medications  FLUoxetine (PROZAC) capsule 40 mg (not administered)  hydrOXYzine (ATARAX/VISTARIL) tablet 50 mg (not administered)  levonorgestrel-ethinyl estradiol (AVIANE,ALESSE,LESSINA) 0.1-20 MG-MCG per tablet 1 tablet (not administered)  QUEtiapine (SEROQUEL) tablet 50 mg (not administered)     Initial Impression / Assessment and Plan / ED Course  I have reviewed the triage vital signs and the nursing notes.  Pertinent labs & imaging results that were available during my care of the patient were reviewed by me and considered in my medical decision making (see chart for details).  Clinical Course     Patient with a history of depression presents with worsening depression and thoughts of suicide for the last 2 days although today she denies any thoughts of wanting to hurt herself. She's been evaluated by TTS who  recommends patient stay overnight and be evaluated by psychiatry in the a.m. Patient is amenable to this.She is medically clear.  Final Clinical Impressions(s) / ED Diagnoses   Final diagnoses:  Depression, unspecified depression type    New Prescriptions New Prescriptions   No medications on file     Malvin Johns, MD 03/30/16 [place], MD 03/30/16 1718

## 2016-03-30 NOTE — BH Assessment (Addendum)
Assessment Note  Shannon [name] is an 26 y.o. female presenting voluntarily to [facility] for a psychiatric evaluation. Patient states that earlier this week she felt overwhelmed "with everything and then my friend dumped all of her problems on me and I just told her that I wanted to jump off of a bridge." Patient states that after she said that she wanted to jump off of a bridge she went to spend time with her aunt in [place]. Patient denies current suicidal ideations with no plan or intent. Patient states that she has thought about jumping off of a bridge three times in the past with the last time being in 12/2015 when she was hospitalized. Patient states that those thoughts are triggered by stress in her friendships. Patient states that she has never went to a bridge or made a gesture to jump off of a bridge but she has thought about it. Patient denies self injurious behaviors. Patient denies previous suicide attempts but states that she has thought about suicide "about five times" in the past six months. Patient denies homicidal ideations. Patient denies access to firearms. Patient denies history of aggression. Patient denies pending charges or upcoming court dates. Patient denies active probation. Patient denies use of drugs and alcohol. Patient UDS pending and BAL <5 at time of assessment. Patient denies auditory and visual hallucinations and does not appear to be responding to internal stimuli.   Patient is alert and oriented x4. Patient is calm and cooperative during the assessment. Patient states that she sees Chartered certified accountant at [facility] for medication management and is prescribed Fluoxetine, Seroquel, and Hydroxyzine (as needed.) Patient states that she takes her medications as prescribed and has an appointment scheduled for Monday 04/01/2016. Patient states that she has therapy weekly at [facility] and has had medication management and therapy for approximately 1.5 years. Patient states that she has had  her current therapist at [facility] for about one year. Patient states that her next therapy appointment is Wednesday 04/03/2016. Patient states that she has had three previous psychiatric hospitalizations with the last being in October of 2017. Patient states that she has a history of depression but doesn't "really" feel depressed at this time. Patient endorses symptoms of depression as "trying" to isolate herself. Patient denies other symptoms of depression at this time.   Patient states that her aunt is very supportive and going to see her aunt is a coping skill learned in therapy. Patient denies history of trauma/abuse. 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   Consulted with Earleen Newport, NP who recommends overnight observation and evaluation by psychiatry tomorrow.    Diagnosis:  Major depressive disorder, recurrent severe without psychotic features   Past Medical History:  Past Medical History:  Diagnosis Date  . Depression   . Medical history non-contributory     Past Surgical History:  Procedure Laterality Date  . NO PAST SURGERIES      Family History: No family history on file.  Social History:  reports that she has never smoked. She has never used smokeless tobacco. She reports that she does not drink alcohol or use drugs.  Additional Social History:  Alcohol / Drug Use Pain Medications: Denies Prescriptions: Denies Over the Counter: Denies History of alcohol / drug use?: No history of alcohol / drug abuse  CIWA: CIWA-Ar BP: 137/83 Pulse Rate: 94 COWS:    Allergies:  Allergies  Allergen Reactions  . Bupropion Other (  See Comments)    Constipation     Home Medications:  (Not in a hospital admission)  OB/GYN Status:  Patient's last menstrual period was 03/12/2016 (exact date).  General Assessment Data Location of Assessment: [facility] TTS Assessment: In system Is this a Tele or Face-to-Face  Assessment?: Face-to-Face Is this an Initial Assessment or a Re-assessment for this encounter?: Initial Assessment Marital status: Single Is patient pregnant?: No Pregnancy Status: No Living Arrangements: Other (Comment) (roommates) Can pt return to current living arrangement?: Yes Admission Status: Voluntary Is patient capable of signing voluntary admission?: Yes Referral Source: Self/Family/Friend     Crisis Care Plan Living Arrangements: Other (Comment) (roommates) Name of Psychiatrist: Meghan Blankmann (1.5 years Fluoxetine 40mg , Seroquel 50, Hydroxizine 10) Name of Therapist: Suzanne Boron ([facility] almost one year)  Education Status Is patient currently in school?: Yes Current Grade: Product/process development scientist) Name of school: [employer] (plans for graduate school upon graduate)  Risk to self with the past 6 months Suicidal Ideation: No Has patient been a risk to self within the past 6 months prior to admission? : Yes Suicidal Intent: No Has patient had any suicidal intent within the past 6 months prior to admission? : Other (comment) Is patient at risk for suicide?: No Suicidal Plan?: No Has patient had any suicidal plan within the past 6 months prior to admission? : Yes (Thursday, jump off a bridge - told a friend and went to moms) Access to Means: No What has been your use of drugs/alcohol within the last 12 months?: Denies Previous Attempts/Gestures: Yes How many times?: 0 Other Self Harm Risks: Denies Triggers for Past Attempts: None known Intentional Self Injurious Behavior: None Family Suicide History: No Recent stressful life event(s): Conflict (Comment) (with friends) Persecutory voices/beliefs?: No Depression: No ("not right now") Depression Symptoms: Isolating Substance abuse history and/or treatment for substance abuse?: No Suicide prevention information given to non-admitted patients: Not applicable  Risk to Others within the past 6  months Homicidal Ideation: No Does patient have any lifetime risk of violence toward others beyond the six months prior to admission? : No Thoughts of Harm to Others: No Current Homicidal Intent: No Current Homicidal Plan: No Access to Homicidal Means: No Identified Victim: Denies History of harm to others?: No Assessment of Violence: None Noted Violent Behavior Description: Denies Does patient have access to weapons?: No Criminal Charges Pending?: No Does patient have a court date: No Is patient on probation?: No  Psychosis Hallucinations: None noted Delusions: None noted  Mental Status Report Appearance/Hygiene: In scrubs Eye Contact: Good Motor Activity: Unable to assess Speech: Logical/coherent Level of Consciousness: Alert Mood: Pleasant Affect: Appropriate to circumstance Anxiety Level: None Thought Processes: Coherent, Relevant Orientation: Person, Place, Time, Situation, Appropriate for developmental age Obsessive Compulsive Thoughts/Behaviors: None  Cognitive Functioning Concentration: Good Memory: Recent Intact, Remote Intact Appetite: Good Sleep: No Change Total Hours of Sleep: 7 Vegetative Symptoms: None  ADLScreening [facility] Assessment Services) Patient's cognitive ability adequate to safely complete daily activities?: Yes Patient able to express need for assistance with ADLs?: Yes Independently performs ADLs?: Yes (appropriate for developmental age)  Prior Inpatient Therapy Prior Inpatient Therapy: Yes Prior Therapy Dates: 2014, 2016, 2017 Prior Therapy Facilty/Provider(s): [facility], [facility] Reason for Treatment: Depression  Prior Outpatient Therapy Prior Outpatient Therapy: Yes Prior Therapy Dates: Present Prior Therapy Facilty/Provider(s): [facility] and [facility] Reason for Treatment: Depression (medication management and weekly therapy) Does patient have an ACCT team?: No Does patient have Intensive In-House Services?  :  No Does patient have  [facility] services? : No Does patient have P4CC services?: No  ADL Screening (condition at time of admission) Patient's cognitive ability adequate to safely complete daily activities?: Yes Is the patient deaf or have difficulty hearing?: No Does the patient have difficulty seeing, even when wearing glasses/contacts?: No Does the patient have difficulty concentrating, remembering, or making decisions?: No Patient able to express need for assistance with ADLs?: Yes Does the patient have difficulty dressing or bathing?: No Independently performs ADLs?: Yes (appropriate for developmental age) Does the patient have difficulty walking or climbing stairs?: No Weakness of Legs: None Weakness of Arms/Hands: None  Home Assistive Devices/Equipment Home Assistive Devices/Equipment: None  Therapy Consults (therapy consults require a physician order) PT Evaluation Needed: No OT Evalulation Needed: No SLP Evaluation Needed: No Abuse/Neglect Assessment (Assessment to be complete while patient is alone) Physical Abuse: Denies Verbal Abuse: Denies Sexual Abuse: Denies Exploitation of patient/patient's resources: Denies Self-Neglect: Denies Values / Beliefs Cultural Requests During Hospitalization: None Spiritual Requests During Hospitalization: None   Advance Directives (For Healthcare) Does Patient Have a Medical Advance Directive?: No Would patient like information on creating a medical advance directive?: No - Patient declined    Additional Information 1:1 In Past 12 Months?: No CIRT Risk: No Elopement Risk: No Does patient have medical clearance?: No     Disposition:  Disposition Initial Assessment Completed for this Encounter: Yes Disposition of Patient: Other dispositions (overnight observation per Earleen Newport, NP ) Other disposition(s): Other (Comment)  On Site Evaluation by:   Reviewed with Physician:    Lorenna Lurry 03/30/2016 5:58 PM

## 2016-03-31 ENCOUNTER — Encounter (HOSPITAL_COMMUNITY): Payer: Self-pay | Admitting: Registered Nurse

## 2016-03-31 DIAGNOSIS — F4329 Adjustment disorder with other symptoms: Secondary | ICD-10-CM | POA: Diagnosis not present

## 2016-03-31 DIAGNOSIS — F332 Major depressive disorder, recurrent severe without psychotic features: Secondary | ICD-10-CM

## 2016-03-31 DIAGNOSIS — Z79899 Other long term (current) drug therapy: Secondary | ICD-10-CM

## 2016-03-31 LAB — RAPID URINE DRUG SCREEN, HOSP PERFORMED
Amphetamines: NOT DETECTED
Barbiturates: NOT DETECTED
Benzodiazepines: NOT DETECTED
Cocaine: NOT DETECTED
OPIATES: NOT DETECTED
TETRAHYDROCANNABINOL: NOT DETECTED

## 2016-03-31 NOTE — ED Notes (Signed)
ON PHONE

## 2016-03-31 NOTE — BHH Suicide Risk Assessment (Cosign Needed)
Suicide Risk Assessment  Discharge Assessment   [facility] Discharge Suicide Risk Assessment   Principal Problem: Adjustment disorder with emotional disturbance Discharge Diagnoses:  Patient Active Problem List   Diagnosis Date Noted  . Adjustment disorder with emotional disturbance [F43.29] 03/31/2016  . Major depressive disorder, recurrent severe without psychotic features ([place]) [F33.2] 01/10/2016  . Major depressive disorder, recurrent episode, mild ([place]) [F33.0] 01/01/2016  . Suicide ideation [R45.851] 02/16/2013    Total Time spent with patient: 30 minutes  Musculoskeletal: Strength & Muscle Tone: within normal limits Gait & Station: normal Patient leans: N/A  Psychiatric Specialty Exam: Physical Exam  Nursing note and vitals reviewed. Constitutional: She is oriented to person, place, and time.  Neck: Normal range of motion.  Respiratory: Effort normal.  Musculoskeletal: Normal range of motion.  Neurological: She is alert and oriented to person, place, and time.  Psychiatric: Her speech is normal and behavior is normal. Thought content is not paranoid and not delusional. Cognition and memory are normal. She expresses impulsivity. She exhibits a depressed mood. She expresses no homicidal and no suicidal ideation.    Review of Systems  Constitutional: Negative.   HENT: Negative.   Eyes: Negative.   Respiratory: Negative.   Cardiovascular: Negative.   Gastrointestinal: Negative.   Genitourinary: Negative.   Musculoskeletal: Negative.   Skin: Negative.   Neurological: Negative.   Endo/Heme/Allergies: Negative.   Psychiatric/Behavioral: Positive for depression. Negative for hallucinations, memory loss, substance abuse and suicidal ideas. The patient is not nervous/anxious and does not have insomnia.     Blood pressure 126/84, pulse 85, temperature 98 F (36.7 C), temperature source Oral, resp. rate 20, height 5\' 9"  (1.753 m), weight 127 kg (280 lb), last menstrual period  03/12/2016, SpO2 98 %.Body mass index is 41.35 kg/m.  General Appearance: Casual  Eye Contact:  Good  Speech:  Clear and Coherent and Normal Rate  Volume:  Normal  Mood:  Depressed  Affect:  Congruent  Thought Process:  Coherent and Goal Directed  Orientation:  Full (Time, Place, and Person)  Thought Content:  Logical  Suicidal Thoughts:  No  Homicidal Thoughts:  No  Memory:  Immediate;   Good Recent;   Good Remote;   Good  Judgement:  Intact  Insight:  Present  Psychomotor Activity:  Normal  Concentration:  Concentration: Good and Attention Span: Good  Recall:  Good  Fund of Knowledge:  Good  Language:  Good  Akathisia:  No  Handed:  Right  AIMS (if indicated):     Assets:  Communication Skills Desire for Improvement Housing Social Support Vocational/Educational  ADL's:  Intact  Cognition:  WNL  Sleep:        Mental Status Per Nursing Assessment::   On Admission:     Demographic Factors:  Black female  Loss Factors: None  Historical Factors: None  Risk Reduction Factors:   Positive social support and Positive therapeutic relationship  Continued Clinical Symptoms:  Previous Psychiatric Diagnoses and Treatments  Cognitive Features That Contribute To Risk:  None    Suicide Risk:  Minimal: No identifiable suicidal ideation.  Patients presenting with no risk factors but with morbid ruminations; may be classified as minimal risk based on the severity of the depressive symptoms    Plan Of Care/Follow-up recommendations:  Activity:  As tolerated Diet:  As tolerated Other:  Keep scheduled appointment with Lenor Coffin NP  Earleen Newport, NP 03/31/2016, 3:44 PM

## 2016-03-31 NOTE — BH Assessment (Signed)
Patient does not meet criteria for inpatient hospitalization per Dr. Darleene Cleaver. Patient will be referred to [facility] Partial Hospitalization program. Patient was given a brochure for PHP and denies any questions or concerns. She is in agreement with the referral and provided her telephone number as [phone] to call to schedule an intake.    Rosalin Hawking, LCSW Therapeutic Triage Specialist [place] 03/31/2016 11:41 AM

## 2016-03-31 NOTE — Consult Note (Signed)
[facility] Consult   Reason for Consult:  Worsening depression Referring Physician:  EDP Patient Identification: Shannon [name] MRN:  [id number] Principal Diagnosis: Adjustment disorder with emotional disturbance Diagnosis:   Patient Active Problem List   Diagnosis Date Noted  . Adjustment disorder with emotional disturbance [F43.29] 03/31/2016  . Major depressive disorder, recurrent severe without psychotic features ([place]) [F33.2] 01/10/2016  . Major depressive disorder, recurrent episode, mild ([place]) [F33.0] 01/01/2016  . Suicide ideation [R45.851] 02/16/2013    Total Time spent with patient: 45 minutes  Subjective:   Shannon [name] is a 26 y.o. female patient admitted related to worsening depression.  HPI:  Shannon [name] 26 y.o. female patient seen by Dr. Darleene Cleaver and this provider.  Chart reviewed 03/31/16.   On evaluation:  Shannon [name] reports that she was feeling stressed and overwhelmed listening to her friends problems.  States that she has outpatient services with Lenor Coffin, NP tomorrow at 9:20 am.  At this time patient denies suicidal/homicidal ideation, psychosis, and paranoia.  Reports that she just wanted an evaluation.     Past Psychiatric History: Major Depressive Disorder; Outpatient service [facility] Lenor Coffin NP  Risk to Self: Suicidal Ideation: No Suicidal Intent: No Is patient at risk for suicide?: No Suicidal Plan?: No Access to Means: No What has been your use of drugs/alcohol within the last 12 months?: Denies How many times?: 0 Other Self Harm Risks: Denies Triggers for Past Attempts: None known Intentional Self Injurious Behavior: None Risk to Others: Homicidal Ideation: No Thoughts of Harm to Others: No Current Homicidal Intent: No Current Homicidal Plan: No Access to Homicidal Means: No Identified Victim: Denies History of harm to others?: No Assessment of Violence: None Noted Violent Behavior Description: Denies Does patient  have access to weapons?: No Criminal Charges Pending?: No Does patient have a court date: No Prior Inpatient Therapy: Prior Inpatient Therapy: Yes Prior Therapy Dates: 2014, 2016, 2017 Prior Therapy Facilty/Provider(s): [facility], [facility] Reason for Treatment: Depression Prior Outpatient Therapy: Prior Outpatient Therapy: Yes Prior Therapy Dates: Present Prior Therapy Facilty/Provider(s): [facility] and [facility] Reason for Treatment: Depression (medication management and weekly therapy) Does patient have an ACCT team?: No Does patient have Intensive In-House Services?  : No Does patient have [facility] services? : No Does patient have P4CC services?: No  Past Medical History:  Past Medical History:  Diagnosis Date  . Depression   . Medical history non-contributory     Past Surgical History:  Procedure Laterality Date  . NO PAST SURGERIES     Family History: History reviewed. No pertinent family history. Family Psychiatric  History: Denies Social History:  History  Alcohol Use No     History  Drug Use No    Social History   Social History  . Marital status: Single    Spouse name: N/A  . Number of children: N/A  . Years of education: N/A   Social History Main Topics  . Smoking status: Never Smoker  . Smokeless tobacco: Never Used  . Alcohol use No  . Drug use: No  . Sexual activity: No   Other Topics Concern  . None   Social History Narrative  . None   Additional Social History:    Allergies:   Allergies  Allergen Reactions  . Bupropion Other (See Comments)    Constipation     Labs:  Results for orders placed or performed during the hospital encounter of 03/30/16 (from the past 48 hour(s))  Rapid urine drug screen (  hospital performed)     Status: None   Collection Time: 03/30/16  2:02 AM  Result Value Ref Range   Opiates NONE DETECTED NONE DETECTED   Cocaine NONE DETECTED NONE DETECTED   Benzodiazepines NONE DETECTED NONE DETECTED   Amphetamines  NONE DETECTED NONE DETECTED   Tetrahydrocannabinol NONE DETECTED NONE DETECTED   Barbiturates NONE DETECTED NONE DETECTED    Comment:        DRUG SCREEN FOR MEDICAL PURPOSES ONLY.  IF CONFIRMATION IS NEEDED FOR ANY PURPOSE, NOTIFY LAB WITHIN 5 DAYS.        LOWEST DETECTABLE LIMITS FOR URINE DRUG SCREEN Drug Class       Cutoff (ng/mL) Amphetamine      1000 Barbiturate      200 Benzodiazepine   027 Tricyclics       253 Opiates          300 Cocaine          300 THC              50   Comprehensive metabolic panel     Status: Abnormal   Collection Time: 03/30/16  2:57 PM  Result Value Ref Range   Sodium 139 135 - 145 mmol/L   Potassium 3.6 3.5 - 5.1 mmol/L   Chloride 107 101 - 111 mmol/L   CO2 23 22 - 32 mmol/L   Glucose, Bld 105 (H) 65 - 99 mg/dL   BUN 6 6 - 20 mg/dL   Creatinine, Ser 0.87 0.44 - 1.00 mg/dL   Calcium 8.9 8.9 - 10.3 mg/dL   Total Protein 8.5 (H) 6.5 - 8.1 g/dL   Albumin 4.2 3.5 - 5.0 g/dL   AST 21 15 - 41 U/L   ALT 13 (L) 14 - 54 U/L   Alkaline Phosphatase 90 38 - 126 U/L   Total Bilirubin 1.0 0.3 - 1.2 mg/dL   GFR calc non Af Amer >60 >60 mL/min   GFR calc Af Amer >60 >60 mL/min    Comment: (NOTE) The eGFR has been calculated using the CKD EPI equation. This calculation has not been validated in all clinical situations. eGFR's persistently <60 mL/min signify possible Chronic Kidney Disease.    Anion gap 9 5 - 15  Ethanol     Status: None   Collection Time: 03/30/16  2:57 PM  Result Value Ref Range   Alcohol, Ethyl (B) <5 <5 mg/dL    Comment:        LOWEST DETECTABLE LIMIT FOR SERUM ALCOHOL IS 5 mg/dL FOR MEDICAL PURPOSES ONLY   Salicylate level     Status: None   Collection Time: 03/30/16  2:57 PM  Result Value Ref Range   Salicylate Lvl <6.6 2.8 - 30.0 mg/dL  Acetaminophen level     Status: Abnormal   Collection Time: 03/30/16  2:57 PM  Result Value Ref Range   Acetaminophen (Tylenol), Serum <10 (L) 10 - 30 ug/mL    Comment:         THERAPEUTIC CONCENTRATIONS VARY SIGNIFICANTLY. A RANGE OF 10-30 ug/mL MAY BE AN EFFECTIVE CONCENTRATION FOR MANY PATIENTS. HOWEVER, SOME ARE BEST TREATED AT CONCENTRATIONS OUTSIDE THIS RANGE. ACETAMINOPHEN CONCENTRATIONS >150 ug/mL AT 4 HOURS AFTER INGESTION AND >50 ug/mL AT 12 HOURS AFTER INGESTION ARE OFTEN ASSOCIATED WITH TOXIC REACTIONS.   cbc     Status: Abnormal   Collection Time: 03/30/16  2:57 PM  Result Value Ref Range   WBC 5.4 4.0 - 10.5 K/uL   RBC  4.79 3.87 - 5.11 MIL/uL   Hemoglobin 11.9 (L) 12.0 - 15.0 g/dL   HCT 38.7 36.0 - 46.0 %   MCV 80.8 78.0 - 100.0 fL   MCH 24.8 (L) 26.0 - 34.0 pg   MCHC 30.7 30.0 - 36.0 g/dL   RDW 14.5 11.5 - 15.5 %   Platelets 373 150 - 400 K/uL  Pregnancy, urine     Status: None   Collection Time: 03/30/16  3:38 PM  Result Value Ref Range   Preg Test, Ur NEGATIVE NEGATIVE    Comment:        THE SENSITIVITY OF THIS METHODOLOGY IS >20 mIU/mL.     Current Facility-Administered Medications  Medication Dose Route Frequency Provider Last Rate Last Dose  . FLUoxetine (PROZAC) capsule 40 mg  40 mg Oral Daily Malvin Johns, MD   40 mg at 03/31/16 1006  . hydrOXYzine (ATARAX/VISTARIL) tablet 50 mg  50 mg Oral BID BM & HS PRN Malvin Johns, MD      . levonorgestrel-ethinyl estradiol (AVIANE,ALESSE,LESSINA) 0.1-20 MG-MCG per tablet 1 tablet  1 tablet Oral Daily Malvin Johns, MD      . QUEtiapine (SEROQUEL) tablet 50 mg  50 mg Oral QHS Malvin Johns, MD   50 mg at 03/30/16 2112   Current Outpatient Prescriptions  Medication Sig Dispense Refill  . FLUoxetine (PROZAC) 40 MG capsule Take 1 capsule (40 mg total) by mouth daily. 30 capsule 0  . hydrOXYzine (ATARAX/VISTARIL) 50 MG tablet Take 1 tablet (50 mg total) by mouth 3 times/day as needed-between meals & bedtime for anxiety (sleep). 30 tablet 0  . levonorgestrel-ethinyl estradiol (AVIANE,ALESSE,LESSINA) 0.1-20 MG-MCG tablet Take 1 tablet by mouth every morning.    . Multiple Vitamin  (MULTIVITAMIN WITH MINERALS) TABS tablet Take 1 tablet by mouth daily.    . QUEtiapine (SEROQUEL) 50 MG tablet Take 1 tablet (50 mg total) by mouth at bedtime. 30 tablet 0    Musculoskeletal: Strength & Muscle Tone: within normal limits Gait & Station: normal Patient leans: N/A  Psychiatric Specialty Exam: Physical Exam  Nursing note and vitals reviewed. Constitutional: She is oriented to person, place, and time.  Neck: Normal range of motion.  Respiratory: Effort normal.  Musculoskeletal: Normal range of motion.  Neurological: She is alert and oriented to person, place, and time.  Psychiatric: Her speech is normal and behavior is normal. Thought content is not paranoid and not delusional. Cognition and memory are normal. She expresses impulsivity. She exhibits a depressed mood. She expresses no homicidal and no suicidal ideation.    Review of Systems  Constitutional: Negative.   HENT: Negative.   Eyes: Negative.   Respiratory: Negative.   Cardiovascular: Negative.   Gastrointestinal: Negative.   Genitourinary: Negative.   Musculoskeletal: Negative.   Skin: Negative.   Neurological: Negative.   Endo/Heme/Allergies: Negative.   Psychiatric/Behavioral: Positive for depression. Negative for hallucinations, memory loss, substance abuse and suicidal ideas. The patient is not nervous/anxious and does not have insomnia.     Blood pressure 126/84, pulse 85, temperature 98 F (36.7 C), temperature source Oral, resp. rate 20, height 5' 9" (1.753 m), weight 127 kg (280 lb), last menstrual period 03/12/2016, SpO2 98 %.Body mass index is 41.35 kg/m.  General Appearance: Casual  Eye Contact:  Good  Speech:  Clear and Coherent and Normal Rate  Volume:  Normal  Mood:  Depressed  Affect:  Congruent  Thought Process:  Coherent and Goal Directed  Orientation:  Full (Time, Place, and Person)  Thought Content:  Logical  Suicidal Thoughts:  No  Homicidal Thoughts:  No  Memory:  Immediate;    Good Recent;   Good Remote;   Good  Judgement:  Intact  Insight:  Present  Psychomotor Activity:  Normal  Concentration:  Concentration: Good and Attention Span: Good  Recall:  Good  Fund of Knowledge:  Good  Language:  Good  Akathisia:  No  Handed:  Right  AIMS (if indicated):     Assets:  Communication Skills Desire for Improvement Housing Social Support Vocational/Educational  ADL's:  Intact  Cognition:  WNL  Sleep:        Treatment Plan Summary: Plan Discharge home  Disposition: No evidence of imminent risk to self or others at present.   Patient does not meet criteria for psychiatric inpatient admission. Discharge home; keep scheduled appointment with Lenor Coffin NP  Earleen Newport, NP 03/31/2016 3:33 PM  Patient seen face-to-face for psychiatric evaluation, chart reviewed and case discussed with the physician extender and developed treatment plan. Reviewed the information documented and agree with the treatment plan. Corena Pilgrim, MD

## 2016-03-31 NOTE — ED Notes (Signed)
TTS AT BEDSIDE 

## 2016-03-31 NOTE — ED Notes (Signed)
PT STATES SHE DOES NOT HAVE HER BIRTH CONTROL PILLS. SHE HAS NO ONE TO BRING THEM AND PHARMACY DOES NOT SUPPLY. PT UNDERSTANDS WITH MISSING DOSES SHE WILL NEED TO ADD ADDITIONAL PROTECTION AS HER CURRENT BIRTH CONTROL HAS BEEN INTERRUPTED.

## 2016-04-01 ENCOUNTER — Telehealth (HOSPITAL_COMMUNITY): Payer: Self-pay | Admitting: Licensed Clinical Social Worker

## 2016-07-04 ENCOUNTER — Encounter (HOSPITAL_COMMUNITY): Payer: Self-pay | Admitting: Emergency Medicine

## 2016-07-04 ENCOUNTER — Inpatient Hospital Stay (HOSPITAL_COMMUNITY)
Admission: EM | Admit: 2016-07-04 | Discharge: 2016-07-06 | DRG: 175 | Disposition: A | Discharge status: Home / Self Care | Payer: BLUE CROSS/BLUE SHIELD | Attending: Internal Medicine | Admitting: Internal Medicine

## 2016-07-04 ENCOUNTER — Emergency Department (HOSPITAL_COMMUNITY): Payer: BLUE CROSS/BLUE SHIELD

## 2016-07-04 DIAGNOSIS — I2609 Other pulmonary embolism with acute cor pulmonale: Secondary | ICD-10-CM | POA: Diagnosis present

## 2016-07-04 DIAGNOSIS — I82412 Acute embolism and thrombosis of left femoral vein: Secondary | ICD-10-CM | POA: Diagnosis not present

## 2016-07-04 DIAGNOSIS — R9431 Abnormal electrocardiogram [ECG] [EKG]: Secondary | ICD-10-CM | POA: Diagnosis not present

## 2016-07-04 DIAGNOSIS — F418 Other specified anxiety disorders: Secondary | ICD-10-CM | POA: Diagnosis present

## 2016-07-04 DIAGNOSIS — Z6841 Body Mass Index (BMI) 40.0 and over, adult: Secondary | ICD-10-CM

## 2016-07-04 DIAGNOSIS — E876 Hypokalemia: Secondary | ICD-10-CM | POA: Diagnosis present

## 2016-07-04 DIAGNOSIS — F329 Major depressive disorder, single episode, unspecified: Secondary | ICD-10-CM | POA: Diagnosis not present

## 2016-07-04 DIAGNOSIS — R0600 Dyspnea, unspecified: Secondary | ICD-10-CM | POA: Diagnosis not present

## 2016-07-04 DIAGNOSIS — Z79899 Other long term (current) drug therapy: Secondary | ICD-10-CM | POA: Diagnosis not present

## 2016-07-04 DIAGNOSIS — I4581 Long QT syndrome: Secondary | ICD-10-CM | POA: Diagnosis not present

## 2016-07-04 DIAGNOSIS — I2699 Other pulmonary embolism without acute cor pulmonale: Secondary | ICD-10-CM

## 2016-07-04 DIAGNOSIS — I82442 Acute embolism and thrombosis of left tibial vein: Secondary | ICD-10-CM | POA: Diagnosis not present

## 2016-07-04 DIAGNOSIS — I82432 Acute embolism and thrombosis of left popliteal vein: Secondary | ICD-10-CM | POA: Diagnosis present

## 2016-07-04 DIAGNOSIS — R0602 Shortness of breath: Secondary | ICD-10-CM | POA: Diagnosis not present

## 2016-07-04 LAB — BASIC METABOLIC PANEL
Anion gap: 9 (ref 5–15)
BUN: 10 mg/dL (ref 6–20)
CHLORIDE: 110 mmol/L (ref 101–111)
CO2: 21 mmol/L — AB (ref 22–32)
CREATININE: 1.11 mg/dL — AB (ref 0.44–1.00)
Calcium: 9.3 mg/dL (ref 8.9–10.3)
GFR calc Af Amer: >60 mL/min (ref >60)
GFR calc non Af Amer: >60 mL/min (ref >60)
GLUCOSE: 119 mg/dL — AB (ref 65–99)
POTASSIUM: 3.4 mmol/L — AB (ref 3.5–5.1)
SODIUM: 140 mmol/L (ref 135–145)

## 2016-07-04 LAB — URINALYSIS, ROUTINE W REFLEX MICROSCOPIC
BILIRUBIN URINE: NEGATIVE
Glucose, UA: NEGATIVE mg/dL
Ketones, ur: NEGATIVE mg/dL
Nitrite: NEGATIVE
PROTEIN: 100 mg/dL — AB
Specific Gravity, Urine: 1.029 (ref 1.005–1.030)
pH: 5 (ref 5.0–8.0)

## 2016-07-04 LAB — D-DIMER, QUANTITATIVE: D-Dimer, Quant: 8.43 ug/mL-FEU — ABNORMAL HIGH (ref 0.00–0.50)

## 2016-07-04 LAB — I-STAT TROPONIN, ED
Troponin i, poc: 0.05 ng/mL (ref 0.00–0.08)
Troponin i, poc: 0.05 ng/mL (ref 0.00–0.08)

## 2016-07-04 LAB — CBC
HEMATOCRIT: 40.4 % (ref 36.0–46.0)
Hemoglobin: 12.8 g/dL (ref 12.0–15.0)
MCH: 24.8 pg — AB (ref 26.0–34.0)
MCHC: 31.7 g/dL (ref 30.0–36.0)
MCV: 78.1 fL (ref 78.0–100.0)
PLATELETS: 300 10*3/uL (ref 150–400)
RBC: 5.17 MIL/uL — ABNORMAL HIGH (ref 3.87–5.11)
RDW: 14.4 % (ref 11.5–15.5)
WBC: 7.4 10*3/uL (ref 4.0–10.5)

## 2016-07-04 LAB — BRAIN NATRIURETIC PEPTIDE: B Natriuretic Peptide: 484.7 pg/mL — ABNORMAL HIGH (ref 0.0–100.0)

## 2016-07-04 LAB — PREGNANCY, URINE: PREG TEST UR: NEGATIVE

## 2016-07-04 MED ORDER — POTASSIUM CHLORIDE CRYS ER 20 MEQ PO TBCR
30.0000 meq | EXTENDED_RELEASE_TABLET | Freq: Two times a day (BID) | ORAL | Status: DC
  Administered 2016-07-04: 30 meq via ORAL
  Filled 2016-07-04: qty 1

## 2016-07-04 MED ORDER — POLYETHYLENE GLYCOL 3350 17 G PO PACK: 17.0000 g | PACK | Freq: Every day | ORAL | Status: DC | PRN

## 2016-07-04 MED ORDER — MAGNESIUM SULFATE IN D5W 1-5 GM/100ML-% IV SOLN
1.0000 g | Freq: Once | INTRAVENOUS | Status: AC
  Administered 2016-07-04: 1 g via INTRAVENOUS
  Filled 2016-07-04: qty 100

## 2016-07-04 MED ORDER — BISACODYL 5 MG PO TBEC: 5.0000 mg | DELAYED_RELEASE_TABLET | Freq: Every day | ORAL | Status: DC | PRN

## 2016-07-04 MED ORDER — SODIUM CHLORIDE 0.9% FLUSH: 3.0000 mL | Freq: Two times a day (BID) | INTRAVENOUS | Status: DC

## 2016-07-04 MED ORDER — HYDROCODONE-ACETAMINOPHEN 5-325 MG PO TABS: 1.0000 | ORAL_TABLET | ORAL | Status: DC | PRN

## 2016-07-04 MED ORDER — ACETAMINOPHEN 325 MG PO TABS
650.0000 mg | ORAL_TABLET | Freq: Four times a day (QID) | ORAL | Status: DC | PRN
Start: 2016-07-04 — End: 2016-07-06

## 2016-07-04 MED ORDER — ADULT MULTIVITAMIN W/MINERALS CH
1.0000 | ORAL_TABLET | Freq: Every day | ORAL | Status: DC
  Administered 2016-07-05 – 2016-07-06 (×2): 1 via ORAL
  Filled 2016-07-04 (×2): qty 1

## 2016-07-04 MED ORDER — IOPAMIDOL (ISOVUE-370) INJECTION 76%
100.0000 mL | Freq: Once | INTRAVENOUS | Status: AC | PRN
  Administered 2016-07-04: 100 mL via INTRAVENOUS

## 2016-07-04 MED ORDER — HEPARIN BOLUS VIA INFUSION
5000.0000 [IU] | Freq: Once | INTRAVENOUS | Status: AC
  Administered 2016-07-04: 5000 [IU] via INTRAVENOUS
  Filled 2016-07-04: qty 5000

## 2016-07-04 MED ORDER — POTASSIUM CHLORIDE IN NACL 20-0.9 MEQ/L-% IV SOLN
INTRAVENOUS | Status: AC
  Administered 2016-07-04: 22:00:00 via INTRAVENOUS
  Filled 2016-07-04: qty 1000

## 2016-07-04 MED ORDER — HEPARIN (PORCINE) IN NACL 100-0.45 UNIT/ML-% IJ SOLN
1600.0000 [IU]/h | INTRAMUSCULAR | Status: DC
  Administered 2016-07-04: 1700 [IU]/h via INTRAVENOUS
  Administered 2016-07-05: 1600 [IU]/h via INTRAVENOUS
  Filled 2016-07-04 (×2): qty 250

## 2016-07-04 MED ORDER — ACETAMINOPHEN 650 MG RE SUPP
650.0000 mg | Freq: Four times a day (QID) | RECTAL | Status: DC | PRN
Start: 2016-07-04 — End: 2016-07-06

## 2016-07-04 NOTE — Progress Notes (Signed)
ANTICOAGULATION CONSULT NOTE - Initial Consult  Pharmacy Consult for Heparin Indication: pulmonary embolus  Allergies  Allergen Reactions  . Bupropion Other (See Comments)    Constipation     Patient Measurements: Height: 5\' 9"  (175.3 cm) Weight: 286 lb (129.7 kg) IBW/kg (Calculated) : 66.2 Heparin Dosing Weight: 95 kg  Vital Signs: Temp: 97.9 F (36.6 C) (04/12 1605) Temp Source: Oral (04/12 1404) BP: 114/90 (04/12 1709) Pulse Rate: 100 (04/12 1709)  Labs:  Recent Labs  07/04/16 1404  HGB 12.8  HCT 40.4  PLT 300  CREATININE 1.11*    Estimated Creatinine Clearance: 111.1 mL/min (A) (by C-G formula based on SCr of 1.11 mg/dL (H)).   Medical History: Past Medical History:  Diagnosis Date  . Depression   . Medical history non-contributory     Medications:  Infusions:  PTA meds:  No anticoagulants PTA, Takes birth control pills  Assessment: 26 yo F referred by [facility] for shortness of breath, elevated DDimer.  CT chest + PE with right heart strain.   Heparin per RX ordered. CBC reviewed & WNL.   Goal of Therapy:  Heparin level 0.3-0.7 units/ml Monitor platelets by anticoagulation protocol: Yes   Plan:  Give 5000 units bolus x 1 Start heparin infusion at 1700 units/hr Check anti-Xa level in 6 hours and daily while on heparin Continue to monitor H&H and platelets  Embree Brawley, Lavonia Drafts 07/04/2016,5:56 PM

## 2016-07-04 NOTE — ED Notes (Signed)
Student health from [facility] [employer] called with a d-dimer from the school that resulted as 6.68

## 2016-07-04 NOTE — ED Notes (Signed)
Pyxis and pharmacy bin check Mag not in profile or pyxis med bin waiting to arrive from pharmacy

## 2016-07-04 NOTE — ED Notes (Signed)
Patient transported to CT 

## 2016-07-04 NOTE — Care Management Note (Signed)
Case Management Note  Patient Details  Name: Shannon [name] MRN: [id number] Date of Birth: 11-07-1990  Subjective/Objective:                  SOB, syncope   Action/Plan: ED CM spoke with the patient in the ED. She reports she lives with 2 roommates. She is a Electronics engineer. CM to continue to follow for discharge medication needs.   Expected Discharge Date:                  Expected Discharge Plan:  Home/Self Care  In-House Referral:     Discharge planning Services  CM Consult, Medication Assistance  Post Acute Care Choice:    Choice offered to:     DME Arranged:    DME Agency:     HH Arranged:    HH Agency:     Status of Service:  In process, will continue to follow  If discussed at Long Length of Stay Meetings, dates discussed:    Additional Comments:  Apolonio Schneiders, RN 07/04/2016, 7:57 PM

## 2016-07-04 NOTE — ED Triage Notes (Addendum)
Pt c/o shortness of breath upon exertion at work today. States she sat in her car and loss consciousness for a few minutes.  She was seen at the [facility] where EKG was completed. Refereed here to rule out PE vs Cardiac etiologies. Denies chest pain, speaking clear full sentences with NAD at triage.

## 2016-07-04 NOTE — H&P (Signed)
History and Physical    Samaya Boardley TKW:[id number] DOB: 07/27/1990 DOA: 07/04/2016  PCP: No PCP Per Patient   Patient coming from: Home, by way of [facility]   Chief Complaint: SOB, syncope   HPI: Alazne Quant is a 26 y.o. female with medical history significant for depression and anxiety, now presenting at the direction of her PCP for evaluation of progressive dyspnea, a syncopal episode, and elevated d-dimer at the outpatient clinic. Patient reports that she had been in her usual state of health until approximately 3 weeks ago when she noted the insidious development of exertional dyspnea. Her shortness breath has continued to worsen over that interval and she has also experienced occasional chest pain on inspiration. There has not been any significant cough, she denies lower extremity swelling or tenderness, denies any recent surgery or prolonged immobilization, and denies any personal or family history of VTE. She takes oral contraception but does not smoke. She works as a Education officer, community, but makes short deliveries for [employer] and has not been in any prolonged car or plane ride. Patient reports sitting in her car earlier today trying to catch her breath when she believes she lost consciousness for a minute or so. She denies any headache, change in vision or hearing, or loss of coordination. There is no focal numbness or weakness and she denies any fall or head strike. She denies any history of easy bleeding or bruising and has been treated with iron supplements for history of iron deficiency anemia. She was seen at the [facility] clinic today for these complaints and d-dimer was obtained, returning elevated to 6.68 after she had already been sent to ED.   ED Course: Upon arrival to the ED, patient is found to be afebrile, saturating well on room air, mildly tachypneic and mildly tachycardic stable blood pressure. EKG features a sinus tachycardia with rate 111, inferolateral T-wave inversions, and  QTc of 597 ms. Chemistry panel reveals a potassium of 3.4, bicarbonate of 21, and serum creatinine 1.11. CBC is unremarkable. D-dimer is elevated to a value of 8.43 and troponin is within the normal limits 2 three hours apart. Chest x-ray was negative for edema or consolidation and CTA chest reveals PE arising from the distal main pulmonary arteries and extending into multiple peripheral branches bilaterally with evidence for right heart strain (RV/LV ratio 1.1). Patient was started on heparin infusion and the case was discussed with [facility] who advised no further treatment at this time pending cardiac enzyme trend and echocardiogram results. Patient remained hemodynamically stable and in no acute respiratory distress. She'll be admitted to the telemetry unit for ongoing evaluation and management of acute bilateral pulmonary embolus with right heart strain.  Review of Systems:  All other systems reviewed and apart from HPI, are negative.  Past Medical History:  Diagnosis Date  . Depression   . Medical history non-contributory     Past Surgical History:  Procedure Laterality Date  . NO PAST SURGERIES       reports that she has never smoked. She has never used smokeless tobacco. She reports that she does not drink alcohol or use drugs.  Allergies  Allergen Reactions  . Bupropion Other (See Comments)    Reaction:  Constipation     History reviewed. No pertinent family history.   Prior to Admission medications   Medication Sig Start Date End Date Taking? Authorizing Provider  FLUoxetine (PROZAC) 40 MG capsule Take 1 capsule (40 mg total) by mouth daily. 01/14/16  Yes Derrill Center, NP  hydrOXYzine (ATARAX/VISTARIL) 10 MG tablet Take 10 mg by mouth 3 (three) times daily as needed for anxiety.   Yes Historical Provider, MD  hydrOXYzine (ATARAX/VISTARIL) 25 MG tablet Take 25 mg by mouth at bedtime as needed (for sleep).   Yes Historical Provider, MD  levonorgestrel-ethinyl estradiol  (AVIANE,ALESSE,LESSINA) 0.1-20 MG-MCG tablet Take 1 tablet by mouth every evening.    Yes Historical Provider, MD  Multiple Vitamin (MULTIVITAMIN WITH MINERALS) TABS tablet Take 1 tablet by mouth daily.   Yes Historical Provider, MD    Physical Exam: Vitals:   07/04/16 1404 07/04/16 1605 07/04/16 1709 07/04/16 1743  BP: 119/90 (!) 122/96 114/90   Pulse: (!) 106 (!) 114 100   Resp: (!) 22 20 16    Temp: 97.5 F (36.4 C) 97.9 F (36.6 C)    TempSrc: Oral     SpO2: 97% 96% 99%   Weight:    129.7 kg (286 lb)  Height:    5\' 9"  (1.753 m)      Constitutional: No acute distress, calm, obsese Eyes: PERTLA, lids and conjunctivae normal ENMT: Mucous membranes are moist. Posterior pharynx clear of any exudate or lesions.   Neck: normal, supple, no masses, no thyromegaly Respiratory: clear to auscultation bilaterally, no wheezing, no crackles. Mild tachypnea. No accessory muscle use.  Cardiovascular: Rate ~110 and regular. No extremity edema. JVP not well-visualized. Abdomen: No distension, no tenderness, no masses palpated. Bowel sounds normal.  Musculoskeletal: no clubbing / cyanosis. No joint deformity upper and lower extremities.   Skin: no significant rashes, lesions, ulcers. Warm, dry, well-perfused. Neurologic: CN 2-12 grossly intact. Sensation intact, DTR normal. Strength 5/5 in all 4 limbs.  Psychiatric: Alert and oriented x 3. Normal mood and affect.     Labs on Admission: I have personally reviewed following labs and imaging studies  CBC:  Recent Labs Lab 07/04/16 1404  WBC 7.4  HGB 12.8  HCT 40.4  MCV 78.1  PLT 497   Basic Metabolic Panel:  Recent Labs Lab 07/04/16 1404  NA 140  K 3.4*  CL 110  CO2 21*  GLUCOSE 119*  BUN 10  CREATININE 1.11*  CALCIUM 9.3   GFR: Estimated Creatinine Clearance: 111.1 mL/min (A) (by C-G formula based on SCr of 1.11 mg/dL (H)). Liver Function Tests: No results for input(s): AST, ALT, ALKPHOS, BILITOT, PROT, ALBUMIN in the  last 168 hours. No results for input(s): LIPASE, AMYLASE in the last 168 hours. No results for input(s): AMMONIA in the last 168 hours. Coagulation Profile: No results for input(s): INR, PROTIME in the last 168 hours. Cardiac Enzymes: No results for input(s): CKTOTAL, CKMB, CKMBINDEX, TROPONINI in the last 168 hours. BNP (last 3 results) No results for input(s): PROBNP in the last 8760 hours. HbA1C: No results for input(s): HGBA1C in the last 72 hours. CBG: No results for input(s): GLUCAP in the last 168 hours. Lipid Profile: No results for input(s): CHOL, HDL, LDLCALC, TRIG, CHOLHDL, LDLDIRECT in the last 72 hours. Thyroid Function Tests: No results for input(s): TSH, T4TOTAL, FREET4, T3FREE, THYROIDAB in the last 72 hours. Anemia Panel: No results for input(s): VITAMINB12, FOLATE, FERRITIN, TIBC, IRON, RETICCTPCT in the last 72 hours. Urine analysis:    Component Value Date/Time   COLORURINE AMBER (A) 07/04/2016 1522   APPEARANCEUR CLOUDY (A) 07/04/2016 1522   LABSPEC 1.029 07/04/2016 1522   PHURINE 5.0 07/04/2016 1522   GLUCOSEU NEGATIVE 07/04/2016 1522   HGBUR SMALL (A) 07/04/2016 1522   BILIRUBINUR  NEGATIVE 07/04/2016 1522   KETONESUR NEGATIVE 07/04/2016 1522   PROTEINUR 100 (A) 07/04/2016 1522   NITRITE NEGATIVE 07/04/2016 1522   LEUKOCYTESUR TRACE (A) 07/04/2016 1522   Sepsis Labs: @LABRCNTIP (procalcitonin:4,lacticidven:4) )No results found for this or any previous visit (from the past 240 hour(s)).   Radiological Exams on Admission: Dg Chest 2 View  Result Date: 07/04/2016 CLINICAL DATA:  Shortness of breath for 3 weeks EXAM: CHEST  2 VIEW COMPARISON:  None. FINDINGS: Lungs are clear. Heart size and pulmonary vascularity are normal. No adenopathy. No bone lesions. IMPRESSION: No edema or consolidation. Electronically Signed   By: Lowella Grip III M.D.   On: 07/04/2016 14:31   Ct Angio Chest Pe W And/or Wo Contrast  Result Date: 07/04/2016 CLINICAL DATA:   Shortness of Breath EXAM: CT ANGIOGRAPHY CHEST WITH CONTRAST TECHNIQUE: Multidetector CT imaging of the chest was performed using the standard protocol during bolus administration of intravenous contrast. Multiplanar CT image reconstructions and MIPs were obtained to evaluate the vascular anatomy. CONTRAST:  100 mL Isovue 370 nonionic COMPARISON:  Chest radiograph July 04, 2016 FINDINGS: Cardiovascular: There is pulmonary embolism arising from each distal main pulmonary artery and extending into multiple upper and lower lobe pulmonary artery branches. The right ventricle to left ventricle diameter ratio is approximately 1.1, abnormal and indicative of right heart strain. There is no appreciable thoracic aortic aneurysm or dissection. The visualized great vessels appear normal. Note that the right and left common carotid arteries arise as a common trunk, an anatomic variant. There is no appreciable pericardial thickening. Mediastinum/Nodes: Thyroid appears normal. There is no appreciable adenopathy in the chest. Lungs/Pleura: The lungs are clear except for slight atelectasis in the right base laterally. No pleural effusion or pleural thickening. No findings felt to represent pulmonary infarction. Upper Abdomen: In the visualized upper abdomen, there is mild reflux of contrast into the inferior vena cava and hepatic veins. Visualized upper abdominal structures are otherwise normal. Musculoskeletal: There are no appreciable blastic or lytic bone lesions. Review of the MIP images confirms the above findings. IMPRESSION: Pulmonary emboli arising from the distal main pulmonary arteries extending into multiple peripheral branches bilaterally. Positive for acute PE with CT evidence of right heart strain (RV/LV Ratio = 1.1) consistent with at least submassive (intermediate risk) PE. The presence of right heart strain has been associated with an increased risk of morbidity and mortality. Please activate Code PE by paging  [phone]. Slight right base atelectasis. Lungs elsewhere clear. No adenopathy. Reflux of contrast into the inferior vena cava and hepatic veins may indicate a degree of increased right heart pressure. Critical Value/emergent results were called by telephone at the time of interpretation on 07/04/2016 at 5:43 pm to Dr. Nanda Quinton , who verbally acknowledged these results. Electronically Signed   By: Lowella Grip III M.D.   On: 07/04/2016 17:43    EKG: Independently reviewed. Sinus tachycardia (rate 111), inferolateral T-wave inversions, RSR' in V1 and V2, QTc 597 ms   Assessment/Plan  1. Acute PE with heart-strain, syncope   - Pt presents with DOE progressing over the past 3 wks with occasional pleuritic-type pain - CTA chest with with bilateral PE and RV/LV ratio of 1.1  - IV heparin infusion started in ED with pharmacy-assistance and will be continued  - Remains in sinus tachycardia, rate in low 100's, with stable BP, no hypoxia  - Cardiac enzymes wnl initially, will obtain serial measurements  - Echo ordered; check LE venous dopplers   2. Prolonged  QT interval  - QTc 597 ms on presentation, previously normal  - Potassium slightly low and supplemented as below; 1 g IV magnesium given  - Hold her Vistaril and Prozac for now, avoid other potentially prolonging agents as feasible  - Monitor on telemetry, repeat chem panel in am    3. Hypokalemia  - Serum potassium 3.4 - She was treated with 20 mEq oral potassium and 20 mEq IV, as well as 1 g IV mag  - Monitoring on telemetry given long QTc    4. Depression  - Pt reports this to be stable  - Prozac and prn Vistaril held on admission in light of prolonged QTc as above    DVT prophylaxis: IV heparin  Code Status: Full  Family Communication: Discussed with patient Disposition Plan: Admit to telemetry Consults called: Discussed case with PCCM  Admission status: Inpatient    Vianne Bulls, MD [facility] Pager  [phone]  If 7PM-7AM, please contact night-coverage [email or web address] Password [facility]  07/04/2016, 7:14 PM

## 2016-07-04 NOTE — ED Provider Notes (Signed)
Emergency Department Provider Note   I have reviewed the triage vital signs and the nursing notes.   HISTORY  Chief Complaint Shortness of Breath and Loss of Consciousness   HPI Shannon [name] is a 26 y.o. female with PMH of depression presents to the ED for evaluation of SOB for the last 3 weeks. No CP. At first the patient's discomfort was exertional and mostly when going up steps. Over the last week she's become increasingly dyspneic with walking even on flat surfaces. She's never had any pain or other discomfort. No fevers or chills. No productive cough. No leg swelling. The patient does take OCPs. No recent surgeries. No family history of blood clots.   The patient went to the [facility] and had labs done along with a d-dimer which was elevated and was sent to the ED.    Past Medical History:  Diagnosis Date  . Depression   . Medical history non-contributory     Patient Active Problem List   Diagnosis Date Noted  . Pulmonary embolism with acute cor pulmonale ([place]) 07/04/2016  . Hypokalemia 07/04/2016  . Prolonged QT interval 07/04/2016  . Depression 07/04/2016  . Adjustment disorder with emotional disturbance 03/31/2016  . Major depressive disorder, recurrent severe without psychotic features ([place]) 01/10/2016  . Major depressive disorder, recurrent episode, mild ([place]) 01/01/2016  . Suicide ideation 02/16/2013    Past Surgical History:  Procedure Laterality Date  . NO PAST SURGERIES      Current Outpatient Rx  . Order #: [id number] Class: Print  . Order #: [id number] Class: Historical Med  . Order #: [id number] Class: Historical Med  . Order #: [id number] Class: Historical Med  . Order #: [id number] Class: Historical Med    Allergies Bupropion  History reviewed. No pertinent family history.  Social History Social History  Substance Use Topics  . Smoking status: Never Smoker  . Smokeless tobacco: Never Used  . Alcohol use No    Review of  Systems  Constitutional: No fever/chills Eyes: No visual changes. ENT: No sore throat. Cardiovascular: Denies chest pain. Respiratory: Positive shortness of breath. Gastrointestinal: No abdominal pain.  No nausea, no vomiting.  No diarrhea.  No constipation. Genitourinary: Negative for dysuria. Musculoskeletal: Negative for back pain. Skin: Negative for rash. Neurological: Negative for headaches, focal weakness or numbness.  10-point ROS otherwise negative.  ____________________________________________   PHYSICAL EXAM:  VITAL SIGNS: ED Triage Vitals [07/04/16 1404]  Enc Vitals Group     BP 119/90     Pulse Rate (!) 106     Resp (!) 22     Temp 97.5 F (36.4 C)     Temp Source Oral     SpO2 97 %   Constitutional: Alert and oriented. Well appearing and in no acute distress. Eyes: Conjunctivae are normal.  Head: Atraumatic. Nose: No congestion/rhinnorhea. Mouth/Throat: Mucous membranes are moist.  Oropharynx non-erythematous. Neck: No stridor.   Cardiovascular: Tachycardia. Good peripheral circulation. Grossly normal heart sounds.   Respiratory: Normal respiratory effort.  No retractions. Lungs CTAB. Gastrointestinal: Soft and nontender. No distention.  Musculoskeletal: No lower extremity tenderness nor edema. No gross deformities of extremities. Neurologic:  Normal speech and language. No gross focal neurologic deficits are appreciated.  Skin:  Skin is warm, dry and intact. No rash noted. Psychiatric: Mood and affect are normal. Speech and behavior are normal.  ____________________________________________   LABS (all labs ordered are listed, but only abnormal results are displayed)  Labs Reviewed  BASIC METABOLIC PANEL - Abnormal; Notable  for the following:       Result Value   Potassium 3.4 (*)    CO2 21 (*)    Glucose, Bld 119 (*)    Creatinine, Ser 1.11 (*)    All other components within normal limits  CBC - Abnormal; Notable for the following:    RBC 5.17  (*)    MCH 24.8 (*)    All other components within normal limits  URINALYSIS, ROUTINE W REFLEX MICROSCOPIC - Abnormal; Notable for the following:    Color, Urine AMBER (*)    APPearance CLOUDY (*)    Hgb urine dipstick SMALL (*)    Protein, ur 100 (*)    Leukocytes, UA TRACE (*)    Bacteria, UA MANY (*)    Squamous Epithelial / LPF 6-30 (*)    All other components within normal limits  BRAIN NATRIURETIC PEPTIDE - Abnormal; Notable for the following:    B Natriuretic Peptide 484.7 (*)    All other components within normal limits  D-DIMER, QUANTITATIVE (NOT AT [facility]) - Abnormal; Notable for the following:    D-Dimer, Quant 8.43 (*)    All other components within normal limits  URINE CULTURE  PREGNANCY, URINE  TROPONIN I  TROPONIN I  I-STAT TROPOININ, ED  I-STAT TROPOININ, ED   ____________________________________________  EKG   EKG Interpretation  Date/Time:  Thursday July 04 2016 13:52:40 EDT Ventricular Rate:  111 PR Interval:    QRS Duration: 98 QT Interval:  439 QTC Calculation: 597 R Axis:   -17 Text Interpretation:  Sinus tachycardia Probable left atrial enlargement Borderline left axis deviation RSR' in V1 or V2, probably normal variant Abnormal T, consider ischemia, diffuse leads Prolonged QT interval Confirmed by Jeneen Rinks  MD, [place] ([id number]) on 07/04/2016 2:53:34 PM       ____________________________________________  RADIOLOGY  Dg Chest 2 View  Result Date: 07/04/2016 CLINICAL DATA:  Shortness of breath for 3 weeks EXAM: CHEST  2 VIEW COMPARISON:  None. FINDINGS: Lungs are clear. Heart size and pulmonary vascularity are normal. No adenopathy. No bone lesions. IMPRESSION: No edema or consolidation. Electronically Signed   By: Lowella Grip III M.D.   On: 07/04/2016 14:31   Ct Angio Chest Pe W And/or Wo Contrast  Result Date: 07/04/2016 CLINICAL DATA:  Shortness of Breath EXAM: CT ANGIOGRAPHY CHEST WITH CONTRAST TECHNIQUE: Multidetector CT imaging of the chest  was performed using the standard protocol during bolus administration of intravenous contrast. Multiplanar CT image reconstructions and MIPs were obtained to evaluate the vascular anatomy. CONTRAST:  100 mL Isovue 370 nonionic COMPARISON:  Chest radiograph July 04, 2016 FINDINGS: Cardiovascular: There is pulmonary embolism arising from each distal main pulmonary artery and extending into multiple upper and lower lobe pulmonary artery branches. The right ventricle to left ventricle diameter ratio is approximately 1.1, abnormal and indicative of right heart strain. There is no appreciable thoracic aortic aneurysm or dissection. The visualized great vessels appear normal. Note that the right and left common carotid arteries arise as a common trunk, an anatomic variant. There is no appreciable pericardial thickening. Mediastinum/Nodes: Thyroid appears normal. There is no appreciable adenopathy in the chest. Lungs/Pleura: The lungs are clear except for slight atelectasis in the right base laterally. No pleural effusion or pleural thickening. No findings felt to represent pulmonary infarction. Upper Abdomen: In the visualized upper abdomen, there is mild reflux of contrast into the inferior vena cava and hepatic veins. Visualized upper abdominal structures are otherwise normal. Musculoskeletal: There are no  appreciable blastic or lytic bone lesions. Review of the MIP images confirms the above findings. IMPRESSION: Pulmonary emboli arising from the distal main pulmonary arteries extending into multiple peripheral branches bilaterally. Positive for acute PE with CT evidence of right heart strain (RV/LV Ratio = 1.1) consistent with at least submassive (intermediate risk) PE. The presence of right heart strain has been associated with an increased risk of morbidity and mortality. Please activate Code PE by paging [phone]. Slight right base atelectasis. Lungs elsewhere clear. No adenopathy. Reflux of contrast into the  inferior vena cava and hepatic veins may indicate a degree of increased right heart pressure. Critical Value/emergent results were called by telephone at the time of interpretation on 07/04/2016 at 5:43 pm to Dr. Nanda Quinton , who verbally acknowledged these results. Electronically Signed   By: Lowella Grip III M.D.   On: 07/04/2016 17:43    ____________________________________________   PROCEDURES  Procedure(s) performed:   Procedures  CRITICAL CARE Performed by: Margette Fast Total critical care time: 45 minutes Critical care time was exclusive of separately billable procedures and treating other patients. Critical care was necessary to treat or prevent imminent or life-threatening deterioration. Critical care was time spent personally by me on the following activities: development of treatment plan with patient and/or surrogate as well as nursing, discussions with consultants, evaluation of patient's response to treatment, examination of patient, obtaining history from patient or surrogate, ordering and performing treatments and interventions, ordering and review of laboratory studies, ordering and review of radiographic studies, pulse oximetry and re-evaluation of patient's condition.  Nanda Quinton, MD Emergency Medicine  ____________________________________________   INITIAL IMPRESSION / ASSESSMENT AND PLAN / ED COURSE  Pertinent labs & imaging results that were available during my care of the patient were reviewed by me and considered in my medical decision making (see chart for details).  Patient resents to the emergency department for evaluation of 3 weeks of worsening dyspnea. No chest pain. Was seen soon health and had an elevated d-dimer. She is tachycardic here and mildly tachypneic. Normal blood pressure. Normal oxygen saturation. EKG largely unremarkable. Plan for CTA and BNP.   05:45 PM Updated patient regarding results of her CT scan. She has bilateral pulmonary  emboli with evidence of right heart strain on CT. She has heart rate in the high 90s currently. Oxygen normal on room air. Normal blood pressure. Will start heparin and discuss admission with medicine team.  Discussed patient's case with hospitalist, Dr. Myna Hidalgo. Patient and family (if present) updated with plan. Care transferred to hospitalist service.  I reviewed all nursing notes, vitals, pertinent old records, EKGs, labs, imaging (as available).  06:16 PM Spoke with Elink provider for Code PE. No additional therapy other than heparin, ECHO, and enzyme trending.  ____________________________________________  FINAL CLINICAL IMPRESSION(S) / ED DIAGNOSES  Final diagnoses:  PE (pulmonary thromboembolism) ([place])     MEDICATIONS GIVEN DURING THIS VISIT:  Medications  heparin bolus via infusion 5,000 Units (5,000 Units Intravenous Bolus from Bag 07/04/16 1811)    Followed by  heparin ADULT infusion 100 units/mL (25000 units/265mL sodium chloride 0.45%) (1,700 Units/hr Intravenous New Bag/Given 07/04/16 1812)  potassium chloride (K-DUR,KLOR-CON) CR tablet 30 mEq (not administered)  iopamidol (ISOVUE-370) 76 % injection 100 mL (100 mLs Intravenous Contrast Given 07/04/16 1727)     NEW OUTPATIENT MEDICATIONS STARTED DURING THIS VISIT:  None   Note:  This document was prepared using Dragon voice recognition software and may include unintentional dictation errors.  Nanda Quinton,  MD Emergency Medicine   Margette Fast, MD 07/05/16 1000

## 2016-07-04 NOTE — ED Notes (Signed)
Pt unable to give urine sample at present time

## 2016-07-04 NOTE — ED Notes (Signed)
Call to floor [phone] is ready bed ok to bring pt.

## 2016-07-05 ENCOUNTER — Inpatient Hospital Stay (HOSPITAL_COMMUNITY): Payer: BLUE CROSS/BLUE SHIELD

## 2016-07-05 DIAGNOSIS — I2699 Other pulmonary embolism without acute cor pulmonale: Secondary | ICD-10-CM

## 2016-07-05 LAB — TROPONIN I
Troponin I: 0.03 ng/mL (ref <0.03)
Troponin I: 0.04 ng/mL (ref <0.03)

## 2016-07-05 LAB — CBC
HEMATOCRIT: 34 % — AB (ref 36.0–46.0)
Hemoglobin: 10.7 g/dL — ABNORMAL LOW (ref 12.0–15.0)
MCH: 24.3 pg — AB (ref 26.0–34.0)
MCHC: 31.5 g/dL (ref 30.0–36.0)
MCV: 77.1 fL — ABNORMAL LOW (ref 78.0–100.0)
Platelets: 271 10*3/uL (ref 150–400)
RBC: 4.41 MIL/uL (ref 3.87–5.11)
RDW: 14.6 % (ref 11.5–15.5)
WBC: 7.5 10*3/uL (ref 4.0–10.5)

## 2016-07-05 LAB — BASIC METABOLIC PANEL
Anion gap: 9 (ref 5–15)
BUN: 9 mg/dL (ref 6–20)
CALCIUM: 8.4 mg/dL — AB (ref 8.9–10.3)
CO2: 21 mmol/L — AB (ref 22–32)
Chloride: 108 mmol/L (ref 101–111)
Creatinine, Ser: 1 mg/dL (ref 0.44–1.00)
GFR calc Af Amer: >60 mL/min (ref >60)
GFR calc non Af Amer: >60 mL/min (ref >60)
Glucose, Bld: 102 mg/dL — ABNORMAL HIGH (ref 65–99)
Potassium: 3.6 mmol/L (ref 3.5–5.1)
Sodium: 138 mmol/L (ref 135–145)

## 2016-07-05 LAB — ECHOCARDIOGRAM COMPLETE
Height: 69 in
WEIGHTICAEL: 4561.6 [oz_av]

## 2016-07-05 LAB — HEPARIN LEVEL (UNFRACTIONATED)
Heparin Unfractionated: 0.53 IU/mL (ref 0.30–0.70)
Heparin Unfractionated: 0.78 IU/mL — ABNORMAL HIGH (ref 0.30–0.70)

## 2016-07-05 LAB — MAGNESIUM: Magnesium: 2.2 mg/dL (ref 1.7–2.4)

## 2016-07-05 MED ORDER — APIXABAN 5 MG PO TABS: 5.0000 mg | ORAL_TABLET | Freq: Two times a day (BID) | ORAL | Status: DC

## 2016-07-05 MED ORDER — FLUOXETINE HCL 20 MG PO CAPS
40.0000 mg | ORAL_CAPSULE | Freq: Every day | ORAL | Status: DC
  Administered 2016-07-05: 40 mg via ORAL
  Filled 2016-07-05: qty 2

## 2016-07-05 MED ORDER — HYDROXYZINE HCL 25 MG PO TABS: 25.0000 mg | ORAL_TABLET | Freq: Every evening | ORAL | Status: DC | PRN

## 2016-07-05 MED ORDER — APIXABAN (ELIQUIS) EDUCATION KIT FOR DVT/PE PATIENTS
PACK | Freq: Once | Status: DC
  Filled 2016-07-05: qty 1

## 2016-07-05 MED ORDER — APIXABAN 5 MG PO TABS
10.0000 mg | ORAL_TABLET | Freq: Two times a day (BID) | ORAL | Status: DC
  Administered 2016-07-05 – 2016-07-06 (×3): 10 mg via ORAL
  Filled 2016-07-05 (×3): qty 2

## 2016-07-05 MED ORDER — POTASSIUM CHLORIDE IN NACL 20-0.9 MEQ/L-% IV SOLN
INTRAVENOUS | Status: DC
  Administered 2016-07-05 – 2016-07-06 (×2): via INTRAVENOUS
  Filled 2016-07-05 (×3): qty 1000

## 2016-07-05 MED ORDER — HYDROXYZINE HCL 10 MG PO TABS
10.0000 mg | ORAL_TABLET | Freq: Three times a day (TID) | ORAL | Status: DC | PRN
  Filled 2016-07-05: qty 1

## 2016-07-05 MED ORDER — POTASSIUM CHLORIDE CRYS ER 20 MEQ PO TBCR
40.0000 meq | EXTENDED_RELEASE_TABLET | Freq: Once | ORAL | Status: AC
  Administered 2016-07-05: 40 meq via ORAL
  Filled 2016-07-05: qty 2

## 2016-07-05 NOTE — Progress Notes (Signed)
ANTICOAGULATION CONSULT NOTE - Follow Up Consult  Pharmacy Consult for Heparin>>Eliquis Indication: pulmonary embolus  Allergies  Allergen Reactions  . Bupropion Other (See Comments)    Reaction:  Constipation     Patient Measurements: Height: '5\' 9"'$  (175.3 cm) Weight: 285 lb 1.6 oz (129.3 kg) IBW/kg (Calculated) : 66.2 Heparin Dosing Weight:   Vital Signs: Temp: 98.7 F (37.1 C) (04/13 0549) Temp Source: Oral (04/13 0549) BP: 130/89 (04/13 0549) Pulse Rate: 92 (04/13 0549)  Labs:  Recent Labs  07/04/16 1404 07/05/16 0011 07/05/16 0529 07/05/16 0803  HGB 12.8  --  10.7*  --   HCT 40.4  --  34.0*  --   PLT 300  --  271  --   HEPARINUNFRC  --  0.78*  --  0.53  CREATININE 1.11*  --  1.00  --   TROPONINI  --  0.04* 0.03*  --     Estimated Creatinine Clearance: 123 mL/min (by C-G formula based on SCr of 1 mg/dL).   [name] to transition from heparin to Eliquis.  Heparin level therapeutic on 1600 units/hr.  Hg 10.7 - pt has hx of heavy menstrual and rectal bleeding and on PO iron PTA.    Plan:  DC heparin drip Eliquis 10 mg po BID x 7 days then Eliquis 5 mg PO bid to start Friday 07/12/16 Pt educated on Eliquis and all questions answered.  She plans to stop taking oral contraceptives. Eliquis DC kit ordered for pt which contains 30 day free card.  Shannon [name], Pharm.D. [phone] 07/05/2016 12:08 PM

## 2016-07-05 NOTE — Progress Notes (Addendum)
**  Preliminary report by tech**  Bilateral lower extremity venous duplex complete. There is evidence of acute deep vein thrombosis involving the distal femoral, popliteal, and posterior tibial veins of the left lower extremity.  There is no evidence of superficial vein thrombosis involving the left lower extremity.  There is no evidence of deep or superficial vein thrombosis involving the right lower extremity. There is no evidence of a Baker's cyst bilaterally. Results were given to the patient's nurse, Danna.  07/05/16 9:02 AM Shannon [name] RVT

## 2016-07-05 NOTE — Progress Notes (Signed)
CRITICAL VALUE ALERT  Critical value received:  Troponin 0.04  Date of notification:  07/05/16  Time of notification:  0721  Critical value read back:Yes.    Nurse who received alert:  Linden Dolin  MD notified (1st page):  Baltazar Najjar, NP  Time of first page:  0003  MD notified (2nd page):  Time of second page:  Responding MD:    Time MD responded:

## 2016-07-05 NOTE — Progress Notes (Signed)
Both Eliquis and Xarelto co- pay is $60.00/pt's insurance for 30 days.

## 2016-07-05 NOTE — Progress Notes (Signed)
PROGRESS NOTE    Shannon [name]  ZOX:[id number] DOB: June 04, 1990 DOA: 07/04/2016 PCP: No PCP Per Patient    Brief Narrative:  26 y.o. female with medical history significant for depression and anxiety, now presenting at the direction of her PCP for evaluation of progressive dyspnea, a syncopal episode, and elevated d-dimer at the outpatient clinic. Patient reports that she had been in her usual state of health until approximately 3 weeks ago when she noted the insidious development of exertional dyspnea. Her shortness breath has continued to worsen over that interval and she has also experienced occasional chest pain on inspiration. There has not been any significant cough, she denies lower extremity swelling or tenderness, denies any recent surgery or prolonged immobilization, and denies any personal or family history of VTE. She takes oral contraception but does not smoke. She works as a Education officer, community, but makes short deliveries for [employer] and has not been in any prolonged car or plane ride. Patient reports sitting in her car earlier today trying to catch her breath when she believes she lost consciousness for a minute or so. She denies any headache, change in vision or hearing, or loss of coordination. There is no focal numbness or weakness and she denies any fall or head strike. She denies any history of easy bleeding or bruising and has been treated with iron supplements for history of iron deficiency anemia. She was seen at the [facility] clinic today for these complaints and d-dimer was obtained, returning elevated to 6.68 after she had already been sent to ED.   ED Course: Upon arrival to the ED, patient is found to be afebrile, saturating well on room air, mildly tachypneic and mildly tachycardic stable blood pressure. EKG features a sinus tachycardia with rate 111, inferolateral T-wave inversions, and QTc of 597 ms. Chemistry panel reveals a potassium of 3.4, bicarbonate of 21, and serum  creatinine 1.11. CBC is unremarkable. D-dimer is elevated to a value of 8.43 and troponin is within the normal limits 2 three hours apart. Chest x-ray was negative for edema or consolidation and CTA chest reveals PE arising from the distal main pulmonary arteries and extending into multiple peripheral branches bilaterally with evidence for right heart strain (RV/LV ratio 1.1). Patient was started on heparin infusion and the case was discussed with [facility] who advised no further treatment at this time pending cardiac enzyme trend and echocardiogram results. Patient remained hemodynamically stable and in no acute respiratory distress. She'll be admitted to the telemetry unit for ongoing evaluation and management of acute bilateral pulmonary embolus with right heart strain.  Assessment & Plan:   Principal Problem:   Pulmonary embolism with acute cor pulmonale (HCC) Active Problems:   Hypokalemia   Prolonged QT interval   Depression  1. Acute PE with heart-strain, syncope   - Pt presents with DOE progressing over the past 3 wks with occasional pleuritic-type pain - CTA chest with with bilateral PE and RV/LV ratio of 1.1  - IV heparin infusion was started in ED - Echo ordered, pending - LE dopplers confirmed LLE DVT; 2d echo pending  2. Prolonged QT interval  - QTc 597 ms on presentation, previously normal  - Potassium slightly low and supplemented as below; 1 g IV magnesium given  - Hold her Vistaril and Prozac for now, avoid other potentially prolonging agents as feasible  - repeat EKG for interval change   3. Hypokalemia  - Serum potassium 3.4 - She was treated with 20 mEq oral  potassium and 20 mEq IV, as well as 1 g IV mag  - Recheck electrolytes in AM   4. Depression  - Pt stable at present - Prozac and prn Vistaril held on admission in light of prolonged QTc as above   DVT prophylaxis: Heparin gtt, now eliquis Code Status: Full Family Communication: Pt in room, family at  bedside Disposition Plan: Uncertain at this time  Consultants:     Procedures:     Antimicrobials: Anti-infectives    None       Subjective: No complaints this AM  Objective: Vitals:   07/04/16 2019 07/04/16 2043 07/05/16 0549 07/05/16 1303  BP:  (!) 127/95 130/89 (!) 145/99  Pulse:  (!) 111 92 98  Resp:  20 18 18   Temp: 97.8 F (36.6 C) 97.5 F (36.4 C) 98.7 F (37.1 C) 98.7 F (37.1 C)  TempSrc: Oral Oral Oral Oral  SpO2:  99% 98% 100%  Weight:  129.3 kg (285 lb 1.6 oz)    Height:  5\' 9"  (1.753 m)      Intake/Output Summary (Last 24 hours) at 07/05/16 1418 Last data filed at 07/05/16 1214  Gross per 24 hour  Intake          1201.36 ml  Output                0 ml  Net          1201.36 ml   Filed Weights   07/04/16 1743 07/04/16 2043  Weight: 129.7 kg (286 lb) 129.3 kg (285 lb 1.6 oz)    Examination:  General exam: Appears calm and comfortable  Respiratory system: Clear to auscultation. Respiratory effort normal. Cardiovascular system: S1 & S2 heard, RRR. No JVD, murmurs, rubs, gallops or clicks. No pedal edema. Gastrointestinal system: Abdomen is nondistended, soft and nontender. No organomegaly or masses felt. Normal bowel sounds heard. Central nervous system: Alert and oriented. No focal neurological deficits. Extremities: Symmetric 5 x 5 power. Skin: No rashes, lesions Psychiatry: Judgement and insight appear normal. Mood & affect appropriate.   Data Reviewed: I have personally reviewed following labs and imaging studies  CBC:  Recent Labs Lab 07/04/16 1404 07/05/16 0529  WBC 7.4 7.5  HGB 12.8 10.7*  HCT 40.4 34.0*  MCV 78.1 77.1*  PLT 300 347   Basic Metabolic Panel:  Recent Labs Lab 07/04/16 1404 07/05/16 0529  NA 140 138  K 3.4* 3.6  CL 110 108  CO2 21* 21*  GLUCOSE 119* 102*  BUN 10 9  CREATININE 1.11* 1.00  CALCIUM 9.3 8.4*  MG  --  2.2   GFR: Estimated Creatinine Clearance: 123 mL/min (by C-G formula based on SCr of  1 mg/dL). Liver Function Tests: No results for input(s): AST, ALT, ALKPHOS, BILITOT, PROT, ALBUMIN in the last 168 hours. No results for input(s): LIPASE, AMYLASE in the last 168 hours. No results for input(s): AMMONIA in the last 168 hours. Coagulation Profile: No results for input(s): INR, PROTIME in the last 168 hours. Cardiac Enzymes:  Recent Labs Lab 07/05/16 0011 07/05/16 0529  TROPONINI 0.04* 0.03*   BNP (last 3 results) No results for input(s): PROBNP in the last 8760 hours. HbA1C: No results for input(s): HGBA1C in the last 72 hours. CBG: No results for input(s): GLUCAP in the last 168 hours. Lipid Profile: No results for input(s): CHOL, HDL, LDLCALC, TRIG, CHOLHDL, LDLDIRECT in the last 72 hours. Thyroid Function Tests: No results for input(s): TSH, T4TOTAL, FREET4, T3FREE, THYROIDAB in  the last 72 hours. Anemia Panel: No results for input(s): VITAMINB12, FOLATE, FERRITIN, TIBC, IRON, RETICCTPCT in the last 72 hours. Sepsis Labs: No results for input(s): PROCALCITON, LATICACIDVEN in the last 168 hours.  No results found for this or any previous visit (from the past 240 hour(s)).   Radiology Studies: Dg Chest 2 View  Result Date: 07/04/2016 CLINICAL DATA:  Shortness of breath for 3 weeks EXAM: CHEST  2 VIEW COMPARISON:  None. FINDINGS: Lungs are clear. Heart size and pulmonary vascularity are normal. No adenopathy. No bone lesions. IMPRESSION: No edema or consolidation. Electronically Signed   By: Lowella Grip III M.D.   On: 07/04/2016 14:31   Ct Angio Chest Pe W And/or Wo Contrast  Result Date: 07/04/2016 CLINICAL DATA:  Shortness of Breath EXAM: CT ANGIOGRAPHY CHEST WITH CONTRAST TECHNIQUE: Multidetector CT imaging of the chest was performed using the standard protocol during bolus administration of intravenous contrast. Multiplanar CT image reconstructions and MIPs were obtained to evaluate the vascular anatomy. CONTRAST:  100 mL Isovue 370 nonionic COMPARISON:   Chest radiograph July 04, 2016 FINDINGS: Cardiovascular: There is pulmonary embolism arising from each distal main pulmonary artery and extending into multiple upper and lower lobe pulmonary artery branches. The right ventricle to left ventricle diameter ratio is approximately 1.1, abnormal and indicative of right heart strain. There is no appreciable thoracic aortic aneurysm or dissection. The visualized great vessels appear normal. Note that the right and left common carotid arteries arise as a common trunk, an anatomic variant. There is no appreciable pericardial thickening. Mediastinum/Nodes: Thyroid appears normal. There is no appreciable adenopathy in the chest. Lungs/Pleura: The lungs are clear except for slight atelectasis in the right base laterally. No pleural effusion or pleural thickening. No findings felt to represent pulmonary infarction. Upper Abdomen: In the visualized upper abdomen, there is mild reflux of contrast into the inferior vena cava and hepatic veins. Visualized upper abdominal structures are otherwise normal. Musculoskeletal: There are no appreciable blastic or lytic bone lesions. Review of the MIP images confirms the above findings. IMPRESSION: Pulmonary emboli arising from the distal main pulmonary arteries extending into multiple peripheral branches bilaterally. Positive for acute PE with CT evidence of right heart strain (RV/LV Ratio = 1.1) consistent with at least submassive (intermediate risk) PE. The presence of right heart strain has been associated with an increased risk of morbidity and mortality. Please activate Code PE by paging [phone]. Slight right base atelectasis. Lungs elsewhere clear. No adenopathy. Reflux of contrast into the inferior vena cava and hepatic veins may indicate a degree of increased right heart pressure. Critical Value/emergent results were called by telephone at the time of interpretation on 07/04/2016 at 5:43 pm to Dr. Nanda Quinton , who verbally  acknowledged these results. Electronically Signed   By: Lowella Grip III M.D.   On: 07/04/2016 17:43    Scheduled Meds: . apixaban   Does not apply Once  . apixaban  10 mg Oral BID   Followed by  . [START ON 07/12/2016] apixaban  5 mg Oral BID  . FLUoxetine  40 mg Oral Daily  . multivitamin with minerals  1 tablet Oral Daily  . sodium chloride flush  3 mL Intravenous Q12H   Continuous Infusions: . 0.9 % NaCl with KCl 20 mEq / L 75 mL/hr at 07/05/16 1205     LOS: 1 day   CHIU, Orpah Melter, MD [facility] Pager [phone]  If 7PM-7AM, please contact night-coverage [email or web address] Password [email or web address] 07/05/2016, 2:18 PM

## 2016-07-05 NOTE — Progress Notes (Signed)
ANTICOAGULATION CONSULT NOTE - Follow Up Consult  Pharmacy Consult for Heparin Indication: pulmonary embolus  Allergies  Allergen Reactions  . Bupropion Other (See Comments)    Reaction:  Constipation     Patient Measurements: Height: 5\' 9"  (175.3 cm) Weight: 285 lb 1.6 oz (129.3 kg) IBW/kg (Calculated) : 66.2 Heparin Dosing Weight:   Vital Signs: Temp: 97.5 F (36.4 C) (04/12 2043) Temp Source: Oral (04/12 2043) BP: 127/95 (04/12 2043) Pulse Rate: 111 (04/12 2043)  Labs:  Recent Labs  07/04/16 1404 07/05/16 0011  HGB 12.8  --   HCT 40.4  --   PLT 300  --   HEPARINUNFRC  --  0.78*  CREATININE 1.11*  --     Estimated Creatinine Clearance: 110.8 mL/min (A) (by C-G formula based on SCr of 1.11 mg/dL (H)).   Medications:  Infusions:  . 0.9 % NaCl with KCl 20 mEq / L 75 mL/hr at 07/04/16 2155  . heparin 1,700 Units/hr (07/04/16 1812)    Assessment: Patient with heparin level above goal.  No Heparin issues noted per RN.  Goal of Therapy:  Heparin level 0.3-0.7 units/ml Monitor platelets by anticoagulation protocol: Yes   Plan:  Decrease heparin to 1600 units/hr Recheck level at [place] 07/05/2016,12:42 AM

## 2016-07-05 NOTE — Progress Notes (Signed)
  Echocardiogram 2D Echocardiogram has been performed.  Somer Trotter L Androw 07/05/2016, 2:47 PM

## 2016-07-06 LAB — CBC
HEMATOCRIT: 33.2 % — AB (ref 36.0–46.0)
HEMOGLOBIN: 10.5 g/dL — AB (ref 12.0–15.0)
MCH: 24.5 pg — AB (ref 26.0–34.0)
MCHC: 31.6 g/dL (ref 30.0–36.0)
MCV: 77.6 fL — AB (ref 78.0–100.0)
Platelets: 241 10*3/uL (ref 150–400)
RBC: 4.28 MIL/uL (ref 3.87–5.11)
RDW: 14.7 % (ref 11.5–15.5)
WBC: 5.7 10*3/uL (ref 4.0–10.5)

## 2016-07-06 LAB — BASIC METABOLIC PANEL
ANION GAP: 7 (ref 5–15)
BUN: 10 mg/dL (ref 6–20)
CHLORIDE: 111 mmol/L (ref 101–111)
CO2: 21 mmol/L — AB (ref 22–32)
Calcium: 8.6 mg/dL — ABNORMAL LOW (ref 8.9–10.3)
Creatinine, Ser: 1.02 mg/dL — ABNORMAL HIGH (ref 0.44–1.00)
GFR calc non Af Amer: >60 mL/min (ref >60)
GLUCOSE: 98 mg/dL (ref 65–99)
POTASSIUM: 4.3 mmol/L (ref 3.5–5.1)
Sodium: 139 mmol/L (ref 135–145)

## 2016-07-06 LAB — HIV ANTIBODY (ROUTINE TESTING W REFLEX): HIV Screen 4th Generation wRfx: NONREACTIVE

## 2016-07-06 LAB — URINE CULTURE

## 2016-07-06 MED ORDER — APIXABAN 5 MG PO TABS: 5.0000 mg | ORAL_TABLET | Freq: Two times a day (BID) | ORAL | 0 refills | Status: DC

## 2016-07-06 MED ORDER — FLUOXETINE HCL 20 MG PO CAPS: 20.0000 mg | ORAL_CAPSULE | Freq: Every day | ORAL | 0 refills | Status: DC

## 2016-07-06 MED ORDER — APIXABAN 5 MG PO TABS: 10.0000 mg | ORAL_TABLET | Freq: Two times a day (BID) | ORAL | 0 refills | Status: DC

## 2016-07-06 MED ORDER — APIXABAN (ELIQUIS) EDUCATION KIT FOR DVT/PE PATIENTS: 1.0000 | PACK | Freq: Once | Status: AC

## 2016-07-06 NOTE — Care Management Note (Signed)
Case Management Note  Patient Details  Name: Shannon [name] MRN: [id number] Date of Birth: Apr 23, 1990  Subjective/Objective:    PE                Action/Plan: Discharge Planning: NCM spoke to pt at bedside. States her insurance is with the [employer]. She used the on campus clinic. Gave permission to speak with mother, [name] # [phone]. Attempted call to mother, no voicemail.     Expected Discharge Date:  07/06/16               Expected Discharge Plan:  Home/Self Care  In-House Referral:  NA  Discharge planning Services  CM Consult, Medication Assistance  Post Acute Care Choice:  NA Choice offered to:  NA  DME Arranged:  N/A DME Agency:  NA  HH Arranged:  NA HH Agency:  NA  Status of Service:  Completed, signed off  If discussed at [place] of Stay Meetings, dates discussed:    Additional Comments:  Erenest Rasher, RN 07/06/2016, 3:49 PM

## 2016-07-06 NOTE — Discharge Summary (Addendum)
Physician Discharge Summary  Shannon [name] OXB:[id number] DOB: 04/12/1990 DOA: 07/04/2016  PCP: No PCP Per Patient Dr. Keith Rake Psychiatry: Delrae Alfred, NP  Admit date: 07/04/2016 Discharge date: 07/06/2016  Admitted From: Home Disposition:  Home  Recommendations for Outpatient Follow-up:  1. Follow up with PCP in 1-2 weeks 2. Follow up with Psychiatry in 1-2 weeks 3. Consider alternative depression regimen which would least affect QT prolongation. Note that fluoxetine dose decreased to '20mg'$  as a result 4. Consider referral to Hematology as outpatient  Discharge Condition:Stable CODE STATUS:Full Diet recommendation: Regular   Brief/Interim Summary: 26 y.o.femalewith medical history significant for depression and anxiety, now presenting at the direction of her PCP for evaluation of progressive dyspnea, a syncopal episode, and elevated d-dimer at the outpatient clinic. Patient reports that she had been in her usual state of health until approximately 3 weeks ago when she noted the insidious development of exertional dyspnea. Her shortness breath has continued to worsen over that interval and she has also experienced occasional chest pain on inspiration. There has not been any significant cough, she denies lower extremity swelling or tenderness, denies any recent surgery or prolonged immobilization, and denies any personal or family history of VTE. She takes oral contraception but does not smoke. She works as a Education officer, community, but makes short deliveries for [employer] and has not been in any prolonged car or plane ride. Patient reports sitting in her car earlier today trying to catch her breath when she believes she lost consciousness for a minute or so. She denies any headache, change in vision or hearing, or loss of coordination. There is no focal numbness or weakness and she denies any fall or head strike. She denies any history of easy bleeding or bruising and has been treated  with iron supplements for history of iron deficiency anemia. She was seen at the [facility] clinic today for these complaints and d-dimer was obtained, returning elevated to 6.68 after she had already been sent to ED.   ED Course:Upon arrival to the ED, patient is found to beafebrile, saturating well on room air, mildly tachypneic and mildly tachycardic stable blood pressure. EKG features a sinus tachycardia with rate 111, inferolateral T-wave inversions, and QTc of 597 ms. Chemistry panel reveals a potassium of 3.4, bicarbonate of 21, and serum creatinine 1.11. CBC is unremarkable. D-dimer is elevated to a value of 8.43 and troponin is within the normal limits 2 three hours apart. Chest x-ray was negative for edema or consolidation and CTA chest reveals PE arising from the distal main pulmonary arteries and extending into multiple peripheral branches bilaterally with evidence for right heart strain (RV/LV ratio 1.1). Patient was started on heparin infusion and the case was discussed with [facility] who advised no further treatment at this time pending cardiac enzyme trend and echocardiogram results. Patient remained hemodynamically stable and in no acute respiratory distress. She'll be admitted to the telemetry unit for ongoing evaluation and management of acute bilateral pulmonary embolus with right heart strain.  1. Acute PE with heart-strain, syncope  - Pt presents with DOE progressing over the past 3 wks with occasional pleuritic-type pain - CTA chest with with bilateral PE and RV/LV ratio of 1.1  - IV heparin infusion was started in ED - Echo ordered, evidence of R heart strain - LE dopplers confirmed LLE DVT - Patient remained stable - Started on eliquis  2. Prolonged QT interval  - QTc 597 ms on presentation, previously normal  - Potassium  slightly low and supplemented as below; 1 g IV magnesium given  - Hold her Vistaril  - Prozac dose decreased to '20mg'$  daily - Follow up with psych, consider  alternative regimen if possible  3. Hypokalemia  - Serum potassium 3.4 - She was treated with 20 mEq oral potassium and 20 mEq IV, as well as 1 g IV mag  - stable  4. Depression  - Pt stable at present - Prozac and prn Vistaril held on admission in light of prolonged QTc as above - Prozac continued on reduced dose for now  Morbid obesity  Discharge Diagnoses:  Principal Problem:   Pulmonary embolism with acute cor pulmonale (HCC) Active Problems:   Hypokalemia   Prolonged QT interval   Depression    Discharge Instructions   Allergies as of 07/06/2016      Reactions   Bupropion Other (See Comments)   Reaction:  Constipation       Medication List    STOP taking these medications   hydrOXYzine 10 MG tablet Commonly known as:  ATARAX/VISTARIL   hydrOXYzine 25 MG tablet Commonly known as:  ATARAX/VISTARIL   levonorgestrel-ethinyl estradiol 0.1-20 MG-MCG tablet Commonly known as:  AVIANE,ALESSE,LESSINA     TAKE these medications   apixaban 5 MG Tabs tablet Commonly known as:  ELIQUIS Take 2 tablets (10 mg total) by mouth 2 (two) times daily.   apixaban 5 MG Tabs tablet Commonly known as:  ELIQUIS Take 1 tablet (5 mg total) by mouth 2 (two) times daily. Start after finishing '10mg'$  twice daily tabs Start taking on:  07/12/2016   apixaban Kit Commonly known as:  ELIQUIS 1 kit by Does not apply route once.   FLUoxetine 20 MG capsule Commonly known as:  PROZAC Take 1 capsule (20 mg total) by mouth daily. What changed:  medication strength  how much to take   multivitamin with minerals Tabs tablet Take 1 tablet by mouth daily.      Follow-up Information    BECK,MATTHEW TODD, MD. Schedule an appointment as soon as possible for a visit in 1 week(s).   Specialty:  Family Medicine Contact information: [address] [place] [address] [phone]        Madison Hickman, NP. Schedule an appointment as soon as possible for a visit in 1 week(s).    Specialty:  Psychiatry         Allergies  Allergen Reactions  . Bupropion Other (See Comments)    Reaction:  Constipation     Consultations:  Pulmonary  Procedures/Studies: Dg Chest 2 View  Result Date: 07/04/2016 CLINICAL DATA:  Shortness of breath for 3 weeks EXAM: CHEST  2 VIEW COMPARISON:  None. FINDINGS: Lungs are clear. Heart size and pulmonary vascularity are normal. No adenopathy. No bone lesions. IMPRESSION: No edema or consolidation. Electronically Signed   By: Lowella Grip III M.D.   On: 07/04/2016 14:31   Ct Angio Chest Pe W And/or Wo Contrast  Result Date: 07/04/2016 CLINICAL DATA:  Shortness of Breath EXAM: CT ANGIOGRAPHY CHEST WITH CONTRAST TECHNIQUE: Multidetector CT imaging of the chest was performed using the standard protocol during bolus administration of intravenous contrast. Multiplanar CT image reconstructions and MIPs were obtained to evaluate the vascular anatomy. CONTRAST:  100 mL Isovue 370 nonionic COMPARISON:  Chest radiograph July 04, 2016 FINDINGS: Cardiovascular: There is pulmonary embolism arising from each distal main pulmonary artery and extending into multiple upper and lower lobe pulmonary artery branches. The right ventricle to left ventricle diameter  ratio is approximately 1.1, abnormal and indicative of right heart strain. There is no appreciable thoracic aortic aneurysm or dissection. The visualized great vessels appear normal. Note that the right and left common carotid arteries arise as a common trunk, an anatomic variant. There is no appreciable pericardial thickening. Mediastinum/Nodes: Thyroid appears normal. There is no appreciable adenopathy in the chest. Lungs/Pleura: The lungs are clear except for slight atelectasis in the right base laterally. No pleural effusion or pleural thickening. No findings felt to represent pulmonary infarction. Upper Abdomen: In the visualized upper abdomen, there is mild reflux of contrast into the inferior  vena cava and hepatic veins. Visualized upper abdominal structures are otherwise normal. Musculoskeletal: There are no appreciable blastic or lytic bone lesions. Review of the MIP images confirms the above findings. IMPRESSION: Pulmonary emboli arising from the distal main pulmonary arteries extending into multiple peripheral branches bilaterally. Positive for acute PE with CT evidence of right heart strain (RV/LV Ratio = 1.1) consistent with at least submassive (intermediate risk) PE. The presence of right heart strain has been associated with an increased risk of morbidity and mortality. Please activate Code PE by paging [phone]. Slight right base atelectasis. Lungs elsewhere clear. No adenopathy. Reflux of contrast into the inferior vena cava and hepatic veins may indicate a degree of increased right heart pressure. Critical Value/emergent results were called by telephone at the time of interpretation on 07/04/2016 at 5:43 pm to Dr. Nanda Quinton , who verbally acknowledged these results. Electronically Signed   By: Lowella Grip III M.D.   On: 07/04/2016 17:43     Subjective: No complaints  Discharge Exam: Vitals:   07/05/16 2044 07/06/16 0425  BP: (!) 136/92 (!) 139/95  Pulse: 97 86  Resp: 18 18  Temp: 98.7 F (37.1 C) 98.5 F (36.9 C)   Vitals:   07/05/16 1303 07/05/16 2044 07/06/16 0425 07/06/16 0548  BP: (!) 145/99 (!) 136/92 (!) 139/95   Pulse: 98 97 86   Resp: '18 18 18   '$ Temp: 98.7 F (37.1 C) 98.7 F (37.1 C) 98.5 F (36.9 C)   TempSrc: Oral Oral Oral   SpO2: 100% 100% 99%   Weight:    129.2 kg (284 lb 13.4 oz)  Height:        General: Pt is alert, awake, not in acute distress Cardiovascular: RRR, S1/S2 +, no rubs, no gallops Respiratory: CTA bilaterally, no wheezing, no rhonchi Abdominal: Soft, NT, ND, bowel sounds + Extremities: no edema, no cyanosis   The results of significant diagnostics from this hospitalization (including imaging, microbiology, ancillary  and laboratory) are listed below for reference.     Microbiology: Recent Results (from the past 240 hour(s))  Urine culture     Status: Abnormal   Collection Time: 07/04/16  3:22 PM  Result Value Ref Range Status   Specimen Description URINE, RANDOM  Final   Special Requests NONE  Final   Culture MULTIPLE SPECIES PRESENT, SUGGEST RECOLLECTION (A)  Final   Report Status 07/06/2016 FINAL  Final     Labs: BNP (last 3 results)  Recent Labs  07/04/16 1404  BNP 973.5*   Basic Metabolic Panel:  Recent Labs Lab 07/04/16 1404 07/05/16 0529 07/06/16 0536  NA 140 138 139  K 3.4* 3.6 4.3  CL 110 108 111  CO2 21* 21* 21*  GLUCOSE 119* 102* 98  BUN '10 9 10  '$ CREATININE 1.11* 1.00 1.02*  CALCIUM 9.3 8.4* 8.6*  MG  --  2.2  --  Liver Function Tests: No results for input(s): AST, ALT, ALKPHOS, BILITOT, PROT, ALBUMIN in the last 168 hours. No results for input(s): LIPASE, AMYLASE in the last 168 hours. No results for input(s): AMMONIA in the last 168 hours. CBC:  Recent Labs Lab 07/04/16 1404 07/05/16 0529 07/06/16 0536  WBC 7.4 7.5 5.7  HGB 12.8 10.7* 10.5*  HCT 40.4 34.0* 33.2*  MCV 78.1 77.1* 77.6*  PLT 300 271 241   Cardiac Enzymes:  Recent Labs Lab 07/05/16 0011 07/05/16 0529  TROPONINI 0.04* 0.03*   BNP: Invalid input(s): POCBNP CBG: No results for input(s): GLUCAP in the last 168 hours. D-Dimer  Recent Labs  07/04/16 1404  DDIMER 8.43*   Hgb A1c No results for input(s): HGBA1C in the last 72 hours. Lipid Profile No results for input(s): CHOL, HDL, LDLCALC, TRIG, CHOLHDL, LDLDIRECT in the last 72 hours. Thyroid function studies No results for input(s): TSH, T4TOTAL, T3FREE, THYROIDAB in the last 72 hours.  Invalid input(s): FREET3 Anemia work up No results for input(s): VITAMINB12, FOLATE, FERRITIN, TIBC, IRON, RETICCTPCT in the last 72 hours. Urinalysis    Component Value Date/Time   COLORURINE AMBER (A) 07/04/2016 1522   APPEARANCEUR  CLOUDY (A) 07/04/2016 1522   LABSPEC 1.029 07/04/2016 1522   PHURINE 5.0 07/04/2016 1522   GLUCOSEU NEGATIVE 07/04/2016 1522   HGBUR SMALL (A) 07/04/2016 1522   BILIRUBINUR NEGATIVE 07/04/2016 1522   KETONESUR NEGATIVE 07/04/2016 1522   PROTEINUR 100 (A) 07/04/2016 1522   NITRITE NEGATIVE 07/04/2016 1522   LEUKOCYTESUR TRACE (A) 07/04/2016 1522   Sepsis Labs Invalid input(s): PROCALCITONIN,  WBC,  LACTICIDVEN Microbiology Recent Results (from the past 240 hour(s))  Urine culture     Status: Abnormal   Collection Time: 07/04/16  3:22 PM  Result Value Ref Range Status   Specimen Description URINE, RANDOM  Final   Special Requests NONE  Final   Culture MULTIPLE SPECIES PRESENT, SUGGEST RECOLLECTION (A)  Final   Report Status 07/06/2016 FINAL  Final     SIGNED:   Donne Hazel, MD  [facility] 07/06/2016, 1:30 PM  If 7PM-7AM, please contact night-coverage [email or web address] Password [email or web address]

## 2016-07-06 NOTE — Progress Notes (Signed)
Pt discharged, instruction reviewed, questions addressed, acknowlegded understanding. Pt in stable condition, family at bedside. SRP,RN

## 2016-07-11 DIAGNOSIS — I2609 Other pulmonary embolism with acute cor pulmonale: Secondary | ICD-10-CM | POA: Diagnosis not present

## 2016-07-19 ENCOUNTER — Encounter: Payer: Self-pay | Admitting: Oncology

## 2016-07-19 ENCOUNTER — Telehealth: Payer: Self-pay | Admitting: Oncology

## 2016-07-19 NOTE — Telephone Encounter (Signed)
Pt cld on 4/26 to schedule a hem appt. Pt was seen in the hosp and was advised to see a hem due to PE. Appt has been scheduled for the pt to see Dr. Alen Blew on 5/24 at 11am. Pt aware to arrive 30 minutes early. Demographics verified. Letter mailed to the pt.

## 2016-07-25 DIAGNOSIS — R9431 Abnormal electrocardiogram [ECG] [EKG]: Secondary | ICD-10-CM | POA: Diagnosis not present

## 2016-07-25 DIAGNOSIS — I2609 Other pulmonary embolism with acute cor pulmonale: Secondary | ICD-10-CM | POA: Diagnosis not present

## 2016-07-26 DIAGNOSIS — H52223 Regular astigmatism, bilateral: Secondary | ICD-10-CM | POA: Diagnosis not present

## 2016-08-06 ENCOUNTER — Encounter: Payer: Self-pay | Admitting: Family

## 2016-08-06 ENCOUNTER — Other Ambulatory Visit (INDEPENDENT_AMBULATORY_CARE_PROVIDER_SITE_OTHER): Payer: BLUE CROSS/BLUE SHIELD

## 2016-08-06 ENCOUNTER — Ambulatory Visit (INDEPENDENT_AMBULATORY_CARE_PROVIDER_SITE_OTHER): Payer: BLUE CROSS/BLUE SHIELD | Admitting: Family

## 2016-08-06 VITALS — BP 124/84 | HR 84 | Temp 98.7°F | Resp 16 | Ht 69.0 in | Wt 288.0 lb

## 2016-08-06 DIAGNOSIS — R5383 Other fatigue: Secondary | ICD-10-CM | POA: Insufficient documentation

## 2016-08-06 DIAGNOSIS — I2609 Other pulmonary embolism with acute cor pulmonale: Secondary | ICD-10-CM

## 2016-08-06 LAB — CBC
HEMATOCRIT: 38.7 % (ref 36.0–46.0)
Hemoglobin: 12.5 g/dL (ref 12.0–15.0)
MCHC: 32.2 g/dL (ref 30.0–36.0)
MCV: 76.8 fl — ABNORMAL LOW (ref 78.0–100.0)
Platelets: 426 10*3/uL — ABNORMAL HIGH (ref 150.0–400.0)
RBC: 5.04 Mil/uL (ref 3.87–5.11)
RDW: 15.2 % (ref 11.5–15.5)
WBC: 5.1 10*3/uL (ref 4.0–10.5)

## 2016-08-06 LAB — TSH: TSH: 1.69 u[IU]/mL (ref 0.35–4.50)

## 2016-08-06 LAB — IBC PANEL
IRON: 20 ug/dL — AB (ref 42–145)
Saturation Ratios: 5.4 % — ABNORMAL LOW (ref 20.0–50.0)
Transferrin: 263 mg/dL (ref 212.0–360.0)

## 2016-08-06 LAB — FERRITIN: Ferritin: 22.3 ng/mL (ref 10.0–291.0)

## 2016-08-06 NOTE — Assessment & Plan Note (Signed)
Symptoms of fatigue with concern for previous blood work showing microcytosis consistent with iron deficiency. Obtain CBC, IBC panel, and ferritin. Obtain TSH to rule out thyroid dysfunction. Does have some issues with sleep and not feeling well rested. Cannot rule out exacerbation of depression. Consider sleep study if blood work is negative.

## 2016-08-06 NOTE — Patient Instructions (Addendum)
Thank you for choosing [facility].  SUMMARY AND INSTRUCTIONS:  We will check your blood work today.  Please continue to take the Eliquis as prescribed.  Please decrease sodium in your diet - recommend 2.5-3 grams per day.  Elevate your leg when seated.  Avoid ibuprofen, naproxen, and aspirin.   We will consider a sleep study.   Medication:  Your prescription(s) have been submitted to your pharmacy or been printed and provided for you. Please take as directed and contact our office if you believe you are having problem(s) with the medication(s) or have any questions.  Labs:  Please stop by the lab on the lower level of the building for your blood work. Your results will be released to [facility] (or called to you) after review, usually within 72 hours after test completion. If any changes need to be made, you will be notified at that same time.  1.) The lab is open from 7:30am to 5:30 pm Monday-Friday 2.) No appointment is necessary 3.) Fasting (if needed) is 6-8 hours after food and drink; black coffee and water are okay    Referrals:  Referrals have been made during this visit. You should expect to hear back from our schedulers in about 7-10 days in regards to establishing an appointment with the specialists we discussed.   Follow up:  If your symptoms worsen or fail to improve, please contact our office for further instruction, or in case of emergency go directly to the emergency room at the closest medical facility.     Pulmonary Embolism A pulmonary embolism (PE) is a sudden blockage or decrease of blood flow in one lung or both lungs. Most blockages come from a blood clot that travels from the legs or the pelvis to the lungs. PE is a dangerous and potentially life-threatening condition if it is not treated right away. What are the causes? A pulmonary embolism occurs most commonly when a blood clot travels from one of your veins to your lungs. Rarely, PE is caused  by air, fat, amniotic fluid, or part of a tumor traveling through your veins to your lungs. What increases the risk? A PE is more likely to develop in:  People who smoke.  People who areolder, especially over 57 years of age.  People who are overweight (obese).  People who sit or lie still for a long time, such as during long-distance travel (over 4 hours), bed rest, hospitalization, or during recovery from certain medical conditions like a stroke.  People who do not engage in much physical activity (sedentary lifestyle).  People who have chronic breathing disorders.  People whohave a personal or family history of blood clots or blood clotting disease.  People whohave peripheral vascular disease (PVD), diabetes, or some types of cancer.  People who haveheart disease, especially if the person had a recent heart attack or has congestive heart failure.  People who have neurological diseases that affect the legs (leg paresis).  People who have had a traumatic injury, such as breaking a hip or leg.  People whohave recently had major or lengthy surgery, especially on the hip, knee, or abdomen.  People who have hada central line placed inside a large vein.  People who takemedicines that contain the hormone estrogen. These include birth control pills and hormone replacement therapy.  Pregnancy or during childbirth or the postpartum period. What are the signs or symptoms? The symptoms of a PE usually start suddenly and include:  Shortness of breath while active or at  rest.  Coughing or coughing up blood or blood-tinged mucus.  Chest pain that is often worse with deep breaths.  Rapid or irregular heartbeat.  Feeling light-headed or dizzy.  Fainting.  Feelinganxious.  Sweating. There may also be pain and swelling in a leg if that is where the blood clot started. These symptoms may represent a serious problem that is an emergency. Do not wait to see if the symptoms  will go away. Get medical help right away. Call your local emergency services (911 in the [place] Do not drive yourself to the hospital.  How is this diagnosed? Your health care provider will take a medical history and perform a physical exam. You may also have other tests, including:  Blood tests to assess the clotting properties of your blood, assess oxygen levels in your blood, and find blood clots.  Imaging tests, such as CT, ultrasound, MRI, X-ray, and other tests to see if you have clots anywhere in your body.  An electrocardiogram (ECG) to look for heart strain from blood clots in the lungs. How is this treated? The main goals of PE treatment are:  To stop a blood clot from growing larger.  To stop new blood clots from forming. The type of treatment that you receive depends on many factors, such as the cause of your PE, your risk for bleeding or developing more clots, and other medical conditions that you have. Sometimes, a combination of treatments is necessary. This condition may be treated with:  Medicines, including newer oral blood thinners (anticoagulants), warfarin, low molecular weight heparins, thrombolytics, or heparins.  Wearing compression stockings or using different types of devices.  Surgery (rare) to remove the blood clot or to place a filter in your abdomen to stop the blood clot from traveling to your lungs. Treatments for a PE are often divided into immediate treatment, long-term treatment (up to 3 months after PE), and extended treatment (more than 3 months after PE). Your treatment may continue for several months. This is called maintenance therapy, and it is used to prevent the forming of new blood clots. You can work with your health care provider to choose the treatment program that is best for you. What are anticoagulants?  Anticoagulants are medicines that treat PEs. They can stop current blood clots from growing and stop new clots from forming. They cannot  dissolve existing clots. Your body dissolves clots by itself over time. Anticoagulants are given by mouth, by injection, or through an IV tube. What are thrombolytics?  Thrombolytics are clot-dissolving medicines that are used to dissolve a PE. They carry a high risk of bleeding, so they tend to be used only in severe cases or if you have very low blood pressure. Follow these instructions at home: If you are taking a newer oral anticoagulant:   Take the medicine every single day at the same time each day.  Understand what foods and drugs interact with this medicine.  Understand that there are no regular blood tests required when using this medicine.  Understandthe side effects of this medicine, including excessive bruising or bleeding. Ask your health care provider or pharmacist about other possible side effects. If you are taking warfarin:   Understand how to take warfarin and know which foods can affect how warfarin works in Veterinary surgeon.  Understand that it is dangerous to taketoo much or too little warfarin. Too much warfarin increases the risk of bleeding. Too little warfarin continues to allow the risk for blood clots.  Follow  your PT and INR blood testing schedule. The PT and INR results allow your health care provider to adjust your dose of warfarin. It is very important that you have your PT and INR tested as often as told by your health care provider.  Avoid major changes in your diet, or tell your health care provider before you change your diet. Arrange a visit with a registered dietitian to answer your questions. Many foods, especially foods that are high in vitamin K, can interfere with warfarin and affect the PT and INR results. Eat a consistent amount of foods that are high in vitamin K, such as:  Spinach, kale, broccoli, cabbage, collard greens, turnip greens, Brussels sprouts, peas, cauliflower, seaweed, and parsley.  Beef liver and pork liver.  Green tea.  Soybean  oil.  Tell your health care provider about any and all medicines, vitamins, and supplements that you take, including aspirin and other over-the-counter anti-inflammatory medicines. Be especially cautious with aspirin and anti-inflammatory medicines. Do not take those before you ask your health care provider if it is safe to do so. This is important because many medicines can interfere with warfarin and affect the PT and INR results.  Do not start or stop taking any over-the-counter or prescription medicine unless your health care provider or pharmacist tells you to do so. If you take warfarin, you will also need to do these things:  Hold pressure over cuts for longer than usual.  Tell your dentist and other health care providers that you are taking warfarin before you have any procedures in which bleeding may occur.  Avoid alcohol or drink very small amounts. Tell your health care provider if you change your alcohol intake.  Do not use tobacco products, including cigarettes, chewing tobacco, and e-cigarettes. If you need help quitting, ask your health care provider.  Avoid contact sports. General instructions   Take over-the-counter and prescription medicines only as told by your health care provider. Anticoagulant medicines can have side effects, including easy bruising and difficulty stopping bleeding. If you are prescribed an anticoagulant, you will also need to do these things:  Hold pressure over cuts for longer than usual.  Tell your dentist and other health care providers that you are taking anticoagulants before you have any procedures in which bleeding may occur.  Avoid contact sports.  Wear a medical alert bracelet or carry a medical alert card that says you have had a PE.  Ask your health care provider how soon you can go back to your normal activities. Stay active to prevent new blood clots from forming.  Make sure to exercise while traveling or when you have been sitting or  standing for a long period of time. It is very important to exercise. Exercise your legs by walking or by tightening and relaxing your leg muscles often. Take frequent walks.  Wear compression stockings as told by your health care provider to help prevent more blood clots from forming.  Do not use tobacco products, including cigarettes, chewing tobacco, and e-cigarettes. If you need help quitting, ask your health care provider.  Keep all follow-up appointments with your health care provider. This is important. How is this prevented? Take these actions to decrease your risk of developing another PE:  Exercise regularly. For at least 30 minutes every day, engage in:  Activity that involves moving your arms and legs.  Activity that encourages good blood flow through your body by increasing your heart rate.  Exercise your arms and legs every  hour during long-distance travel (over 4 hours). Drink plenty of water and avoid drinking alcohol while traveling.  Avoid sitting or lying in bed for long periods of time without moving your legs.  Maintain a weight that is appropriate for your height. Ask your health care provider what weight is healthy for you.  If you are a woman who is over 76 years of age, avoid unnecessary use of medicines that contain estrogen. These include birth control pills.  Do not smoke, especially if you take estrogen medicines. If you need help quitting, ask your health care provider.  If you are at very high risk for PE, wear compression stockings.  If you recently had a PE, have regularly scheduled ultrasound testing on your legs to check for new blood clots. If you are hospitalized, prevention measures may include:  Early walking after surgery, as soon as your health care provider says that it is safe.  Receiving anticoagulants to prevent blood clots. If you cannot take anticoagulants, other options may be available, such as wearing compression stockings or using  different types of devices. Get help right away if:  You have new or increased pain, swelling, or redness in an arm or leg.  You have numbness or tingling in an arm or leg.  You have shortness of breath while active or at rest.  You have chest pain.  You have a rapid or irregular heartbeat.  You feel light-headed or dizzy.  You cough up blood.  You notice blood in your vomit, bowel movement, or urine.  You have a fever. These symptoms may represent a serious problem that is an emergency. Do not wait to see if the symptoms will go away. Get medical help right away. Call your local emergency services (911 in the [place] Do not drive yourself to the hospital. This information is not intended to replace advice given to you by your health care provider. Make sure you discuss any questions you have with your health care provider. Document Released: 03/08/2000 Document Revised: 08/17/2015 Document Reviewed: 07/06/2014 Elsevier Interactive Patient Education  2017 [employer].

## 2016-08-06 NOTE — Progress Notes (Signed)
Subjective:    Patient ID: Shannon [name], female    DOB: 07-Mar-1991, 26 y.o.   MRN: [id number]  Chief Complaint  Patient presents with  . Establish Care    swelling in left leg, left foot goes numb, would like to know what to take with elequis and tsh     HPI:  Shannon [name] is a 26 y.o. female who  has a past medical history of Depression; Medical history non-contributory; and Pulmonary thromboembolism ([place]). and presents today for an office visit to establish care.   1.) Pulmonary embolism - Recently evaluated in the ED and admitted to the hospital with the chief complaint of shortness of breath and loss of consciousness. Symptoms gradually worsened over time. CT scan of the chest showed a pulmonary emboli arising from the distal main pulmonary arteries extending into multiple peripheral branches. She was diagnosed with acute pulmonary embolism with heart strain and syncope. She was started on IV heparin infusion in the emergency department with lower extremity Dopplers confirming a DVT. She was on oral contraceptives at the time. She was started on Eliquis.   Since leaving the hospital she reports taking the Eliquis as prescribed and denies adverse side effects or nuisance bleeding. Denies any chest pain, shortness of breath. Does have some swelling in the left leg. Swelling generally comes and goes. Modifying factors include wiping her leg with alcohol and wrapping it with an Ace wrap that seems to help. Swelling is primarily in the left leg.   2.) Fatigue - Associated symptom of fatigue has been going on for several months up to years. Does feel hypersolumelent at times. Has been told she does snore at night. Does use the bathroom twice per evening. Averages about 8 hours of sleep and does not feel well rested. Eating and drinking okay. Menstrual cycles are generally heavy at times lasting about 7 days at a time.    Depression screen [facility] 2/9 08/06/2016  Decreased Interest 0  Down,  Depressed, Hopeless 0  PHQ - 2 Score 0  Altered sleeping 0  Tired, decreased energy 1  Change in appetite 0  Feeling bad or failure about yourself  0  Trouble concentrating 0  Moving slowly or fidgety/restless 0  Suicidal thoughts 0  PHQ-9 Score 1     Allergies  Allergen Reactions  . Bupropion Other (See Comments)    Reaction:  Constipation       Outpatient Medications Prior to Visit  Medication Sig Dispense Refill  . apixaban (ELIQUIS) 5 MG TABS tablet Take 1 tablet (5 mg total) by mouth 2 (two) times daily. Start after finishing 10mg  twice daily tabs 60 tablet 0  . FLUoxetine (PROZAC) 20 MG capsule Take 1 capsule (20 mg total) by mouth daily. 30 capsule 0  . Multiple Vitamin (MULTIVITAMIN WITH MINERALS) TABS tablet Take 1 tablet by mouth daily.    [name] apixaban (ELIQUIS) 5 MG TABS tablet Take 2 tablets (10 mg total) by mouth 2 (two) times daily. 11 tablet 0   No facility-administered medications prior to visit.      Past Medical History:  Diagnosis Date  . Depression   . Medical history non-contributory   . Pulmonary thromboembolism ([place])       Past Surgical History:  Procedure Laterality Date  . NO PAST SURGERIES        Family History  Problem Relation Age of Onset  . Healthy Mother   . Healthy Father   . Pulmonary embolism Neg Hx   .  Deep vein thrombosis Neg Hx       Social History   Social History  . Marital status: Single    Spouse name: N/A  . Number of children: 0  . Years of education: 55   Occupational History  . Associate    Social History Main Topics  . Smoking status: Never Smoker  . Smokeless tobacco: Never Used  . Alcohol use No  . Drug use: No  . Sexual activity: No   Other Topics Concern  . Not on file   Social History Narrative   Fun/Hobby: Going to the gym, writing.      Review of Systems  Constitutional: Positive for fatigue. Negative for chills, fever and unexpected weight change.  Respiratory: Negative for chest  tightness, shortness of breath and wheezing.   Cardiovascular: Positive for leg swelling. Negative for chest pain and palpitations.  Neurological: Negative for dizziness, syncope, weakness and numbness.       Objective:    BP 124/84 (BP Location: Left Arm, Patient Position: Sitting, Cuff Size: Large)   Pulse 84   Temp 98.7 F (37.1 C) (Oral)   Resp 16   Ht 5\' 9"  (1.753 m)   Wt 288 lb (130.6 kg)   SpO2 98%   BMI 42.53 kg/m  Nursing note and vital signs reviewed.  Physical Exam  Constitutional: She is oriented to person, place, and time. She appears well-developed and well-nourished. No distress.  Cardiovascular: Normal rate, regular rhythm, normal heart sounds and intact distal pulses.  Exam reveals no gallop and no friction rub.   No murmur heard. Mild/moderate lower extremity edema with no pitting.   Pulmonary/Chest: Effort normal and breath sounds normal. No respiratory distress. She has no wheezes. She has no rales. She exhibits no tenderness.  Neurological: She is alert and oriented to person, place, and time.  Skin: Skin is warm and dry.  Psychiatric: She has a normal mood and affect. Her behavior is normal. Judgment and thought content normal.        Assessment & Plan:   Problem List Items Addressed This Visit      Cardiovascular and Mediastinum   Pulmonary embolism with acute cor pulmonale ([place])    Pulmonary embolism appears stable with current dosage of Eliquis. No nuisance bleeding, chest pain, shortness of breath, or calf pain. Does have some residual leg swelling on occasion that is relieved with elevation. Continue current dosage of Eliquis and consider referral to hematology.         Other   Fatigue - Primary    Symptoms of fatigue with concern for previous blood work showing microcytosis consistent with iron deficiency. Obtain CBC, IBC panel, and ferritin. Obtain TSH to rule out thyroid dysfunction. Does have some issues with sleep and not feeling well  rested. Cannot rule out exacerbation of depression. Consider sleep study if blood work is negative.      Relevant Orders   CBC (Completed)   IBC panel (Completed)   TSH (Completed)   Ferritin (Completed)       I am having Ms. Mersereau maintain her multivitamin with minerals, apixaban, and FLUoxetine.   Follow-up: Return in about 2 months (around 10/06/2016), or if symptoms worsen or fail to improve.  Mauricio Po, FNP

## 2016-08-06 NOTE — Assessment & Plan Note (Signed)
Pulmonary embolism appears stable with current dosage of Eliquis. No nuisance bleeding, chest pain, shortness of breath, or calf pain. Does have some residual leg swelling on occasion that is relieved with elevation. Continue current dosage of Eliquis and consider referral to hematology.

## 2016-08-07 ENCOUNTER — Encounter: Payer: Self-pay | Admitting: Family

## 2016-08-15 ENCOUNTER — Telehealth: Payer: Self-pay | Admitting: Oncology

## 2016-08-15 ENCOUNTER — Ambulatory Visit (HOSPITAL_BASED_OUTPATIENT_CLINIC_OR_DEPARTMENT_OTHER): Payer: BLUE CROSS/BLUE SHIELD | Admitting: Oncology

## 2016-08-15 VITALS — BP 137/94 | HR 85 | Temp 97.9°F | Resp 18 | Ht 69.0 in | Wt 290.1 lb

## 2016-08-15 DIAGNOSIS — F329 Major depressive disorder, single episode, unspecified: Secondary | ICD-10-CM

## 2016-08-15 DIAGNOSIS — I2609 Other pulmonary embolism with acute cor pulmonale: Secondary | ICD-10-CM | POA: Diagnosis not present

## 2016-08-15 DIAGNOSIS — D473 Essential (hemorrhagic) thrombocythemia: Secondary | ICD-10-CM

## 2016-08-15 DIAGNOSIS — D509 Iron deficiency anemia, unspecified: Secondary | ICD-10-CM

## 2016-08-15 NOTE — Telephone Encounter (Signed)
Gave patient AVS and calender per 5/24 LOS - lab and f/u in 3 months.

## 2016-08-15 NOTE — Progress Notes (Signed)
Reason for Referral: Pulmonary embolism.   HPI: 26 year old originally from [place] but currently attending school at the [employer] [place]. She has history of depression and irregular menstrual cycles and had been on oral contraceptives in attempt to regulate her menstrual cycles. She has been on estrogen-based oral contraceptives for the last 8-9 months. She presented acutely on April 12 with her symptoms of shortness of breath and presyncopal episode. CT scan obtained on 07/04/2016 showed pulmonary emboli from the distal main pulmonary artery extending into the peripheral branches. His heart strain was noted. Patient was started on heparin and no thrombolytics needed. She was discharged on Eliquis on 07/06/2016. She has been taking it without any complications. She has stopped her estrogen-based oral contraceptives. She denied any previous thrombosis episodes. She denied any family history of clots. She denied any provoking symptoms leading up to that episode. She denied any prolonged immobilization.  She does not report any headaches blurry vision, syncope or seizures. She is not reporting any fevers, chills, sweats or weight loss. She does not report any chest pain, palpitation, orthopnea or leg edema. She does not report any cough, wheezing or hemoptysis. She does not report any nausea, vomiting or abdominal pain. She does not report any frequency urgency or hesitancy. She does not report any skeletal complaints. Remaining review of systems unremarkable.   Past Medical History:  Diagnosis Date  . Depression   . Medical history non-contributory   . Pulmonary thromboembolism ([place])   :  Past Surgical History:  Procedure Laterality Date  . NO PAST SURGERIES    :   Current Outpatient Prescriptions:  .  apixaban (ELIQUIS) 5 MG TABS tablet, Take 1 tablet (5 mg total) by mouth 2 (two) times daily. Start after finishing 10mg  twice daily tabs, Disp: 60  tablet, Rfl: 0 .  FLUoxetine (PROZAC) 20 MG capsule, Take 1 capsule (20 mg total) by mouth daily., Disp: 30 capsule, Rfl: 0 .  Multiple Vitamin (MULTIVITAMIN WITH MINERALS) TABS tablet, Take 1 tablet by mouth daily., Disp: , Rfl: :  Allergies  Allergen Reactions  . Bupropion Other (See Comments)    Reaction:  Constipation   :  Family History  Problem Relation Age of Onset  . Healthy Mother   . Healthy Father   . Pulmonary embolism Neg Hx   . Deep vein thrombosis Neg Hx   :  Social History   Social History  . Marital status: Single    Spouse name: N/A  . Number of children: 0  . Years of education: 63   Occupational History  . Associate    Social History Main Topics  . Smoking status: Never Smoker  . Smokeless tobacco: Never Used  . Alcohol use No  . Drug use: No  . Sexual activity: No   Other Topics Concern  . Not on file   Social History Narrative   Fun/Hobby: Going to the gym, writing.  :  Pertinent items are noted in HPI.  Exam: Blood pressure (!) 137/94, pulse 85, temperature 97.9 F (36.6 C), temperature source Oral, resp. rate 18, height 5\' 9"  (1.753 m), weight 290 lb 1.6 oz (131.6 kg), SpO2 100 %.  ECOG 0.  General appearance: alert and cooperative appeared without distress. Throat: No oral thrush or ulcers. Neck: no adenopathy Back: negative Resp: clear to auscultation bilaterally Chest wall: no tenderness Cardio: regular rate and rhythm, S1, S2 normal, no murmur, click, rub or gallop GI: soft, non-tender; bowel sounds  normal; no masses,  no organomegaly Extremities: extremities normal, atraumatic, no cyanosis or edema Pulses: 2+ and symmetric  CBC    Component Value Date/Time   WBC 5.1 08/06/2016 0855   RBC 5.04 08/06/2016 0855   HGB 12.5 08/06/2016 0855   HCT 38.7 08/06/2016 0855   PLT 426.0 (H) 08/06/2016 0855   MCV 76.8 (L) 08/06/2016 0855   MCH 24.5 (L) 07/06/2016 0536   MCHC 32.2 08/06/2016 0855   RDW 15.2 08/06/2016 0855      Chemistry      Component Value Date/Time   NA 139 07/06/2016 0536   K 4.3 07/06/2016 0536   CL 111 07/06/2016 0536   CO2 21 (L) 07/06/2016 0536   BUN 10 07/06/2016 0536   CREATININE 1.02 (H) 07/06/2016 0536      Component Value Date/Time   CALCIUM 8.6 (L) 07/06/2016 0536   ALKPHOS 90 03/30/2016 1457   AST 21 03/30/2016 1457   ALT 13 (L) 03/30/2016 1457   BILITOT 1.0 03/30/2016 1457       Assessment and Plan:    26 year old woman with the following issues:  1. Pulmonary embolism associated with a left deep vein thrombosis. This was diagnosed in April 2018 after she has been on oral contraceptives for the last 8 months. 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 She has been on Eliquis since mid April 2018.  The natural course of acquired and inherited thrombophilia was discussed with the patient today. Her clot appears to be provoked by oral contraceptives and possible and mobility. It is possible also that she could have inherited or acquired thrombophilia that could have contributed to her acute thrombosis.  From a management standpoint, I have recommended full dose anticoagulation to continue for at least 3 months. At that time I will ask her to stop Eliquis and will obtain a hypercoagulable workup at that time to limit interference of Eliquis with the testing. I have also asked her to avoid oral contraceptives at this time given her recent thrombosis episode.  Her risk of further thrombosis and longer anticoagulation will be determined depending on her hypercoagulable panel.  2. Iron deficiency anemia: She is currently on oral iron. Her MCV suggests an element of iron deficiency at this time. I recommended continuing that for the time being.  3. Thrombocytosis: Appears to be reactive related to iron deficiency and possible recent thrombosis episodes.   4. Follow-up: Will be in August after she  completes her hypercoagulable panel to discuss further directions.

## 2016-09-24 ENCOUNTER — Ambulatory Visit (INDEPENDENT_AMBULATORY_CARE_PROVIDER_SITE_OTHER): Payer: BLUE CROSS/BLUE SHIELD | Admitting: Family

## 2016-09-24 ENCOUNTER — Encounter: Payer: Self-pay | Admitting: Family

## 2016-09-24 ENCOUNTER — Other Ambulatory Visit (INDEPENDENT_AMBULATORY_CARE_PROVIDER_SITE_OTHER): Payer: BLUE CROSS/BLUE SHIELD

## 2016-09-24 VITALS — BP 142/92 | HR 88 | Temp 98.7°F | Resp 16 | Ht 69.0 in | Wt 286.0 lb

## 2016-09-24 DIAGNOSIS — I2609 Other pulmonary embolism with acute cor pulmonale: Secondary | ICD-10-CM

## 2016-09-24 DIAGNOSIS — D509 Iron deficiency anemia, unspecified: Secondary | ICD-10-CM | POA: Diagnosis not present

## 2016-09-24 LAB — BASIC METABOLIC PANEL
BUN: 4 mg/dL — AB (ref 6–23)
CHLORIDE: 105 meq/L (ref 96–112)
CO2: 25 mEq/L (ref 19–32)
Calcium: 9.3 mg/dL (ref 8.4–10.5)
Creatinine, Ser: 0.95 mg/dL (ref 0.40–1.20)
GFR: 91 mL/min (ref >60.00)
Glucose, Bld: 97 mg/dL (ref 70–99)
Potassium: 3.4 mEq/L — ABNORMAL LOW (ref 3.5–5.1)
SODIUM: 137 meq/L (ref 135–145)

## 2016-09-24 LAB — FERRITIN: FERRITIN: 20.8 ng/mL (ref 10.0–291.0)

## 2016-09-24 LAB — IBC PANEL
Iron: 105 ug/dL (ref 42–145)
SATURATION RATIOS: 29.4 % (ref 20.0–50.0)
Transferrin: 255 mg/dL (ref 212.0–360.0)

## 2016-09-24 MED ORDER — APIXABAN 5 MG PO TABS: 5.0000 mg | ORAL_TABLET | Freq: Two times a day (BID) | ORAL | 0 refills | Status: DC

## 2016-09-24 NOTE — Assessment & Plan Note (Signed)
Stable and anticoagulated with Eliquis with no adverse side effects or nuisance bleeding. No chest pain, shortness of breath, or calf pain. Continue Eliquis x 1 month then hypercoagulability panel and follow up with hematology.

## 2016-09-24 NOTE — Progress Notes (Signed)
Subjective:    Patient ID: Shannon [name], female    DOB: October 30, 1990, 26 y.o.   MRN: [id number]  Chief Complaint  Patient presents with  . Follow-up    Recheck iron and wants potassium checked, refill of eliquis    HPI:  Shannon [name] is a 26 y.o. female who  has a past medical history of Depression; Medical history non-contributory; and Pulmonary thromboembolism ([place]). and presents today for a follow up office visit.  1.) Pulmonary embolism - Diagnosed with pulmonary embolism in April of 2018 and currently maintained on Eliquis. Reports taking the medication as prescribed and denies adverse side effects or nuisance bleeding. Has been seen by hematology with the recommendation of staying on Eliquis. No chest pain, calf pain or shortness of breath.   2.) Iron deficiency - Currently maintained on ferrous sulfate. Reports taking the medications as prescribed and denies adverse side effects or constipation. Does have a little more energy. Continues to have heavy menstrual cycles. Denies any dizziness or fatigue.  Lab Results  Component Value Date   IRON 105 09/24/2016   FERRITIN 20.8 09/24/2016    Allergies  Allergen Reactions  . Bupropion Other (See Comments)    Reaction:  Constipation, states just the extended release      Outpatient Medications Prior to Visit  Medication Sig Dispense Refill  . Multiple Vitamin (MULTIVITAMIN WITH MINERALS) TABS tablet Take 1 tablet by mouth daily.    [name] apixaban (ELIQUIS) 5 MG TABS tablet Take 1 tablet (5 mg total) by mouth 2 (two) times daily. Start after finishing 10mg  twice daily tabs 60 tablet 0  . FLUoxetine (PROZAC) 20 MG capsule Take 1 capsule (20 mg total) by mouth daily. 30 capsule 0   No facility-administered medications prior to visit.     Review of Systems  Constitutional: Negative for chills and fever.  Respiratory: Negative for chest tightness and shortness of breath.   Cardiovascular: Negative for chest pain, palpitations  and leg swelling.  Neurological: Negative for weakness and numbness.  Hematological: Does not bruise/bleed easily.      Objective:    BP (!) 142/92 (BP Location: Left Arm, Patient Position: Sitting, Cuff Size: Large)   Pulse 88   Temp 98.7 F (37.1 C) (Oral)   Resp 16   Ht 5\' 9"  (1.753 m)   Wt 286 lb (129.7 kg)   SpO2 98%   BMI 42.23 kg/m  Nursing note and vital signs reviewed.  Physical Exam  Constitutional: She is oriented to person, place, and time. She appears well-developed and well-nourished. No distress.  Cardiovascular: Normal rate, regular rhythm, normal heart sounds and intact distal pulses.   Pulmonary/Chest: Effort normal and breath sounds normal.  Musculoskeletal:  No calf tenderness or edema.   Neurological: She is alert and oriented to person, place, and time.  Skin: Skin is warm and dry.  Psychiatric: She has a normal mood and affect. Her behavior is normal. Judgment and thought content normal.       Assessment & Plan:   Problem List Items Addressed This Visit      Cardiovascular and Mediastinum   Pulmonary embolism with acute cor pulmonale (HCC) - Primary    Stable and anticoagulated with Eliquis with no adverse side effects or nuisance bleeding. No chest pain, shortness of breath, or calf pain. Continue Eliquis x 1 month then hypercoagulability panel and follow up with hematology.       Relevant Medications   apixaban (ELIQUIS) 5 MG TABS tablet  Other Relevant Orders   Basic metabolic panel (Completed)     Other   Iron deficiency anemia    Maintained on ferrous sulfate with no adverse side effects. Symptoms are improved. Obtain IBC panel and ferritin. Continue current dosage of ferrous sulfate pending blood work.       Relevant Orders   IBC panel (Completed)   Ferritin (Completed)   Basic metabolic panel (Completed)       I have discontinued Ms. Way's FLUoxetine. I have also changed her apixaban. Additionally, I am having her maintain  her multivitamin with minerals and buPROPion.   Meds ordered this encounter  Medications  . buPROPion (WELLBUTRIN) 100 MG tablet    Sig: Take 100 mg by mouth 2 (two) times daily.  [name] apixaban (ELIQUIS) 5 MG TABS tablet    Sig: Take 1 tablet (5 mg total) by mouth 2 (two) times daily.    Dispense:  60 tablet    Refill:  0    Order Specific Question:   Supervising Provider    Answer:   Pricilla Holm A [[id number]]     Follow-up: Return in about 3 months (around 12/25/2016), or if symptoms worsen or fail to improve.  Mauricio Po, FNP

## 2016-09-24 NOTE — Patient Instructions (Addendum)
Thank you for choosing [facility].  SUMMARY AND INSTRUCTIONS:  Please continue to take your Eliquis as prescribed for 1 more month.   Follow up lab work and office visit with Dr. Alen Blew as scheduled.   We will check your iron today and electrolytes.   Continue to take your iron supplement.   Medication:  Your prescription(s) have been submitted to your pharmacy or been printed and provided for you. Please take as directed and contact our office if you believe you are having problem(s) with the medication(s) or have any questions.  Labs:  Please stop by the lab on the lower level of the building for your blood work. Your results will be released to Leslie (or called to you) after review, usually within 72 hours after test completion. If any changes need to be made, you will be notified at that same time.  1.) The lab is open from 7:30am to 5:30 pm Monday-Friday 2.) No appointment is necessary 3.) Fasting (if needed) is 6-8 hours after food and drink; black coffee and water are okay   Follow up:  If your symptoms worsen or fail to improve, please contact our office for further instruction, or in case of emergency go directly to the emergency room at the closest medical facility.

## 2016-09-24 NOTE — Assessment & Plan Note (Signed)
Maintained on ferrous sulfate with no adverse side effects. Symptoms are improved. Obtain IBC panel and ferritin. Continue current dosage of ferrous sulfate pending blood work.

## 2016-09-25 ENCOUNTER — Encounter: Payer: Self-pay | Admitting: Family

## 2016-10-23 DIAGNOSIS — F332 Major depressive disorder, recurrent severe without psychotic features: Secondary | ICD-10-CM | POA: Diagnosis not present

## 2016-10-31 ENCOUNTER — Other Ambulatory Visit: Payer: Self-pay

## 2016-10-31 DIAGNOSIS — I2609 Other pulmonary embolism with acute cor pulmonale: Secondary | ICD-10-CM

## 2016-11-01 LAB — PROTEIN C ACTIVITY: Protein C-Functional: 120 % (ref 73–180)

## 2016-11-01 LAB — LUPUS ANTICOAGULANT PANEL
DRVVT: 39.4 s (ref 0.0–47.0)
PTT-LA: 35.4 s (ref 0.0–51.9)

## 2016-11-01 LAB — ANTITHROMBIN III: ANTITHROMBIN ACTIVITY: 90 % (ref 75–135)

## 2016-11-01 LAB — PROTEIN S ACTIVITY: Protein S-Functional: 85 % (ref 63–140)

## 2016-11-01 LAB — PROTEIN S, TOTAL: Protein S, Total: 103 % (ref 60–150)

## 2016-11-01 LAB — PROTEIN C, TOTAL: PROTEIN C ANTIGEN: 83 % (ref 60–150)

## 2016-11-02 LAB — BETA-2-GLYCOPROTEIN I ABS, IGG/M/A
Beta-2 Glyco 1 IgA: <9 GPI IgA units (ref 0–25)
Beta-2 Glyco 1 IgM: <9 GPI IgM units (ref 0–32)
Beta-2 Glycoprotein I Ab, IgG: <9 GPI IgG units (ref 0–20)

## 2016-11-02 LAB — CARDIOLIPIN ANTIBODIES, IGG, IGM, IGA
Anticardiolipin Ab,IgA,Qn: <9 APL U/mL (ref 0–11)
Anticardiolipin Ab,IgM,Qn: <9 MPL U/mL (ref 0–12)

## 2016-11-04 LAB — PROTHROMBIN GENE MUTATION

## 2016-11-07 LAB — FACTOR 5 LEIDEN

## 2016-11-14 ENCOUNTER — Ambulatory Visit (HOSPITAL_BASED_OUTPATIENT_CLINIC_OR_DEPARTMENT_OTHER): Payer: BLUE CROSS/BLUE SHIELD | Admitting: Oncology

## 2016-11-14 VITALS — BP 148/91 | HR 79 | Temp 98.7°F | Resp 18 | Ht 69.0 in | Wt 283.1 lb

## 2016-11-14 DIAGNOSIS — I2609 Other pulmonary embolism with acute cor pulmonale: Secondary | ICD-10-CM | POA: Diagnosis not present

## 2016-11-14 DIAGNOSIS — I8290 Acute embolism and thrombosis of unspecified vein: Secondary | ICD-10-CM

## 2016-11-14 NOTE — Progress Notes (Signed)
Hematology and Oncology Follow Up Visit  Shannon [name] [id number] Nov 28, 1990 26 y.o. 11/14/2016 9:05 AM Shannon [name] Oletha Blend, Ples Specter, FNP   Principle Diagnosis: 26 year old woman with pulmonary embolism and left deep vein thrombosis diagnosed in April 2018. This episode occurred while on oral contraceptives.   Prior Therapy: Eliquis full dose anticoagulation between April 2018 in August 2018.  Current therapy: Off anticoagulation at this time.  Interim History: Shannon [name] presents today for a follow-up visit. Since the last visit, she reports feeling reasonably well without any recent complaints. She is off anticoagulation at this time and feels better. She does report some pain in her left leg which aches at times but otherwise no other complaints. She is able to work full time and attends school. She denied any bleeding or thrombosis episodes since her initial diagnosis. She denied any hematochezia or melena.  She does not report any headaches blurry vision, syncope or seizures. She is not reporting any fevers, chills, sweats or weight loss. She does not report any chest pain, palpitation, orthopnea or leg edema. She does not report any cough, wheezing or hemoptysis. She does not report any nausea, vomiting or abdominal pain. She does not report any frequency urgency or hesitancy. She does not report any skeletal complaints. Remaining review of systems unremarkable.   Medications: I have reviewed the patient's current medications.  Current Outpatient Prescriptions  Medication Sig Dispense Refill  . apixaban (ELIQUIS) 5 MG TABS tablet Take 1 tablet (5 mg total) by mouth 2 (two) times daily. 60 tablet 0  . buPROPion (WELLBUTRIN) 100 MG tablet Take 100 mg by mouth 2 (two) times daily.    . Multiple Vitamin (MULTIVITAMIN WITH MINERALS) TABS tablet Take 1 tablet by mouth daily.     No current facility-administered medications for this visit.      Allergies:  Allergies  Allergen  Reactions  . Bupropion Other (See Comments)    Reaction:  Constipation, states just the extended release    Past Medical History, Surgical history, Social history, and Family History were reviewed and updated.  Physical Exam: Blood pressure (!) 148/91, pulse 79, temperature 98.7 F (37.1 C), temperature source Oral, resp. rate 18, height 5\' 9"  (1.753 m), weight 283 lb 1.6 oz (128.4 kg), SpO2 100 %. ECOG: 0 General appearance: alert and cooperative Head: Normocephalic, without obvious abnormality Neck: no adenopathy Lymph nodes: Cervical, supraclavicular, and axillary nodes normal. Heart:regular rate and rhythm, S1, S2 normal, no murmur, click, rub or gallop Lung:chest clear, no wheezing, rales, normal symmetric air entry Abdomin: soft, non-tender, without masses or organomegaly EXT:no erythema, induration, or nodules   Lab Results: Lab Results  Component Value Date   WBC 5.1 08/06/2016   HGB 12.5 08/06/2016   HCT 38.7 08/06/2016   MCV 76.8 (L) 08/06/2016   PLT 426.0 (H) 08/06/2016     Chemistry      Component Value Date/Time   NA 137 09/24/2016 1354   K 3.4 (L) 09/24/2016 1354   CL 105 09/24/2016 1354   CO2 25 09/24/2016 1354   BUN 4 (L) 09/24/2016 1354   CREATININE 0.95 09/24/2016 1354      Component Value Date/Time   CALCIUM 9.3 09/24/2016 1354   ALKPHOS 90 03/30/2016 1457   AST 21 03/30/2016 1457   ALT 13 (L) 03/30/2016 1457   BILITOT 1.0 03/30/2016 1457      Impression and Plan:  26 year old woman with the following issues:  1. Pulmonary embolism associated with a left deep vein  thrombosis. This was diagnosed in April 2018 after she has been on oral contraceptives for the last 8 months. 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 She has been on Eliquis since mid April 2018.   Her hypercoagulable workup was reviewed today and showed no evidence of inherited or acquired  thrombophilia.  Risks and benefits of continuing anticoagulation was reviewed today given the fact that she completed more than 3 months of anticoagulation for a provoked deep vein thrombosis she is off anticoagulation at this time. She understands if she develops recurrent thrombosis in the future warmer anticoagulation might be needed.  We have discussed strategies today to mitigate her risk of developing recurrent deep vein thrombosis. She is at a lower risk because her thrombosis was provoked by external factors that has been removed including oral contraceptives. I encouraged her to use different method of birth control other than hormone-based. We have discussed also increased mobility and ovoid immobilization for an extended period of time. We also discussed signs or symptoms of recurrence and points to report to healthcare professionals she develops any of these symptoms.   2. Follow-up: In happy to see her in the future as needed.     Zola Button, MD 8/23/20189:05 AM

## 2016-12-25 DIAGNOSIS — F332 Major depressive disorder, recurrent severe without psychotic features: Secondary | ICD-10-CM | POA: Diagnosis not present

## 2016-12-30 ENCOUNTER — Ambulatory Visit (INDEPENDENT_AMBULATORY_CARE_PROVIDER_SITE_OTHER): Payer: BLUE CROSS/BLUE SHIELD | Admitting: Family

## 2016-12-30 ENCOUNTER — Encounter: Payer: Self-pay | Admitting: Family

## 2016-12-30 ENCOUNTER — Other Ambulatory Visit (INDEPENDENT_AMBULATORY_CARE_PROVIDER_SITE_OTHER): Payer: Self-pay

## 2016-12-30 VITALS — BP 142/94 | HR 90 | Temp 99.1°F | Resp 16 | Ht 69.0 in | Wt 285.0 lb

## 2016-12-30 DIAGNOSIS — I10 Essential (primary) hypertension: Secondary | ICD-10-CM | POA: Diagnosis not present

## 2016-12-30 DIAGNOSIS — D509 Iron deficiency anemia, unspecified: Secondary | ICD-10-CM | POA: Diagnosis not present

## 2016-12-30 DIAGNOSIS — R4781 Slurred speech: Secondary | ICD-10-CM | POA: Diagnosis not present

## 2016-12-30 LAB — CBC
HCT: 41.3 % (ref 36.0–46.0)
Hemoglobin: 13.2 g/dL (ref 12.0–15.0)
MCHC: 31.9 g/dL (ref 30.0–36.0)
MCV: 82.2 fl (ref 78.0–100.0)
PLATELETS: 361 10*3/uL (ref 150.0–400.0)
RBC: 5.02 Mil/uL (ref 3.87–5.11)
RDW: 13.3 % (ref 11.5–15.5)
WBC: 4.7 10*3/uL (ref 4.0–10.5)

## 2016-12-30 LAB — COMPREHENSIVE METABOLIC PANEL
ALBUMIN: 4.2 g/dL (ref 3.5–5.2)
ALT: 10 U/L (ref 0–35)
AST: 13 U/L (ref 0–37)
Alkaline Phosphatase: 107 U/L (ref 39–117)
BUN: 6 mg/dL (ref 6–23)
CALCIUM: 9.5 mg/dL (ref 8.4–10.5)
CHLORIDE: 103 meq/L (ref 96–112)
CO2: 27 meq/L (ref 19–32)
Creatinine, Ser: 0.95 mg/dL (ref 0.40–1.20)
GFR: 90.82 mL/min (ref >60.00)
Glucose, Bld: 89 mg/dL (ref 70–99)
POTASSIUM: 3.8 meq/L (ref 3.5–5.1)
Sodium: 137 mEq/L (ref 135–145)
Total Bilirubin: 0.6 mg/dL (ref 0.2–1.2)
Total Protein: 7.8 g/dL (ref 6.0–8.3)

## 2016-12-30 LAB — FERRITIN: Ferritin: 18.4 ng/mL (ref 10.0–291.0)

## 2016-12-30 LAB — IBC PANEL
IRON: 68 ug/dL (ref 42–145)
SATURATION RATIOS: 18.9 % — AB (ref 20.0–50.0)
TRANSFERRIN: 257 mg/dL (ref 212.0–360.0)

## 2016-12-30 MED ORDER — HYDROCHLOROTHIAZIDE 12.5 MG PO CAPS: 12.5000 mg | ORAL_CAPSULE | Freq: Every day | ORAL | 1 refills | Status: DC

## 2016-12-30 NOTE — Patient Instructions (Addendum)
Thank you for choosing [facility].  SUMMARY AND INSTRUCTIONS:  Please continue to take your iron.  Start taking your hydrochlorothiaizide   Monitor your blood pressure at home and follow a low sodium diet.   Medication:  Your prescription(s) have been submitted to your pharmacy or been printed and provided for you. Please take as directed and contact our office if you believe you are having problem(s) with the medication(s) or have any questions.   Labs:  Please stop by the lab on the lower level of the building for your blood work. Your results will be released to [place] (or called to you) after review, usually within 72 hours after test completion. If any changes need to be made, you will be notified at that same time.  1.) The lab is open from 7:30am to 5:30 pm Monday-Friday 2.) No appointment is necessary 3.) Fasting (if needed) is 6-8 hours after food and drink; black coffee and water are okay   Follow up:  If your symptoms worsen or fail to improve, please contact our office for further instruction, or in case of emergency go directly to the emergency room at the closest medical facility.    Hypertension Hypertension is another name for high blood pressure. High blood pressure forces your heart to work harder to pump blood. This can cause problems over time. There are two numbers in a blood pressure reading. There is a top number (systolic) over a bottom number (diastolic). It is best to have a blood pressure below 120/80. Healthy choices can help lower your blood pressure. You may need medicine to help lower your blood pressure if:  Your blood pressure cannot be lowered with healthy choices.  Your blood pressure is higher than 130/80.  Follow these instructions at home: Eating and drinking  If directed, follow the DASH eating plan. This diet includes: ? Filling half of your plate at each meal with fruits and vegetables. ? Filling one quarter of your plate at  each meal with whole grains. Whole grains include whole wheat pasta, brown rice, and whole grain bread. ? Eating or drinking low-fat dairy products, such as skim milk or low-fat yogurt. ? Filling one quarter of your plate at each meal with low-fat (lean) proteins. Low-fat proteins include fish, skinless chicken, eggs, beans, and tofu. ? Avoiding fatty meat, cured and processed meat, or chicken with skin. ? Avoiding premade or processed food.  Eat less than 1,500 mg of salt (sodium) a day.  Limit alcohol use to no more than 1 drink a day for nonpregnant women and 2 drinks a day for men. One drink equals 12 oz of beer, 5 oz of wine, or 1 oz of hard liquor. Lifestyle  Work with your doctor to stay at a healthy weight or to lose weight. Ask your doctor what the best weight is for you.  Get at least 30 minutes of exercise that causes your heart to beat faster (aerobic exercise) most days of the week. This may include walking, swimming, or biking.  Get at least 30 minutes of exercise that strengthens your muscles (resistance exercise) at least 3 days a week. This may include lifting weights or pilates.  Do not use any products that contain nicotine or tobacco. This includes cigarettes and e-cigarettes. If you need help quitting, ask your doctor.  Check your blood pressure at home as told by your doctor.  Keep all follow-up visits as told by your doctor. This is important. Medicines  Take over-the-counter and  prescription medicines only as told by your doctor. Follow directions carefully.  Do not skip doses of blood pressure medicine. The medicine does not work as well if you skip doses. Skipping doses also puts you at risk for problems.  Ask your doctor about side effects or reactions to medicines that you should watch for. Contact a doctor if:  You think you are having a reaction to the medicine you are taking.  You have headaches that keep coming back (recurring).  You feel  dizzy.  You have swelling in your ankles.  You have trouble with your vision. Get help right away if:  You get a very bad headache.  You start to feel confused.  You feel weak or numb.  You feel faint.  You get very bad pain in your: ? Chest. ? Belly (abdomen).  You throw up (vomit) more than once.  You have trouble breathing. Summary  Hypertension is another name for high blood pressure.  Making healthy choices can help lower blood pressure. If your blood pressure cannot be controlled with healthy choices, you may need to take medicine. This information is not intended to replace advice given to you by your health care provider. Make sure you discuss any questions you have with your health care provider. Document Released: 08/28/2007 Document Revised: 02/07/2016 Document Reviewed: 02/07/2016 Elsevier Interactive Patient Education  2018 [employer] Eating Plan DASH stands for "Dietary Approaches to Stop Hypertension." The DASH eating plan is a healthy eating plan that has been shown to reduce high blood pressure (hypertension). It may also reduce your risk for type 2 diabetes, heart disease, and stroke. The DASH eating plan may also help with weight loss. What are tips for following this plan? General guidelines  Avoid eating more than 2,300 mg (milligrams) of salt (sodium) a day. If you have hypertension, you may need to reduce your sodium intake to 1,500 mg a day.  Limit alcohol intake to no more than 1 drink a day for nonpregnant women and 2 drinks a day for men. One drink equals 12 oz of beer, 5 oz of wine, or 1 oz of hard liquor.  Work with your health care provider to maintain a healthy body weight or to lose weight. Ask what an ideal weight is for you.  Get at least 30 minutes of exercise that causes your heart to beat faster (aerobic exercise) most days of the week. Activities may include walking, swimming, or biking.  Work with your health care  provider or diet and nutrition specialist (dietitian) to adjust your eating plan to your individual calorie needs. Reading food labels  Check food labels for the amount of sodium per serving. Choose foods with less than 5 percent of the Daily Value of sodium. Generally, foods with less than 300 mg of sodium per serving fit into this eating plan.  To find whole grains, look for the word "whole" as the first word in the ingredient list. Shopping  Buy products labeled as "low-sodium" or "no salt added."  Buy fresh foods. Avoid canned foods and premade or frozen meals. Cooking  Avoid adding salt when cooking. Use salt-free seasonings or herbs instead of table salt or sea salt. Check with your health care provider or pharmacist before using salt substitutes.  Do not fry foods. Cook foods using healthy methods such as baking, boiling, grilling, and broiling instead.  Cook with heart-healthy oils, such as olive, canola, soybean, or sunflower oil. Meal planning   Eat  a balanced diet that includes: ? 5 or more servings of fruits and vegetables each day. At each meal, try to fill half of your plate with fruits and vegetables. ? Up to 6-8 servings of whole grains each day. ? Less than 6 oz of lean meat, poultry, or fish each day. A 3-oz serving of meat is about the same size as a deck of cards. One egg equals 1 oz. ? 2 servings of low-fat dairy each day. ? A serving of nuts, seeds, or beans 5 times each week. ? Heart-healthy fats. Healthy fats called Omega-3 fatty acids are found in foods such as flaxseeds and coldwater fish, like sardines, salmon, and mackerel.  Limit how much you eat of the following: ? Canned or prepackaged foods. ? Food that is high in trans fat, such as fried foods. ? Food that is high in saturated fat, such as fatty meat. ? Sweets, desserts, sugary drinks, and other foods with added sugar. ? Full-fat dairy products.  Do not salt foods before eating.  Try to eat at  least 2 vegetarian meals each week.  Eat more home-cooked food and less restaurant, buffet, and fast food.  When eating at a restaurant, ask that your food be prepared with less salt or no salt, if possible. What foods are recommended? The items listed may not be a complete list. Talk with your dietitian about what dietary choices are best for you. Grains Whole-grain or whole-wheat bread. Whole-grain or whole-wheat pasta. Brown rice. [place]. Bulgur. Whole-grain and low-sodium cereals. Pita bread. Low-fat, low-sodium crackers. Whole-wheat flour tortillas. Vegetables Fresh or frozen vegetables (raw, steamed, roasted, or grilled). Low-sodium or reduced-sodium tomato and vegetable juice. Low-sodium or reduced-sodium tomato sauce and tomato paste. Low-sodium or reduced-sodium canned vegetables. Fruits All fresh, dried, or frozen fruit. Canned fruit in natural juice (without added sugar). Meat and other protein foods Skinless chicken or [place]. Ground chicken or [place]. Pork with fat trimmed off. Fish and seafood. Egg whites. Dried beans, peas, or lentils. Unsalted nuts, nut butters, and seeds. Unsalted canned beans. Lean cuts of beef with fat trimmed off. Low-sodium, lean deli meat. Dairy Low-fat (1%) or fat-free (skim) milk. Fat-free, low-fat, or reduced-fat cheeses. Nonfat, low-sodium ricotta or cottage cheese. Low-fat or nonfat yogurt. Low-fat, low-sodium cheese. Fats and oils Soft margarine without trans fats. Vegetable oil. Low-fat, reduced-fat, or light mayonnaise and salad dressings (reduced-sodium). Canola, safflower, olive, soybean, and sunflower oils. Avocado. Seasoning and other foods Herbs. Spices. Seasoning mixes without salt. Unsalted popcorn and pretzels. Fat-free sweets. What foods are not recommended? The items listed may not be a complete list. Talk with your dietitian about what dietary choices are best for you. Grains Baked goods made with fat, such as croissants,  muffins, or some breads. Dry pasta or rice meal packs. Vegetables Creamed or fried vegetables. Vegetables in a cheese sauce. Regular canned vegetables (not low-sodium or reduced-sodium). Regular canned tomato sauce and paste (not low-sodium or reduced-sodium). Regular tomato and vegetable juice (not low-sodium or reduced-sodium). Angie Fava. Olives. Fruits Canned fruit in a light or heavy syrup. Fried fruit. Fruit in cream or butter sauce. Meat and other protein foods Fatty cuts of meat. Ribs. Fried meat. Berniece Salines. Sausage. Bologna and other processed lunch meats. Salami. Fatback. Hotdogs. Bratwurst. Salted nuts and seeds. Canned beans with added salt. Canned or smoked fish. Whole eggs or egg yolks. Chicken or [place] with skin. Dairy Whole or 2% milk, cream, and half-and-half. Whole or full-fat cream cheese. Whole-fat or sweetened yogurt. Full-fat  cheese. Nondairy creamers. Whipped toppings. Processed cheese and cheese spreads. Fats and oils Butter. Stick margarine. Lard. Shortening. Ghee. Bacon fat. Tropical oils, such as coconut, palm kernel, or palm oil. Seasoning and other foods Salted popcorn and pretzels. Onion salt, garlic salt, seasoned salt, table salt, and sea salt. Worcestershire sauce. Tartar sauce. Barbecue sauce. Teriyaki sauce. Soy sauce, including reduced-sodium. Steak sauce. Canned and packaged gravies. Fish sauce. Oyster sauce. Cocktail sauce. Horseradish that you find on the shelf. Ketchup. Mustard. Meat flavorings and tenderizers. Bouillon cubes. Hot sauce and Tabasco sauce. Premade or packaged marinades. Premade or packaged taco seasonings. Relishes. Regular salad dressings. Where to find more information:  National Heart, Lung, and [place]: https://wilson-eaton.com/  American Heart Association: www.heart.org Summary  The DASH eating plan is a healthy eating plan that has been shown to reduce high blood pressure (hypertension). It may also reduce your risk for type 2 diabetes,  heart disease, and stroke.  With the DASH eating plan, you should limit salt (sodium) intake to 2,300 mg a day. If you have hypertension, you may need to reduce your sodium intake to 1,500 mg a day.  When on the DASH eating plan, aim to eat more fresh fruits and vegetables, whole grains, lean proteins, low-fat dairy, and heart-healthy fats.  Work with your health care provider or diet and nutrition specialist (dietitian) to adjust your eating plan to your individual calorie needs. This information is not intended to replace advice given to you by your health care provider. Make sure you discuss any questions you have with your health care provider. Document Released: 02/28/2011 Document Revised: 03/04/2016 Document Reviewed: 03/04/2016 Elsevier Interactive Patient Education  2017 [employer].

## 2016-12-30 NOTE — Assessment & Plan Note (Signed)
Iron deficiency anemia likely associated with heavy menstrual cycle currently maintained on iron with no adverse side effects. Obtain CBC, ferritin, and IBC panel. Continue current dosage of iron supplementation pending blood work results.

## 2016-12-30 NOTE — Assessment & Plan Note (Signed)
Multiple elevated blood pressure readings above 140/90 with lifestyle management and previous family history of hypertension. Start hydrochlorothiazide. Encouraged to monitor blood pressure at home and follow low-sodium diet. Information on Dash eating plan provided. Follow-up in one month or sooner to recheck blood pressure.

## 2016-12-30 NOTE — Assessment & Plan Note (Signed)
No evidence of slurred speech noted with normal neurological exam. Unlikely stroke or cardiac related event given age and previous anticoagulation secondary to pulmonary embolism. Continue to monitor and consider additional imaging if symptoms return.

## 2016-12-30 NOTE — Progress Notes (Signed)
Subjective:    Patient ID: Shannon [name], female    DOB: 1990/07/26, 26 y.o.   MRN: [id number]  Chief Complaint  Patient presents with  . Follow-up    Would like iron level and BP checked, feels like she occasionally has slurred speech    HPI:  Shannon [name] is a 26 y.o. female who  has a past medical history of Depression; Medical history non-contributory; and Pulmonary thromboembolism ([place]). and presents today for a follow up office visit.  1.) Iron deficiency anemia - Currently maintained on oral OTC iron with no adverse side effects. Reports taking as prescribed. No constipation. Continues to have heavy menstural cycles. Denies shortness of breath or fatigue.  2.) Elevated blood pressure - Noted to have elevated blood pressures on several occasions with readings above 140/90. Not currently maintained on medication. Does endorse increased sodium intake. Denies worst headache of life with no changes to vision.  BP Readings from Last 3 Encounters:  12/30/16 (!) 142/94  11/14/16 (!) 148/91  09/24/16 (!) 142/92    3.) Slurred speech - Occasionally noted to have slurred speech that occurs a few times a week. Goes away within a few seconds of onset. Denies hemiparesis, hemiparalysis, aphasias or apraxias. No head injury. Has concern as she has family history of stroke and heart disease.   Allergies  Allergen Reactions  . Bupropion Other (See Comments)    Reaction:  Constipation, states just the extended release      Outpatient Medications Prior to Visit  Medication Sig Dispense Refill  . buPROPion (WELLBUTRIN) 100 MG tablet Take 100 mg by mouth 2 (two) times daily.    . Multiple Vitamin (MULTIVITAMIN WITH MINERALS) TABS tablet Take 1 tablet by mouth daily.    [name] apixaban (ELIQUIS) 5 MG TABS tablet Take 1 tablet (5 mg total) by mouth 2 (two) times daily. 60 tablet 0   No facility-administered medications prior to visit.       Past Surgical History:  Procedure Laterality  Date  . NO PAST SURGERIES        Past Medical History:  Diagnosis Date  . Depression   . Medical history non-contributory   . Pulmonary thromboembolism ([place])       Review of Systems  Constitutional: Negative for chills, fatigue, fever and unexpected weight change.  Eyes:       Negative for changes in vision  Respiratory: Negative for cough, chest tightness, shortness of breath and wheezing.   Cardiovascular: Negative for chest pain, palpitations and leg swelling.  Skin: Negative for pallor.  Neurological: Negative for dizziness, weakness and light-headedness.      Objective:    BP (!) 142/94 (BP Location: Left Arm, Patient Position: Sitting, Cuff Size: Large)   Pulse 90   Temp 99.1 F (37.3 C) (Oral)   Resp 16   Ht '5\' 9"'$  (1.753 m)   Wt 285 lb (129.3 kg)   SpO2 96%   BMI 42.09 kg/m  Nursing note and vital signs reviewed.  Physical Exam  Constitutional: She is oriented to person, place, and time. She appears well-developed and well-nourished. No distress.  Obese  Eyes: Pupils are equal, round, and reactive to light. Conjunctivae and EOM are normal.  Cardiovascular: Normal rate, regular rhythm, normal heart sounds and intact distal pulses.  Exam reveals no gallop and no friction rub.   No murmur heard. Pulmonary/Chest: Effort normal and breath sounds normal. No respiratory distress. She has no wheezes. She has no rales. She exhibits no  tenderness.  Neurological: She is alert and oriented to person, place, and time. No cranial nerve deficit.  Skin: Skin is warm and dry.  Psychiatric: She has a normal mood and affect. Her behavior is normal. Judgment and thought content normal.       Assessment & Plan:   Problem List Items Addressed This Visit      Cardiovascular and Mediastinum   Hypertension    Multiple elevated blood pressure readings above 140/90 with lifestyle management and previous family history of hypertension. Start hydrochlorothiazide. Encouraged to  monitor blood pressure at home and follow low-sodium diet. Information on Dash eating plan provided. Follow-up in one month or sooner to recheck blood pressure.      Relevant Medications   hydrochlorothiazide (MICROZIDE) 12.5 MG capsule   Other Relevant Orders   Comp Met (CMET) (Completed)     Other   Iron deficiency anemia - Primary    Iron deficiency anemia likely associated with heavy menstrual cycle currently maintained on iron with no adverse side effects. Obtain CBC, ferritin, and IBC panel. Continue current dosage of iron supplementation pending blood work results.      Relevant Orders   CBC (Completed)   IBC panel (Completed)   Ferritin (Completed)   Slurred speech    No evidence of slurred speech noted with normal neurological exam. Unlikely stroke or cardiac related event given age and previous anticoagulation secondary to pulmonary embolism. Continue to monitor and consider additional imaging if symptoms return.          I have discontinued Ms. Worden's apixaban. I am also having her start on hydrochlorothiazide. Additionally, I am having her maintain her multivitamin with minerals and buPROPion.   Meds ordered this encounter  Medications  . hydrochlorothiazide (MICROZIDE) 12.5 MG capsule    Sig: Take 1 capsule (12.5 mg total) by mouth daily.    Dispense:  30 capsule    Refill:  1    Order Specific Question:   Supervising Provider    Answer:   Pricilla Holm A [[id number]]     Follow-up: Return in about 1 month (around 01/30/2017), or if symptoms worsen or fail to improve.  Mauricio Po, FNP

## 2017-02-26 DIAGNOSIS — F332 Major depressive disorder, recurrent severe without psychotic features: Secondary | ICD-10-CM | POA: Diagnosis not present

## 2017-03-26 DIAGNOSIS — F3341 Major depressive disorder, recurrent, in partial remission: Secondary | ICD-10-CM | POA: Diagnosis not present

## 2017-04-02 DIAGNOSIS — F3341 Major depressive disorder, recurrent, in partial remission: Secondary | ICD-10-CM | POA: Diagnosis not present

## 2017-04-09 DIAGNOSIS — F3341 Major depressive disorder, recurrent, in partial remission: Secondary | ICD-10-CM | POA: Diagnosis not present

## 2017-04-16 DIAGNOSIS — F3341 Major depressive disorder, recurrent, in partial remission: Secondary | ICD-10-CM | POA: Diagnosis not present

## 2017-04-23 DIAGNOSIS — F3341 Major depressive disorder, recurrent, in partial remission: Secondary | ICD-10-CM | POA: Diagnosis not present

## 2017-04-30 DIAGNOSIS — F3341 Major depressive disorder, recurrent, in partial remission: Secondary | ICD-10-CM | POA: Diagnosis not present

## 2017-05-01 DIAGNOSIS — F332 Major depressive disorder, recurrent severe without psychotic features: Secondary | ICD-10-CM | POA: Diagnosis not present

## 2017-05-06 NOTE — Progress Notes (Signed)
Subjective:    Patient ID: Shannon [name], female    DOB: [birth year], 27 y.o.   MRN: [id number]  HPI The patient is here for follow up.  She will be establishing with her new pcp in the near future.   depression:  She is following with a psychiatrist.  She is taking her medication daily as prescribed.  She did have QT prolongation with the previous antidepressant and has been switched to Wellbutrin.  She feels it is working well, but her psychiatrist is concerned about her causing any heart issues and wanted that to be evaluated.  She denies any side effects including chest pain, palpitations and shortness of breath.   Hypertension: She is taking her medication daily. She is not compliant with a low sodium diet.  She denies chest pain, palpitations, edema, shortness of breath and regular headaches. She is not exercising regularly.  She does not monitor her blood pressure at home.       Medications and allergies reviewed with patient and updated if appropriate.  Patient Active Problem List   Diagnosis Date Noted  . Hypertension 12/30/2016  . Slurred speech 12/30/2016  . Iron deficiency anemia 09/24/2016  . Fatigue 08/06/2016  . Pulmonary embolism with acute cor pulmonale ([place]) 07/04/2016  . Hypokalemia 07/04/2016  . Prolonged QT interval 07/04/2016  . Depression 07/04/2016  . Adjustment disorder with emotional disturbance 03/31/2016  . Major depressive disorder, recurrent severe without psychotic features ([place]) 01/10/2016  . Major depressive disorder, recurrent episode, mild ([place]) 01/01/2016  . Suicide ideation 02/16/2013    Current Outpatient Medications on File Prior to Visit  Medication Sig Dispense Refill  . buPROPion (WELLBUTRIN) 100 MG tablet Take 100 mg by mouth 2 (two) times daily.    . hydrochlorothiazide (MICROZIDE) 12.5 MG capsule Take 1 capsule (12.5 mg total) by mouth daily. 30 capsule 1  . Multiple Vitamin (MULTIVITAMIN WITH MINERALS) TABS tablet Take 1 tablet  by mouth daily.     No current facility-administered medications on file prior to visit.     Past Medical History:  Diagnosis Date  . Depression   . Medical history non-contributory   . Pulmonary thromboembolism ([place])     Past Surgical History:  Procedure Laterality Date  . NO PAST SURGERIES      Social History   Socioeconomic History  . Marital status: Single    Spouse name: None  . Number of children: 0  . Years of education: 24  . Highest education level: None  Social Needs  . Financial resource strain: None  . Food insecurity - worry: None  . Food insecurity - inability: None  . Transportation needs - medical: None  . Transportation needs - non-medical: None  Occupational History  . Occupation: Associate  Tobacco Use  . Smoking status: Never Smoker  . Smokeless tobacco: Never Used  Substance and Sexual Activity  . Alcohol use: No  . Drug use: No  . Sexual activity: No    Birth control/protection: None  Other Topics Concern  . None  Social History Narrative   Fun/Hobby: Going to the gym, writing.    Family History  Problem Relation Age of Onset  . Healthy Mother   . Healthy Father   . Pulmonary embolism Neg Hx   . Deep vein thrombosis Neg Hx     Review of Systems  Constitutional: Negative for chills and fever.  Respiratory: Negative for cough, shortness of breath and wheezing.   Cardiovascular: Negative for chest pain,  palpitations and leg swelling.  Neurological: Negative for light-headedness and headaches.       Objective:   Vitals:   05/07/17 1509  BP: (!) 134/104  Pulse: 92  Resp: 16  Temp: 98.8 F (37.1 C)  SpO2: 97%   Wt Readings from Last 3 Encounters:  05/07/17 281 lb (127.5 kg)  12/30/16 285 lb (129.3 kg)  11/14/16 283 lb 1.6 oz (128.4 kg)   Body mass index is 41.5 kg/m.   Physical Exam    Constitutional: Appears well-developed and well-nourished. No distress.  HENT:  Head: Normocephalic and atraumatic.  Neck: Neck  supple. No tracheal deviation present. No thyromegaly present.  No cervical lymphadenopathy Cardiovascular: Normal rate, regular rhythm and normal heart sounds.   No murmur heard. No carotid bruit .  No edema Pulmonary/Chest: Effort normal and breath sounds normal. No respiratory distress. No has no wheezes. No rales.  Skin: Skin is warm and dry. Not diaphoretic.  Psychiatric: Normal mood and affect. Behavior is normal.      Assessment & Plan:    See Problem List for Assessment and Plan of chronic medical problems.

## 2017-05-07 ENCOUNTER — Ambulatory Visit: Payer: BLUE CROSS/BLUE SHIELD | Admitting: Internal Medicine

## 2017-05-07 ENCOUNTER — Encounter: Payer: Self-pay | Admitting: Internal Medicine

## 2017-05-07 VITALS — BP 134/104 | HR 92 | Temp 98.8°F | Resp 16 | Wt 281.0 lb

## 2017-05-07 DIAGNOSIS — R9431 Abnormal electrocardiogram [ECG] [EKG]: Secondary | ICD-10-CM | POA: Diagnosis not present

## 2017-05-07 DIAGNOSIS — I10 Essential (primary) hypertension: Secondary | ICD-10-CM | POA: Diagnosis not present

## 2017-05-07 DIAGNOSIS — F329 Major depressive disorder, single episode, unspecified: Secondary | ICD-10-CM

## 2017-05-07 DIAGNOSIS — F32A Depression, unspecified: Secondary | ICD-10-CM

## 2017-05-07 DIAGNOSIS — F3341 Major depressive disorder, recurrent, in partial remission: Secondary | ICD-10-CM | POA: Diagnosis not present

## 2017-05-07 MED ORDER — LOSARTAN POTASSIUM 25 MG PO TABS: 25.0000 mg | ORAL_TABLET | Freq: Every day | ORAL | 5 refills | Status: DC

## 2017-05-07 MED ORDER — IRON 325 (65 FE) MG PO TABS: ORAL_TABLET | ORAL | 0 refills | Status: DC

## 2017-05-07 NOTE — Patient Instructions (Signed)
You had an EKG today.      Start losartan 25 mg daily in addition to the hctz 12. 5 mg daily for your blood pressure.    Work on decreasing your salt/sodium intake, start regular exercise and work on weight loss.     Hypertension Hypertension, commonly called high blood pressure, is when the force of blood pumping through the arteries is too strong. The arteries are the blood vessels that carry blood from the heart throughout the body. Hypertension forces the heart to work harder to pump blood and may cause arteries to become narrow or stiff. Having untreated or uncontrolled hypertension can cause heart attacks, strokes, kidney disease, and other problems. A blood pressure reading consists of a higher number over a lower number. Ideally, your blood pressure should be below 120/80. The first ("top") number is called the systolic pressure. It is a measure of the pressure in your arteries as your heart beats. The second ("bottom") number is called the diastolic pressure. It is a measure of the pressure in your arteries as the heart relaxes. What are the causes? The cause of this condition is not known. What increases the risk? Some risk factors for high blood pressure are under your control. Others are not. Factors you can change  Smoking.  Having type 2 diabetes mellitus, high cholesterol, or both.  Not getting enough exercise or physical activity.  Being overweight.  Having too much fat, sugar, calories, or salt (sodium) in your diet.  Drinking too much alcohol. Factors that are difficult or impossible to change  Having chronic kidney disease.  Having a family history of high blood pressure.  Age. Risk increases with age.  Race. You may be at higher risk if you are African-American.  Gender. Men are at higher risk than women before age 74. After age 17, women are at higher risk than men.  Having obstructive sleep apnea.  Stress. What are the signs or symptoms? Extremely  high blood pressure (hypertensive crisis) may cause:  Headache.  Anxiety.  Shortness of breath.  Nosebleed.  Nausea and vomiting.  Severe chest pain.  Jerky movements you cannot control (seizures).  How is this diagnosed? This condition is diagnosed by measuring your blood pressure while you are seated, with your arm resting on a surface. The cuff of the blood pressure monitor will be placed directly against the skin of your upper arm at the level of your heart. It should be measured at least twice using the same arm. Certain conditions can cause a difference in blood pressure between your right and left arms. Certain factors can cause blood pressure readings to be lower or higher than normal (elevated) for a short period of time:  When your blood pressure is higher when you are in a health care provider's office than when you are at home, this is called white coat hypertension. Most people with this condition do not need medicines.  When your blood pressure is higher at home than when you are in a health care provider's office, this is called masked hypertension. Most people with this condition may need medicines to control blood pressure.  If you have a high blood pressure reading during one visit or you have normal blood pressure with other risk factors:  You may be asked to return on a different day to have your blood pressure checked again.  You may be asked to monitor your blood pressure at home for 1 week or longer.  If you are diagnosed  with hypertension, you may have other blood or imaging tests to help your health care provider understand your overall risk for other conditions. How is this treated? This condition is treated by making healthy lifestyle changes, such as eating healthy foods, exercising more, and reducing your alcohol intake. Your health care provider may prescribe medicine if lifestyle changes are not enough to get your blood pressure under control, and  if:  Your systolic blood pressure is above 130.  Your diastolic blood pressure is above 80.  Your personal target blood pressure may vary depending on your medical conditions, your age, and other factors. Follow these instructions at home: Eating and drinking  Eat a diet that is high in fiber and potassium, and low in sodium, added sugar, and fat. An example eating plan is called the DASH (Dietary Approaches to Stop Hypertension) diet. To eat this way: ? Eat plenty of fresh fruits and vegetables. Try to fill half of your plate at each meal with fruits and vegetables. ? Eat whole grains, such as whole wheat pasta, brown rice, or whole grain bread. Fill about one quarter of your plate with whole grains. ? Eat or drink low-fat dairy products, such as skim milk or low-fat yogurt. ? Avoid fatty cuts of meat, processed or cured meats, and poultry with skin. Fill about one quarter of your plate with lean proteins, such as fish, chicken without skin, beans, eggs, and tofu. ? Avoid premade and processed foods. These tend to be higher in sodium, added sugar, and fat.  Reduce your daily sodium intake. Most people with hypertension should eat less than 1,500 mg of sodium a day.  Limit alcohol intake to no more than 1 drink a day for nonpregnant women and 2 drinks a day for men. One drink equals 12 oz of beer, 5 oz of wine, or 1 oz of hard liquor. Lifestyle  Work with your health care provider to maintain a healthy body weight or to lose weight. Ask what an ideal weight is for you.  Get at least 30 minutes of exercise that causes your heart to beat faster (aerobic exercise) most days of the week. Activities may include walking, swimming, or biking.  Include exercise to strengthen your muscles (resistance exercise), such as pilates or lifting weights, as part of your weekly exercise routine. Try to do these types of exercises for 30 minutes at least 3 days a week.  Do not use any products that contain  nicotine or tobacco, such as cigarettes and e-cigarettes. If you need help quitting, ask your health care provider.  Monitor your blood pressure at home as told by your health care provider.  Keep all follow-up visits as told by your health care provider. This is important. Medicines  Take over-the-counter and prescription medicines only as told by your health care provider. Follow directions carefully. Blood pressure medicines must be taken as prescribed.  Do not skip doses of blood pressure medicine. Doing this puts you at risk for problems and can make the medicine less effective.  Ask your health care provider about side effects or reactions to medicines that you should watch for. Contact a health care provider if:  You think you are having a reaction to a medicine you are taking.  You have headaches that keep coming back (recurring).  You feel dizzy.  You have swelling in your ankles.  You have trouble with your vision. Get help right away if:  You develop a severe headache or confusion.  You  have unusual weakness or numbness.  You feel faint.  You have severe pain in your chest or abdomen.  You vomit repeatedly.  You have trouble breathing. Summary  Hypertension is when the force of blood pumping through your arteries is too strong. If this condition is not controlled, it may put you at risk for serious complications.  Your personal target blood pressure may vary depending on your medical conditions, your age, and other factors. For most people, a normal blood pressure is less than 120/80.  Hypertension is treated with lifestyle changes, medicines, or a combination of both. Lifestyle changes include weight loss, eating a healthy, low-sodium diet, exercising more, and limiting alcohol. This information is not intended to replace advice given to you by your health care provider. Make sure you discuss any questions you have with your health care provider. Document  Released: 03/11/2005 Document Revised: 02/07/2016 Document Reviewed: 02/07/2016 Elsevier Interactive Patient Education  [employer].

## 2017-05-07 NOTE — Assessment & Plan Note (Addendum)
Not controlled Continue hctz 12.5 mg daily Start losartan 25 mg  Work on decreasing salt intake Increase exercise Work on weight loss Has follow-up to establish with her new PCP in a couple of months

## 2017-05-07 NOTE — Assessment & Plan Note (Signed)
Following with psychiatry Currently taking Wellbutrin and states her depression is well controlled Management per psychiatry Will check EKG given history of prolonged QT on other antidepressants, but Wellbutrin should not cause this

## 2017-05-07 NOTE — Assessment & Plan Note (Signed)
During hospitalization after DVT/PE Was on fluoxetine and that was discontinued Currently on Wellbutrin, which should not cause QT prolongation, but psychiatrist would like her to be evaluated for this EKG today shows normal QT, sinus rhythm, no acute or concerning changes-we will give copy to psychiatrist

## 2017-05-14 DIAGNOSIS — F3341 Major depressive disorder, recurrent, in partial remission: Secondary | ICD-10-CM | POA: Diagnosis not present

## 2017-05-21 DIAGNOSIS — F3341 Major depressive disorder, recurrent, in partial remission: Secondary | ICD-10-CM | POA: Diagnosis not present

## 2017-05-28 DIAGNOSIS — F3341 Major depressive disorder, recurrent, in partial remission: Secondary | ICD-10-CM | POA: Diagnosis not present

## 2017-06-03 ENCOUNTER — Ambulatory Visit: Payer: Self-pay | Admitting: Family Medicine

## 2017-06-04 DIAGNOSIS — F3341 Major depressive disorder, recurrent, in partial remission: Secondary | ICD-10-CM | POA: Diagnosis not present

## 2017-06-11 DIAGNOSIS — F3341 Major depressive disorder, recurrent, in partial remission: Secondary | ICD-10-CM | POA: Diagnosis not present

## 2017-06-18 DIAGNOSIS — F3341 Major depressive disorder, recurrent, in partial remission: Secondary | ICD-10-CM | POA: Diagnosis not present

## 2017-06-24 DIAGNOSIS — F332 Major depressive disorder, recurrent severe without psychotic features: Secondary | ICD-10-CM | POA: Diagnosis not present

## 2017-06-24 DIAGNOSIS — F3341 Major depressive disorder, recurrent, in partial remission: Secondary | ICD-10-CM | POA: Diagnosis not present

## 2017-07-02 DIAGNOSIS — F3341 Major depressive disorder, recurrent, in partial remission: Secondary | ICD-10-CM | POA: Diagnosis not present

## 2017-07-09 DIAGNOSIS — F3341 Major depressive disorder, recurrent, in partial remission: Secondary | ICD-10-CM | POA: Diagnosis not present

## 2017-07-16 DIAGNOSIS — F3341 Major depressive disorder, recurrent, in partial remission: Secondary | ICD-10-CM | POA: Diagnosis not present

## 2017-07-23 DIAGNOSIS — F3341 Major depressive disorder, recurrent, in partial remission: Secondary | ICD-10-CM | POA: Diagnosis not present

## 2017-07-23 HISTORY — PX: OTHER SURGICAL HISTORY: SHX169

## 2017-07-30 DIAGNOSIS — F3341 Major depressive disorder, recurrent, in partial remission: Secondary | ICD-10-CM | POA: Diagnosis not present

## 2017-08-06 DIAGNOSIS — F3341 Major depressive disorder, recurrent, in partial remission: Secondary | ICD-10-CM | POA: Diagnosis not present

## 2017-08-07 ENCOUNTER — Encounter: Payer: Self-pay | Admitting: Nurse Practitioner

## 2017-08-07 ENCOUNTER — Ambulatory Visit: Payer: BLUE CROSS/BLUE SHIELD | Admitting: Nurse Practitioner

## 2017-08-07 ENCOUNTER — Other Ambulatory Visit (INDEPENDENT_AMBULATORY_CARE_PROVIDER_SITE_OTHER): Payer: BLUE CROSS/BLUE SHIELD

## 2017-08-07 VITALS — BP 128/78 | HR 85 | Temp 98.8°F | Resp 16 | Ht 69.0 in | Wt 288.8 lb

## 2017-08-07 DIAGNOSIS — Z124 Encounter for screening for malignant neoplasm of cervix: Secondary | ICD-10-CM | POA: Diagnosis not present

## 2017-08-07 DIAGNOSIS — D508 Other iron deficiency anemias: Secondary | ICD-10-CM

## 2017-08-07 DIAGNOSIS — Z114 Encounter for screening for human immunodeficiency virus [HIV]: Secondary | ICD-10-CM

## 2017-08-07 DIAGNOSIS — Z13220 Encounter for screening for lipoid disorders: Secondary | ICD-10-CM

## 2017-08-07 DIAGNOSIS — I10 Essential (primary) hypertension: Secondary | ICD-10-CM | POA: Diagnosis not present

## 2017-08-07 DIAGNOSIS — Z23 Encounter for immunization: Secondary | ICD-10-CM

## 2017-08-07 DIAGNOSIS — Z0000 Encounter for general adult medical examination without abnormal findings: Secondary | ICD-10-CM

## 2017-08-07 DIAGNOSIS — Z6841 Body Mass Index (BMI) 40.0 and over, adult: Secondary | ICD-10-CM

## 2017-08-07 LAB — LIPID PANEL
CHOL/HDL RATIO: 6
CHOLESTEROL: 234 mg/dL — AB (ref 0–200)
HDL: 42 mg/dL (ref >39.00)
LDL CALC: 170 mg/dL — AB (ref 0–99)
NONHDL: 192.06
Triglycerides: 110 mg/dL (ref 0.0–149.0)
VLDL: 22 mg/dL (ref 0.0–40.0)

## 2017-08-07 LAB — COMPREHENSIVE METABOLIC PANEL
ALT: 10 U/L (ref 0–35)
AST: 14 U/L (ref 0–37)
Albumin: 4.1 g/dL (ref 3.5–5.2)
Alkaline Phosphatase: 106 U/L (ref 39–117)
BILIRUBIN TOTAL: 0.8 mg/dL (ref 0.2–1.2)
BUN: 9 mg/dL (ref 6–23)
CO2: 26 meq/L (ref 19–32)
Calcium: 9.1 mg/dL (ref 8.4–10.5)
Chloride: 103 mEq/L (ref 96–112)
Creatinine, Ser: 0.85 mg/dL (ref 0.40–1.20)
GFR: 102.79 mL/min (ref >60.00)
GLUCOSE: 95 mg/dL (ref 70–99)
Potassium: 3.9 mEq/L (ref 3.5–5.1)
Sodium: 137 mEq/L (ref 135–145)
Total Protein: 7.7 g/dL (ref 6.0–8.3)

## 2017-08-07 LAB — HEMOGLOBIN A1C: Hgb A1c MFr Bld: 5.8 % (ref 4.6–6.5)

## 2017-08-07 LAB — CBC
HCT: 39.6 % (ref 36.0–46.0)
HEMOGLOBIN: 12.9 g/dL (ref 12.0–15.0)
MCHC: 32.7 g/dL (ref 30.0–36.0)
MCV: 80.7 fl (ref 78.0–100.0)
Platelets: 354 10*3/uL (ref 150.0–400.0)
RBC: 4.9 Mil/uL (ref 3.87–5.11)
RDW: 14.3 % (ref 11.5–15.5)
WBC: 5.7 10*3/uL (ref 4.0–10.5)

## 2017-08-07 LAB — FERRITIN: Ferritin: 14 ng/mL (ref 10.0–291.0)

## 2017-08-07 LAB — TSH: TSH: 1.16 u[IU]/mL (ref 0.35–4.50)

## 2017-08-07 LAB — IBC PANEL
Iron: 46 ug/dL (ref 42–145)
SATURATION RATIOS: 12.8 % — AB (ref 20.0–50.0)
Transferrin: 257 mg/dL (ref 212.0–360.0)

## 2017-08-07 NOTE — Progress Notes (Signed)
Name: Shannon [name]   MRN: [id number]    DOB: Feb 28, 1991   Date:08/07/2017       Progress Note  Subjective  Chief Complaint  Chief Complaint  Patient presents with  . Establish Care    CPE, fasting     HPI Shannon [name] is establishing care with me today. She also follows routinely with  psychiatry for mood disorder  Patient presents for annual CPE.  Diet: does not watch her diet, eats junk food Exercise: does not exercise regularly  USPSTF grade A and B recommendations  Depression:  Depression screen [facility] 2/9 08/06/2016  Decreased Interest 0  Down, Depressed, Hopeless 0  PHQ - 2 Score 0  Altered sleeping 0  Tired, decreased energy 1  Change in appetite 0  Feeling bad or failure about yourself  0  Trouble concentrating 0  Moving slowly or fidgety/restless 0  Suicidal thoughts 0  PHQ-9 Score 1    Hypertension -maintained on losartan 25. She has actually not been taking her losartan for at least a week and half due to better blood pressure readings recently.  She has noticed a trend down in her blood pressures recently as she she has been more active at work- recent readings 120s-130s/80s- 90s. She would like to try staying off her losartan  BP Readings from Last 3 Encounters:  08/07/17 128/78  05/07/17 (!) 134/104  12/30/16 (!) 142/94    Obesity: Wt Readings from Last 3 Encounters:  08/07/17 288 lb 12.8 oz (131 kg)  05/07/17 281 lb (127.5 kg)  12/30/16 285 lb (129.3 kg)   BMI Readings from Last 3 Encounters:  08/07/17 42.65 kg/m  05/07/17 41.50 kg/m  12/30/16 42.09 kg/m    Alcohol: no Tobacco use: no, never  HIV screening: done in past, update today STD testing and prevention (chl/gon/syphilis): no concerns, declines Intimate partner violence:feels safe   Vaccinations: TDAP today  Advanced Care Planning: A voluntary discussion about advance care planning including the explanation and discussion of advance directives.  Discussed health care proxy and  Living will, and the patient DOES NOT have a living will at present time. If patient does have living will, I have requested they bring this to the clinic to be scanned in to their chart.  Cervical cancer screening: referral to GYN for annual womens health care needs  Lipids:  No results found for: CHOL No results found for: HDL No results found for: LDLCALC No results found for: TRIG No results found for: CHOLHDL No results found for: LDLDIRECT  Glucose:  Glucose, Bld  Date Value Ref Range Status  12/30/2016 89 70 - 99 mg/dL Final  09/24/2016 97 70 - 99 mg/dL Final  07/06/2016 98 65 - 99 mg/dL Final    Skin cancer: no concerning lesions or moles, does not wear sunscreen    Aspirin: not indicated ECG: not indicated   Patient Active Problem List   Diagnosis Date Noted  . Hypertension 12/30/2016  . Slurred speech 12/30/2016  . Iron deficiency anemia 09/24/2016  . Fatigue 08/06/2016  . Pulmonary embolism with acute cor pulmonale ([place]) 07/04/2016  . Hypokalemia 07/04/2016  . Prolonged QT interval 07/04/2016  . Depression 07/04/2016  . Adjustment disorder with emotional disturbance 03/31/2016  . Major depressive disorder, recurrent severe without psychotic features ([place]) 01/10/2016  . Major depressive disorder, recurrent episode, mild ([place]) 01/01/2016  . Suicide ideation 02/16/2013    Past Surgical History:  Procedure Laterality Date  . NO PAST SURGERIES  Family History  Problem Relation Age of Onset  . Healthy Mother   . Healthy Father   . Pulmonary embolism Neg Hx   . Deep vein thrombosis Neg Hx     Social History   Socioeconomic History  . Marital status: Single    Spouse name: Not on file  . Number of children: 0  . Years of education: 60  . Highest education level: Not on file  Occupational History  . Occupation: Teacher, English as a foreign language  . Financial resource strain: Not on file  . Food insecurity:    Worry: Not on file    Inability: Not on file   . Transportation needs:    Medical: Not on file    Non-medical: Not on file  Tobacco Use  . Smoking status: Never Smoker  . Smokeless tobacco: Never Used  Substance and Sexual Activity  . Alcohol use: No  . Drug use: No  . Sexual activity: Never    Birth control/protection: None  Lifestyle  . Physical activity:    Days per week: Not on file    Minutes per session: Not on file  . Stress: Not on file  Relationships  . Social connections:    Talks on phone: Not on file    Gets together: Not on file    Attends religious service: Not on file    Active member of club or organization: Not on file    Attends meetings of clubs or organizations: Not on file    Relationship status: Not on file  . Intimate partner violence:    Fear of current or ex partner: Not on file    Emotionally abused: Not on file    Physically abused: Not on file    Forced sexual activity: Not on file  Other Topics Concern  . Not on file  Social History Narrative   Fun/Hobby: Going to the gym, writing.     Current Outpatient Medications:  .  buPROPion (WELLBUTRIN) 100 MG tablet, Take 100 mg by mouth 2 (two) times daily., Disp: , Rfl:  .  Ferrous Sulfate (IRON) 325 (65 Fe) MG TABS, Taking 1 tab daily, Disp: 30 each, Rfl: 0 .  losartan (COZAAR) 25 MG tablet, Take 1 tablet (25 mg total) by mouth daily., Disp: 30 tablet, Rfl: 5 .  Multiple Vitamin (MULTIVITAMIN WITH MINERALS) TABS tablet, Take 1 tablet by mouth daily., Disp: , Rfl:  .  hydrochlorothiazide (MICROZIDE) 12.5 MG capsule, Take 1 capsule (12.5 mg total) by mouth daily. (Patient not taking: Reported on 08/07/2017), Disp: 30 capsule, Rfl: 1  Allergies  Allergen Reactions  . Bupropion Other (See Comments)    Reaction:  Constipation, states just the extended release     ROS  Constitutional: Negative for fever or weight change.  Respiratory: Negative for cough and shortness of breath.   Cardiovascular: Negative for chest pain or palpitations.   Gastrointestinal: Negative for abdominal pain, no bowel changes.  Musculoskeletal: Negative for gait problem or joint swelling.  Skin: Negative for rash.  Neurological: Negative for dizziness or headache.  No other specific complaints in a complete review of systems (except as listed in HPI above).   Objective  Vitals:   08/07/17 0826  BP: 128/78  Pulse: 85  Resp: 16  Temp: 98.8 F (37.1 C)  TempSrc: Oral  SpO2: 98%  Weight: 288 lb 12.8 oz (131 kg)  Height: 5\' 9"  (1.753 m)    Body mass index is 42.65 kg/m.  Physical Exam Vital  signs reviewed.  Constitutional: Patient appears well-developed and well-nourished. Obese. No distress.  HENT: Head: Normocephalic and atraumatic. Ears: B TMs ok, no erythema or effusion; Nose: Nose normal. Mouth/Throat: Oropharynx is clear and moist. No oropharyngeal exudate.  Eyes: Conjunctivae and EOM are normal. Pupils are equal, round, and reactive to light. No scleral icterus.  Neck: Normal range of motion. Neck supple. No cervical adenopathy. No thyromegaly present.  Cardiovascular: Normal rate, regular rhythm and normal heart sounds.  No murmur heard. No BLE edema. Pulmonary/Chest: Effort normal and breath sounds normal. No respiratory distress. Abdominal: Soft. Bowel sounds are normal, no distension. There is no tenderness. no masses Musculoskeletal: Normal range of motion, no joint effusions. No gross deformities Neurological: She is alert and oriented to person, place, and time. No cranial nerve deficit. Coordination, balance, strength, speech and gait are normal.  Skin: Skin is warm and dry. No rash noted. No erythema.  Psychiatric: Patient has a normal mood and affect. behavior is normal. Judgment and thought content normal.   PHQ2/9: Depression screen [facility] 2/9 08/06/2016  Decreased Interest 0  Down, Depressed, Hopeless 0  PHQ - 2 Score 0  Altered sleeping 0  Tired, decreased energy 1  Change in appetite 0  Feeling bad or failure  about yourself  0  Trouble concentrating 0  Moving slowly or fidgety/restless 0  Suicidal thoughts 0  PHQ-9 Score 1   Follows with psychiatry  Assessment & Plan RTC in 1 month for F/U: HTN- holding losartan, keeping BP log

## 2017-08-07 NOTE — Patient Instructions (Addendum)
Please head downstairs for lab work/x-rays. If any of your test results are critically abnormal, you will be contacted right away. Your results may be released to your MyChart for viewing before I am able to provide you with my response. I will contact you within a week about your test results and any recommendations for abnormalities.  Please try to check your blood pressure once daily or at least a few times a week, at the same time each day, and keep a log. The goal is to keep your blood pressure below 140/90.   I have placed a referral to gynecology for womens health care. Our office will call you to schedule this appointment. You should hear from our office in 7-10 days.   Health Maintenance, Female Adopting a healthy lifestyle and getting preventive care can go a long way to promote health and wellness. Talk with your health care provider about what schedule of regular examinations is right for you. This is a good chance for you to check in with your provider about disease prevention and staying healthy. In between checkups, there are plenty of things you can do on your own. Experts have done a lot of research about which lifestyle changes and preventive measures are most likely to keep you healthy. Ask your health care provider for more information. Weight and diet Eat a healthy diet  Be sure to include plenty of vegetables, fruits, low-fat dairy products, and lean protein.  Do not eat a lot of foods high in solid fats, added sugars, or salt.  Get regular exercise. This is one of the most important things you can do for your health. ? Most adults should exercise for at least 150 minutes each week. The exercise should increase your heart rate and make you sweat (moderate-intensity exercise). ? Most adults should also do strengthening exercises at least twice a week. This is in addition to the moderate-intensity exercise.  Maintain a healthy weight  Body mass index (BMI) is a  measurement that can be used to identify possible weight problems. It estimates body fat based on height and weight. Your health care provider can help determine your BMI and help you achieve or maintain a healthy weight.  For females 63 years of age and older: ? A BMI below 18.5 is considered underweight. ? A BMI of 18.5 to 24.9 is normal. ? A BMI of 25 to 29.9 is considered overweight. ? A BMI of 30 and above is considered obese.  Watch levels of cholesterol and blood lipids  You should start having your blood tested for lipids and cholesterol at 28 years of age, then have this test every 5 years.  You may need to have your cholesterol levels checked more often if: ? Your lipid or cholesterol levels are high. ? You are older than 28 years of age. ? You are at high risk for heart disease.  Cancer screening Lung Cancer  Lung cancer screening is recommended for adults 29-59 years old who are at high risk for lung cancer because of a history of smoking.  A yearly low-dose CT scan of the lungs is recommended for people who: ? Currently smoke. ? Have quit within the past 15 years. ? Have at least a 30-pack-year history of smoking. A pack year is smoking an average of one pack of cigarettes a day for 1 year.  Yearly screening should continue until it has been 15 years since you quit.  Yearly screening should stop if you develop a  health problem that would prevent you from having lung cancer treatment.  Breast Cancer  Practice breast self-awareness. This means understanding how your breasts normally appear and feel.  It also means doing regular breast self-exams. Let your health care provider know about any changes, no matter how small.  If you are in your 20s or 30s, you should have a clinical breast exam (CBE) by a health care provider every 1-3 years as part of a regular health exam.  If you are 37 or older, have a CBE every year. Also consider having a breast X-ray (mammogram)  every year.  If you have a family history of breast cancer, talk to your health care provider about genetic screening.  If you are at high risk for breast cancer, talk to your health care provider about having an MRI and a mammogram every year.  Breast cancer gene (BRCA) assessment is recommended for women who have family members with BRCA-related cancers. BRCA-related cancers include: ? Breast. ? Ovarian. ? Tubal. ? Peritoneal cancers.  Results of the assessment will determine the need for genetic counseling and BRCA1 and BRCA2 testing.  Cervical Cancer Your health care provider may recommend that you be screened regularly for cancer of the pelvic organs (ovaries, uterus, and vagina). This screening involves a pelvic examination, including checking for microscopic changes to the surface of your cervix (Pap test). You may be encouraged to have this screening done every 3 years, beginning at age 55.  For women ages 102-65, health care providers may recommend pelvic exams and Pap testing every 3 years, or they may recommend the Pap and pelvic exam, combined with testing for human papilloma virus (HPV), every 5 years. Some types of HPV increase your risk of cervical cancer. Testing for HPV may also be done on women of any age with unclear Pap test results.  Other health care providers may not recommend any screening for nonpregnant women who are considered low risk for pelvic cancer and who do not have symptoms. Ask your health care provider if a screening pelvic exam is right for you.  If you have had past treatment for cervical cancer or a condition that could lead to cancer, you need Pap tests and screening for cancer for at least 20 years after your treatment. If Pap tests have been discontinued, your risk factors (such as having a new sexual partner) need to be reassessed to determine if screening should resume. Some women have medical problems that increase the chance of getting cervical  cancer. In these cases, your health care provider may recommend more frequent screening and Pap tests.  Colorectal Cancer  This type of cancer can be detected and often prevented.  Routine colorectal cancer screening usually begins at 28 years of age and continues through 28 years of age.  Your health care provider may recommend screening at an earlier age if you have risk factors for colon cancer.  Your health care provider may also recommend using home test kits to check for hidden blood in the stool.  A small camera at the end of a tube can be used to examine your colon directly (sigmoidoscopy or colonoscopy). This is done to check for the earliest forms of colorectal cancer.  Routine screening usually begins at age 52.  Direct examination of the colon should be repeated every 5-10 years through 28 years of age. However, you may need to be screened more often if early forms of precancerous polyps or small growths are found.  Skin  Cancer  Check your skin from head to toe regularly.  Tell your health care provider about any new moles or changes in moles, especially if there is a change in a mole's shape or color.  Also tell your health care provider if you have a mole that is larger than the size of a pencil eraser.  Always use sunscreen. Apply sunscreen liberally and repeatedly throughout the day.  Protect yourself by wearing long sleeves, pants, a wide-brimmed hat, and sunglasses whenever you are outside.  Heart disease, diabetes, and high blood pressure  High blood pressure causes heart disease and increases the risk of stroke. High blood pressure is more likely to develop in: ? People who have blood pressure in the high end of the normal range (130-139/85-89 mm Hg). ? People who are overweight or obese. ? People who are African American.  If you are 31-61 years of age, have your blood pressure checked every 3-5 years. If you are 96 years of age or older, have your blood  pressure checked every year. You should have your blood pressure measured twice-once when you are at a hospital or clinic, and once when you are not at a hospital or clinic. Record the average of the two measurements. To check your blood pressure when you are not at a hospital or clinic, you can use: ? An automated blood pressure machine at a pharmacy. ? A home blood pressure monitor.  If you are between 39 years and 75 years old, ask your health care provider if you should take aspirin to prevent strokes.  Have regular diabetes screenings. This involves taking a blood sample to check your fasting blood sugar level. ? If you are at a normal weight and have a low risk for diabetes, have this test once every three years after 28 years of age. ? If you are overweight and have a high risk for diabetes, consider being tested at a younger age or more often. Preventing infection Hepatitis B  If you have a higher risk for hepatitis B, you should be screened for this virus. You are considered at high risk for hepatitis B if: ? You were born in a country where hepatitis B is common. Ask your health care provider which countries are considered high risk. ? Your parents were born in a high-risk country, and you have not been immunized against hepatitis B (hepatitis B vaccine). ? You have HIV or AIDS. ? You use needles to inject street drugs. ? You live with someone who has hepatitis B. ? You have had sex with someone who has hepatitis B. ? You get hemodialysis treatment. ? You take certain medicines for conditions, including cancer, organ transplantation, and autoimmune conditions.  Hepatitis C  Blood testing is recommended for: ? Everyone born from [birth year] through 1966. ? Anyone with known risk factors for hepatitis C.  Sexually transmitted infections (STIs)  You should be screened for sexually transmitted infections (STIs) including gonorrhea and chlamydia if: ? You are sexually active and are  younger than 28 years of age. ? You are older than 28 years of age and your health care provider tells you that you are at risk for this type of infection. ? Your sexual activity has changed since you were last screened and you are at an increased risk for chlamydia or gonorrhea. Ask your health care provider if you are at risk.  If you do not have HIV, but are at risk, it may be recommended that you take  a prescription medicine daily to prevent HIV infection. This is called pre-exposure prophylaxis (PrEP). You are considered at risk if: ? You are sexually active and do not regularly use condoms or know the HIV status of your partner(s). ? You take drugs by injection. ? You are sexually active with a partner who has HIV.  Talk with your health care provider about whether you are at high risk of being infected with HIV. If you choose to begin PrEP, you should first be tested for HIV. You should then be tested every 3 months for as long as you are taking PrEP. Pregnancy  If you are premenopausal and you may become pregnant, ask your health care provider about preconception counseling.  If you may become pregnant, take 400 to 800 micrograms (mcg) of folic acid every day.  If you want to prevent pregnancy, talk to your health care provider about birth control (contraception). Osteoporosis and menopause  Osteoporosis is a disease in which the bones lose minerals and strength with aging. This can result in serious bone fractures. Your risk for osteoporosis can be identified using a bone density scan.  If you are 70 years of age or older, or if you are at risk for osteoporosis and fractures, ask your health care provider if you should be screened.  Ask your health care provider whether you should take a calcium or vitamin D supplement to lower your risk for osteoporosis.  Menopause may have certain physical symptoms and risks.  Hormone replacement therapy may reduce some of these symptoms and  risks. Talk to your health care provider about whether hormone replacement therapy is right for you. Follow these instructions at home:  Schedule regular health, dental, and eye exams.  Stay current with your immunizations.  Do not use any tobacco products including cigarettes, chewing tobacco, or electronic cigarettes.  If you are pregnant, do not drink alcohol.  If you are breastfeeding, limit how much and how often you drink alcohol.  Limit alcohol intake to no more than 1 drink per day for nonpregnant women. One drink equals 12 ounces of beer, 5 ounces of wine, or 1 ounces of hard liquor.  Do not use street drugs.  Do not share needles.  Ask your health care provider for help if you need support or information about quitting drugs.  Tell your health care provider if you often feel depressed.  Tell your health care provider if you have ever been abused or do not feel safe at home. This information is not intended to replace advice given to you by your health care provider. Make sure you discuss any questions you have with your health care provider. Document Released: 09/24/2010 Document Revised: 08/17/2015 Document Reviewed: 12/13/2014 Elsevier Interactive Patient Education  [employer].

## 2017-08-08 LAB — HIV ANTIBODY (ROUTINE TESTING W REFLEX): HIV: NONREACTIVE

## 2017-08-10 ENCOUNTER — Encounter: Payer: Self-pay | Admitting: Nurse Practitioner

## 2017-08-10 DIAGNOSIS — Z0000 Encounter for general adult medical examination without abnormal findings: Secondary | ICD-10-CM | POA: Insufficient documentation

## 2017-08-10 NOTE — Assessment & Plan Note (Addendum)
She has not been taking iron daily and requests lab update Per chart review, her prior provider felt her anemia was related to heavy menstrual periods  I have referred her to GYN today for routine womens healthcare needs and will update labs - CBC; Future - IBC panel; Future - Ferritin; Future

## 2017-08-10 NOTE — Assessment & Plan Note (Signed)
-  USPSTF grade A and B recommendations reviewed with patient; age-appropriate recommendations, preventive care, screening tests, etc discussed and encouraged; healthy living and sunscreen use encouraged; see AVS for patient education given to patient. Advanced directives packet provided -Discussed importance of 150 minutes of physical activity weekly, eat two servings of fish weekly, eat 6 servings of fruit/vegetables daily and drink plenty of water and avoid sweet beverages.  -Follow up and care instructions discussed and provided in AVS.  -Reviewed Health Maintenance:  Screening for cervical cancer- Ambulatory referral to Gynecology Screening for HIV (human immunodeficiency virus)- HIV antibody; Future Need for Tdap vaccination- Tdap vaccine greater than or equal to 7yo IM  Screening for cholesterol level- Lipid panel; Future  BMI 40.0-44.9, adult (HCC) - Comprehensive metabolic panel; Future - CBC; Future - TSH; Future - Hemoglobin A1c;

## 2017-08-10 NOTE — Assessment & Plan Note (Signed)
BP reading normal today reportedly off losartan for over a week. Per chart review she is actually supposed to be taking HCTZ as well but reported that she has not been taking this for some time and it has now been removed from her med list Hold losartan for now She will Continue to monitor BP readings at home RTC in 1 month for F/U, or sooner for readings over 140/90 - Comprehensive metabolic panel; Future - CBC; Future - TSH; Future

## 2017-08-11 DIAGNOSIS — E669 Obesity, unspecified: Secondary | ICD-10-CM | POA: Insufficient documentation

## 2017-08-11 DIAGNOSIS — Z6836 Body mass index (BMI) 36.0-36.9, adult: Secondary | ICD-10-CM | POA: Insufficient documentation

## 2017-08-11 DIAGNOSIS — Z6837 Body mass index (BMI) 37.0-37.9, adult: Secondary | ICD-10-CM

## 2017-08-11 NOTE — Assessment & Plan Note (Signed)
Morbid obesity ([place]) - Comprehensive metabolic panel; Future - CBC; Future - TSH; Future - Hemoglobin A1c; Future

## 2017-08-13 DIAGNOSIS — F3341 Major depressive disorder, recurrent, in partial remission: Secondary | ICD-10-CM | POA: Diagnosis not present

## 2017-08-20 DIAGNOSIS — F3341 Major depressive disorder, recurrent, in partial remission: Secondary | ICD-10-CM | POA: Diagnosis not present

## 2017-08-27 DIAGNOSIS — F3341 Major depressive disorder, recurrent, in partial remission: Secondary | ICD-10-CM | POA: Diagnosis not present

## 2017-09-03 DIAGNOSIS — F3341 Major depressive disorder, recurrent, in partial remission: Secondary | ICD-10-CM | POA: Diagnosis not present

## 2017-09-10 ENCOUNTER — Ambulatory Visit: Payer: BLUE CROSS/BLUE SHIELD | Admitting: Nurse Practitioner

## 2017-09-10 ENCOUNTER — Encounter: Payer: Self-pay | Admitting: Nurse Practitioner

## 2017-09-10 DIAGNOSIS — I10 Essential (primary) hypertension: Secondary | ICD-10-CM

## 2017-09-10 DIAGNOSIS — F3341 Major depressive disorder, recurrent, in partial remission: Secondary | ICD-10-CM | POA: Diagnosis not present

## 2017-09-10 NOTE — Progress Notes (Signed)
Name: Shannon [name]   MRN: [id number]    DOB: 1990-09-18   Date:09/10/2017       Progress Note  Subjective  Chief Complaint  Chief Complaint  Patient presents with  . Follow-up    blood pressure    HPI  Hypertension - At her last OV on 08/07/17, she told me that she had stopped taking her losartan for about 1 week due to better blood pressure readings at home after becoming more active. She wanted to try being off the losartan to see if her BP would remain stable We discussed holding losartan for 1 month and logging BP readings at home She is back today for follow up. Reports readings over the past month have ranged from 120s-140s/80s-90s. She has also been less active over past month. She is thinking about trying to improve her diet, using [email or web address]-[email or web address] to pick foods. She really wants to lose some weight, to avoid long term effects of high cholesterol and elevated blood sugar. Denies headaches, vision changes, chest pain, shortness of breath, edema. Overall feels well.  BP Readings from Last 3 Encounters:  09/10/17 134/88  08/07/17 128/78  05/07/17 (!) 134/104    Patient Active Problem List   Diagnosis Date Noted  . Morbid obesity ([place]) 08/11/2017  . Routine general medical examination at a health care facility 08/10/2017  . Hypertension 12/30/2016  . Slurred speech 12/30/2016  . Iron deficiency anemia 09/24/2016  . Fatigue 08/06/2016  . Pulmonary embolism with acute cor pulmonale ([place]) 07/04/2016  . Hypokalemia 07/04/2016  . Prolonged QT interval 07/04/2016  . Depression 07/04/2016  . Adjustment disorder with emotional disturbance 03/31/2016  . Major depressive disorder, recurrent severe without psychotic features ([place]) 01/10/2016  . Major depressive disorder, recurrent episode, mild ([place]) 01/01/2016  . Suicide ideation 02/16/2013    Past Surgical History:  Procedure Laterality Date  . NO PAST SURGERIES      Family History  Problem Relation Age of Onset  .  Healthy Mother   . Healthy Father   . Pulmonary embolism Neg Hx   . Deep vein thrombosis Neg Hx     Social History   Socioeconomic History  . Marital status: Single    Spouse name: Not on file  . Number of children: 0  . Years of education: 57  . Highest education level: Not on file  Occupational History  . Occupation: Teacher, English as a foreign language  . Financial resource strain: Not on file  . Food insecurity:    Worry: Not on file    Inability: Not on file  . Transportation needs:    Medical: Not on file    Non-medical: Not on file  Tobacco Use  . Smoking status: Never Smoker  . Smokeless tobacco: Never Used  Substance and Sexual Activity  . Alcohol use: No  . Drug use: No  . Sexual activity: Never    Birth control/protection: None  Lifestyle  . Physical activity:    Days per week: Not on file    Minutes per session: Not on file  . Stress: Not on file  Relationships  . Social connections:    Talks on phone: Not on file    Gets together: Not on file    Attends religious service: Not on file    Active member of club or organization: Not on file    Attends meetings of clubs or organizations: Not on file    Relationship status: Not on file  . Intimate partner violence:  Fear of current or ex partner: Not on file    Emotionally abused: Not on file    Physically abused: Not on file    Forced sexual activity: Not on file  Other Topics Concern  . Not on file  Social History Narrative   Fun/Hobby: Going to the gym, writing.     Current Outpatient Medications:  .  buPROPion (WELLBUTRIN) 100 MG tablet, Take 100 mg by mouth 2 (two) times daily., Disp: , Rfl:  .  Ferrous Sulfate (IRON) 325 (65 Fe) MG TABS, Taking 1 tab daily, Disp: 30 each, Rfl: 0 .  losartan (COZAAR) 25 MG tablet, Take 1 tablet (25 mg total) by mouth daily., Disp: 30 tablet, Rfl: 5 .  Multiple Vitamin (MULTIVITAMIN WITH MINERALS) TABS tablet, Take 1 tablet by mouth daily., Disp: , Rfl:   Allergies   Allergen Reactions  . Bupropion Other (See Comments)    Reaction:  Constipation, states just the extended release     ROS See HPI  Objective  Vitals:   09/10/17 1026  BP: 134/88  Pulse: 77  Resp: 16  Temp: 99.1 F (37.3 C)  TempSrc: Oral  SpO2: 98%  Weight: 286 lb (129.7 kg)  Height: 5\' 9"  (1.753 m)    Body mass index is 42.23 kg/m.  Physical Exam Vital signs reviewed.  Constitutional: Patient appears well-developed and well-nourished. Obese. No distress.  HENT: Head: Normocephalic and atraumatic; Nose: Nose normal. Mouth/Throat: Oropharynx is clear and moist. No oropharyngeal exudate.  Eyes: Conjunctivae and EOM are normal. Pupils are equal, round, and reactive to light. No scleral icterus.  Neck: Normal range of motion. Neck supple. Cardiovascular: Normal rate, regular rhythm and normal heart sounds.  No murmur heard. Distal pulses intact. Pulmonary/Chest: Effort normal and breath sounds normal. No respiratory distress. Neurological: She is alert and oriented to person, place, and time. No cranial nerve deficit. Coordination, balance, strength, speech and gait are normal.  Skin: Skin is warm and dry. No rash noted. No erythema.  Psychiatric: Patient has a normal mood and affect. behavior is normal. Judgment and thought content normal.   Assessment & Plan RTC in 1 month for F/U: HTN- resuming losartan, recheck BP and BMET

## 2017-09-10 NOTE — Assessment & Plan Note (Signed)
BP readings have increased off losartan, recommend she resume losartan 25 mg daily and she is agreeable Continue to monitor BP readings at home RTC in 1 month for F/U to recheck BP and BMET back on losartan

## 2017-09-10 NOTE — Patient Instructions (Addendum)
Please resume your losartan 25 mg once daily. Please try to check your blood pressure once daily or at least a few times a week, at the same time each day, and keep a log. Your blood pressure goal is <140/90. Please return in about 1 month so I can see how you are doing back on the losartan.  I have placed a referral to weight management. Our office will call you to schedule this appointment. You should hear from our office in 7-10 days.

## 2017-09-10 NOTE — Assessment & Plan Note (Signed)
Discussed the role of healthy diet and exercise in the management of weight and improvement in overall health, recommend increased exercise and calorie counting to reduce weight Discussed referral to weight management and she is very interested, referral placed - Amb Ref to Medical Weight Management

## 2017-09-17 DIAGNOSIS — F3341 Major depressive disorder, recurrent, in partial remission: Secondary | ICD-10-CM | POA: Diagnosis not present

## 2017-09-24 ENCOUNTER — Ambulatory Visit: Payer: BLUE CROSS/BLUE SHIELD | Admitting: Women's Health

## 2017-09-24 ENCOUNTER — Encounter: Payer: Self-pay | Admitting: Women's Health

## 2017-09-24 VITALS — BP 130/82 | Ht 69.0 in | Wt 273.6 lb

## 2017-09-24 DIAGNOSIS — Z01419 Encounter for gynecological examination (general) (routine) without abnormal findings: Secondary | ICD-10-CM

## 2017-09-24 DIAGNOSIS — Z23 Encounter for immunization: Secondary | ICD-10-CM | POA: Diagnosis not present

## 2017-09-24 MED ORDER — NORETHINDRONE 0.35 MG PO TABS: 1.0000 | ORAL_TABLET | Freq: Every day | ORAL | 4 refills | Status: DC

## 2017-09-24 NOTE — Progress Notes (Signed)
Shannon [name] 1990/10/14 [id number]    History:    Presents for patient annual exam.  Regular monthly 7-day cycles with cramping and heavy flow.  06/2016  PE while on combination birth control pill, OCP was stopped, currently on no preventative PE medication.  Primary care managing anxiety/depression/hypertension.  [place].  Did not receive Gardasil.  Past medical history, past surgical history, family history and social history were all reviewed and documented in the EPIC chart.  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  Adopted at birth by maternal aunt (biologic mother age 51).  ROS:  A ROS was performed and pertinent positives and negatives are included.  Exam:  Vitals:   09/24/17 1019  BP: 130/82  Weight: 273 lb 9.6 oz (124.1 kg)  Height: 5\' 9"  (1.753 m)   Body mass index is 40.4 kg/m.   General appearance:  Normal Thyroid:  Symmetrical, normal in size, without palpable masses or nodularity. Respiratory  Auscultation:  Clear without wheezing or rhonchi Cardiovascular  Auscultation:  Regular rate, without rubs, murmurs or gallops  Edema/varicosities:  Not grossly evident Abdominal  Soft,nontender, without masses, guarding or rebound.  Liver/spleen:  No organomegaly noted  Hernia:  None appreciated  Skin  Inspection:  Grossly normal   Breasts: Examined lying and sitting.     Right: Without masses, retractions, discharge or axillary adenopathy.     Left: Without masses, retractions, discharge or axillary adenopathy. Gentitourinary   Inguinal/mons:  Normal without inguinal adenopathy  External genitalia:  Normal  BUS/Urethra/Skene's glands:  Normal  Vagina:  Normal  Cervix:  Normal  Uterus:   normal in size, shape and contour.  Midline and mobile  Adnexa/parametria:     Rt: Without masses or tenderness.   Lt: Without masses or tenderness.  Anus and perineum: Normal    Assessment/Plan:  27 y.o. SBF [place] for annual exam.    Monthly 7-day cycles  with menorrhagia and dysmenorrhea 06/2016 PE while on combination OCs Morbid obesity Anxiety/depression-therapist Hypertension primary care managing labs and meds  Plan: Options reviewed we will try Micronor, reviewed slight risk for blood clots and strokes will call if any shortness of breath or changes.  Reviewed may get some irregular spotting, reviewed no placebo week.  Got up instructions reviewed.  Reviewed importance of avoiding all estrogen products.  Reviewed importance of increasing regular exercise, decreasing calorie/carbs.  Condoms encouraged if becomes sexually active.  SBE's, continue MVI with iron in it daily.  Pap.  Gardasil information given first given instructed to return to office in 2 and 6 months to complete series.   [place], 1:30 PM 09/24/2017

## 2017-09-24 NOTE — Patient Instructions (Signed)
Carbohydrate Counting for Diabetes Mellitus, Adult Carbohydrate counting is a method for keeping track of how many carbohydrates you eat. Eating carbohydrates naturally increases the amount of sugar (glucose) in the blood. Counting how many carbohydrates you eat helps keep your blood glucose within normal limits, which helps you manage your diabetes (diabetes mellitus). It is important to know how many carbohydrates you can safely have in each meal. This is different for every person. A diet and nutrition specialist (registered dietitian) can help you make a meal plan and calculate how many carbohydrates you should have at each meal and snack. Carbohydrates are found in the following foods:  Grains, such as breads and cereals.  Dried beans and soy products.  Starchy vegetables, such as potatoes, peas, and corn.  Fruit and fruit juices.  Milk and yogurt.  Sweets and snack foods, such as cake, cookies, candy, chips, and soft drinks.  How do I count carbohydrates? There are two ways to count carbohydrates in food. You can use either of the methods or a combination of both. Reading "Nutrition Facts" on packaged food The "Nutrition Facts" list is included on the labels of almost all packaged foods and beverages in the [place] It includes:  The serving size.  Information about nutrients in each serving, including the grams (g) of carbohydrate per serving.  To use the "Nutrition Facts":  Decide how many servings you will have.  Multiply the number of servings by the number of carbohydrates per serving.  The resulting number is the total amount of carbohydrates that you will be having.  Learning standard serving sizes of other foods When you eat foods containing carbohydrates that are not packaged or do not include "Nutrition Facts" on the label, you need to measure the servings in order to count the amount of carbohydrates:  Measure the foods that you will eat with a food scale or  measuring cup, if needed.  Decide how many standard-size servings you will eat.  Multiply the number of servings by 15. Most carbohydrate-rich foods have about 15 g of carbohydrates per serving. ? For example, if you eat 8 oz (170 g) of strawberries, you will have eaten 2 servings and 30 g of carbohydrates (2 servings x 15 g = 30 g).  For foods that have more than one food mixed, such as soups and casseroles, you must count the carbohydrates in each food that is included.  The following list contains standard serving sizes of common carbohydrate-rich foods. Each of these servings has about 15 g of carbohydrates:   hamburger bun or  English muffin.   oz (15 mL) syrup.   oz (14 g) jelly.  1 slice of bread.  1 six-inch tortilla.  3 oz (85 g) cooked rice or pasta.  4 oz (113 g) cooked dried beans.  4 oz (113 g) starchy vegetable, such as peas, corn, or potatoes.  4 oz (113 g) hot cereal.  4 oz (113 g) mashed potatoes or  of a large baked potato.  4 oz (113 g) canned or frozen fruit.  4 oz (120 mL) fruit juice.  4-6 crackers.  6 chicken nuggets.  6 oz (170 g) unsweetened dry cereal.  6 oz (170 g) plain fat-free yogurt or yogurt sweetened with artificial sweeteners.  8 oz (240 mL) milk.  8 oz (170 g) fresh fruit or one small piece of fruit.  24 oz (680 g) popped popcorn.  Example of carbohydrate counting Sample meal  3 oz (85 g) chicken breast.    6 oz (170 g) brown rice.  4 oz (113 g) corn.  8 oz (240 mL) milk.  8 oz (170 g) strawberries with sugar-free whipped topping. Carbohydrate calculation 1. Identify the foods that contain carbohydrates: ? Rice. ? Corn. ? Milk. ? Strawberries. 2. Calculate how many servings you have of each food: ? 2 servings rice. ? 1 serving corn. ? 1 serving milk. ? 1 serving strawberries. 3. Multiply each number of servings by 15 g: ? 2 servings rice x 15 g = 30 g. ? 1 serving corn x 15 g = 15 g. ? 1 serving milk x 15  g = 15 g. ? 1 serving strawberries x 15 g = 15 g. 4. Add together all of the amounts to find the total grams of carbohydrates eaten: ? 30 g + 15 g + 15 g + 15 g = 75 g of carbohydrates total. This information is not intended to replace advice given to you by your health care provider. Make sure you discuss any questions you have with your health care provider. Document Released: 03/11/2005 Document Revised: 09/29/2015 Document Reviewed: 08/23/2015 Elsevier Interactive Patient Education  2018 Else[employer] Human Papillomavirus Quadrivalent Vaccine suspension for injection What is this medicine? HUMAN PAPILLOMAVIRUS VACCINE (HYOO muhn pap uh LOH muh vahy ruhs vak SEEN) is a vaccine. It is used to prevent infections of four types of the human papillomavirus. In women, the vaccine may lower your risk of getting cervical, vaginal, vulvar, or anal cancer and genital warts. In men, the vaccine may lower your risk of getting genital warts and anal cancer. You cannot get these diseases from the vaccine. This vaccine does not treat these diseases. This medicine may be used for other purposes; ask your health care provider or pharmacist if you have questions. COMMON BRAND NAME(S): Gardasil What should I tell my health care provider before I take this medicine? They need to know if you have any of these conditions: -fever or infection -hemophilia -HIV infection or AIDS -immune system problems -low platelet count -an unusual reaction to Human Papillomavirus Vaccine, yeast, other medicines, foods, dyes, or preservatives -pregnant or trying to get pregnant -breast-feeding How should I use this medicine? This vaccine is for injection in a muscle on your upper arm or thigh. It is given by a health care professional. Dennis Bast will be observed for 15 minutes after each dose. Sometimes, fainting happens after the vaccine is given. You may be asked to sit or lie down during the 15 minutes. Three doses are given. The  second dose is given 2 months after the first dose. The last dose is given 4 months after the second dose. A copy of a Vaccine Information Statement will be given before each vaccination. Read this sheet carefully each time. The sheet may change frequently. Talk to your pediatrician regarding the use of this medicine in children. While this drug may be prescribed for children as young as 46 years of age for selected conditions, precautions do apply. Overdosage: If you think you have taken too much of this medicine contact a poison control center or emergency room at once. NOTE: This medicine is only for you. Do not share this medicine with others. What if I miss a dose? All 3 doses of the vaccine should be given within 6 months. Remember to keep appointments for follow-up doses. Your health care provider will tell you when to return for the next vaccine. Ask your health care professional for advice if you are unable  to keep an appointment or miss a scheduled dose. What may interact with this medicine? -other vaccines This list may not describe all possible interactions. Give your health care provider a list of all the medicines, herbs, non-prescription drugs, or dietary supplements you use. Also tell them if you smoke, drink alcohol, or use illegal drugs. Some items may interact with your medicine. What should I watch for while using this medicine? This vaccine may not fully protect everyone. Continue to have regular pelvic exams and cervical or anal cancer screenings as directed by your doctor. The Human Papillomavirus is a sexually transmitted disease. It can be passed by any kind of sexual activity that involves genital contact. The vaccine works best when given before you have any contact with the virus. Many people who have the virus do not have any signs or symptoms. Tell your doctor or health care professional if you have any reaction or unusual symptom after getting the vaccine. What side  effects may I notice from receiving this medicine? Side effects that you should report to your doctor or health care professional as soon as possible: -allergic reactions like skin rash, itching or hives, swelling of the face, lips, or tongue -breathing problems -feeling faint or lightheaded, falls Side effects that usually do not require medical attention (report to your doctor or health care professional if they continue or are bothersome): -cough -dizziness -fever -headache -nausea -redness, warmth, swelling, pain, or itching at site where injected This list may not describe all possible side effects. Call your doctor for medical advice about side effects. You may report side effects to FDA at 1-800-FDA-1088. Where should I keep my medicine? This drug is given in a hospital or clinic and will not be stored at home. NOTE: This sheet is a summary. It may not cover all possible information. If you have questions about this medicine, talk to your doctor, pharmacist, or health care provider.  2018 Elsevier/Gold Standard (2013-05-03 13:14:33) Norethindrone tablets (contraception) What is this medicine? NORETHINDRONE (nor eth IN drone) is an oral contraceptive. The product contains a female hormone known as a progestin. It is used to prevent pregnancy. This medicine may be used for other purposes; ask your health care provider or pharmacist if you have questions. COMMON BRAND NAME(S): Camila, Deblitane 28-Day, Errin, Heather, Gibson, Jolivette, [place], Nor-QD, Nora-BE, Norlyroc, Ortho Micronor, [employer] 28-Day What should I tell my health care provider before I take this medicine? They need to know if you have any of these conditions: -blood vessel disease or blood clots -breast, cervical, or vaginal cancer -diabetes -heart disease -kidney disease -liver disease -mental depression -migraine -seizures -stroke -vaginal bleeding -an unusual or allergic reaction to norethindrone, other  medicines, foods, dyes, or preservatives -pregnant or trying to get pregnant -breast-feeding How should I use this medicine? Take this medicine by mouth with a glass of water. You may take it with or without food. Follow the directions on the prescription label. Take this medicine at the same time each day and in the order directed on the package. Do not take your medicine more often than directed. Contact your pediatrician regarding the use of this medicine in children. Special care may be needed. This medicine has been used in female children who have started having menstrual periods. A patient package insert for the product will be given with each prescription and refill. Read this sheet carefully each time. The sheet may change frequently. Overdosage: If you think you have taken too much of this medicine  contact a poison control center or emergency room at once. NOTE: This medicine is only for you. Do not share this medicine with others. What if I miss a dose? Try not to miss a dose. Every time you miss a dose or take a dose late your chance of pregnancy increases. When 1 pill is missed (even if only 3 hours late), take the missed pill as soon as possible and continue taking a pill each day at the regular time (use a back up method of birth control for the next 48 hours). If more than 1 dose is missed, use an additional birth control method for the rest of your pill pack until menses occurs. Contact your health care professional if more than 1 dose has been missed. What may interact with this medicine? Do not take this medicine with any of the following medications: -amprenavir or fosamprenavir -bosentan This medicine may also interact with the following medications: -antibiotics or medicines for infections, especially rifampin, rifabutin, rifapentine, and griseofulvin, and possibly penicillins or tetracyclines -aprepitant -barbiturate medicines, such as  phenobarbital -carbamazepine -felbamate -modafinil -oxcarbazepine -phenytoin -ritonavir or other medicines for HIV infection or AIDS -St. John's wort -topiramate This list may not describe all possible interactions. Give your health care provider a list of all the medicines, herbs, non-prescription drugs, or dietary supplements you use. Also tell them if you smoke, drink alcohol, or use illegal drugs. Some items may interact with your medicine. What should I watch for while using this medicine? Visit your doctor or health care professional for regular checks on your progress. You will need a regular breast and pelvic exam and Pap smear while on this medicine. Use an additional method of birth control during the first cycle that you take these tablets. If you have any reason to think you are pregnant, stop taking this medicine right away and contact your doctor or health care professional. If you are taking this medicine for hormone related problems, it may take several cycles of use to see improvement in your condition. This medicine does not protect you against HIV infection (AIDS) or any other sexually transmitted diseases. What side effects may I notice from receiving this medicine? Side effects that you should report to your doctor or health care professional as soon as possible: -breast tenderness or discharge -pain in the abdomen, chest, groin or leg -severe headache -skin rash, itching, or hives -sudden shortness of breath -unusually weak or tired -vision or speech problems -yellowing of skin or eyes Side effects that usually do not require medical attention (report to your doctor or health care professional if they continue or are bothersome): -changes in sexual desire -change in menstrual flow -facial hair growth -fluid retention and swelling -headache -irritability -nausea -weight gain or loss This list may not describe all possible side effects. Call your doctor for  medical advice about side effects. You may report side effects to FDA at 1-800-FDA-1088. Where should I keep my medicine? Keep out of the reach of children. Store at room temperature between 15 and 30 degrees C (59 and 86 degrees F). Throw away any unused medicine after the expiration date. NOTE: This sheet is a summary. It may not cover all possible information. If you have questions about this medicine, talk to your doctor, pharmacist, or health care provider.  2018 Elsevier/Gold Standard (2011-11-29 16:41:35)

## 2017-09-26 LAB — PAP IG W/ RFLX HPV ASCU

## 2017-10-01 DIAGNOSIS — F3341 Major depressive disorder, recurrent, in partial remission: Secondary | ICD-10-CM | POA: Diagnosis not present

## 2017-10-08 DIAGNOSIS — F3341 Major depressive disorder, recurrent, in partial remission: Secondary | ICD-10-CM | POA: Diagnosis not present

## 2017-10-15 ENCOUNTER — Encounter: Payer: Self-pay | Admitting: Nurse Practitioner

## 2017-10-15 ENCOUNTER — Other Ambulatory Visit (INDEPENDENT_AMBULATORY_CARE_PROVIDER_SITE_OTHER): Payer: BLUE CROSS/BLUE SHIELD

## 2017-10-15 ENCOUNTER — Ambulatory Visit: Payer: BLUE CROSS/BLUE SHIELD | Admitting: Nurse Practitioner

## 2017-10-15 VITALS — BP 116/80 | HR 93 | Temp 99.6°F | Resp 16 | Ht 69.0 in | Wt 268.1 lb

## 2017-10-15 DIAGNOSIS — I10 Essential (primary) hypertension: Secondary | ICD-10-CM | POA: Diagnosis not present

## 2017-10-15 DIAGNOSIS — F3341 Major depressive disorder, recurrent, in partial remission: Secondary | ICD-10-CM | POA: Diagnosis not present

## 2017-10-15 LAB — BASIC METABOLIC PANEL
BUN: 5 mg/dL — ABNORMAL LOW (ref 6–23)
CHLORIDE: 106 meq/L (ref 96–112)
CO2: 27 mEq/L (ref 19–32)
CREATININE: 0.97 mg/dL (ref 0.40–1.20)
Calcium: 9.5 mg/dL (ref 8.4–10.5)
GFR: 88.14 mL/min (ref >60.00)
GLUCOSE: 98 mg/dL (ref 70–99)
Potassium: 3.6 mEq/L (ref 3.5–5.1)
Sodium: 139 mEq/L (ref 135–145)

## 2017-10-15 NOTE — Patient Instructions (Signed)
Please head downstairs for lab work. If any of your test results are critically abnormal, you will be contacted right away. Otherwise, I will contact you within a week about your test results and any recommendations for abnormalities.  Please try to check your blood pressure once daily or at least a few times a week, at the same time each day, and keep a log.  Please return in about 6 months for blood pressure follow up, or sooner for readings >140/90.   Mediterranean Diet A Mediterranean diet refers to food and lifestyle choices that are based on the traditions of countries located on the [employer]. This way of eating has been shown to help prevent certain conditions and improve outcomes for people who have chronic diseases, like kidney disease and heart disease. What are tips for following this plan? Lifestyle  Cook and eat meals together with your family, when possible.  Drink enough fluid to keep your urine clear or pale yellow.  Be physically active every day. This includes: ? Aerobic exercise like running or swimming. ? Leisure activities like gardening, walking, or housework.  Get 7-8 hours of sleep each night.  If recommended by your health care provider, drink red wine in moderation. This means 1 glass a day for nonpregnant women and 2 glasses a day for men. A glass of wine equals 5 oz (150 mL). Reading food labels  Check the serving size of packaged foods. For foods such as rice and pasta, the serving size refers to the amount of cooked product, not dry.  Check the total fat in packaged foods. Avoid foods that have saturated fat or trans fats.  Check the ingredients list for added sugars, such as corn syrup. Shopping  At the grocery store, buy most of your food from the areas near the walls of the store. This includes: ? Fresh fruits and vegetables (produce). ? Grains, beans, nuts, and seeds. Some of these may be available in unpackaged forms or large amounts (in  bulk). ? Fresh seafood. ? Poultry and eggs. ? Low-fat dairy products.  Buy whole ingredients instead of prepackaged foods.  Buy fresh fruits and vegetables in-season from local farmers markets.  Buy frozen fruits and vegetables in resealable bags.  If you do not have access to quality fresh seafood, buy precooked frozen shrimp or canned fish, such as tuna, salmon, or sardines.  Buy small amounts of raw or cooked vegetables, salads, or olives from the deli or salad bar at your store.  Stock your pantry so you always have certain foods on hand, such as olive oil, canned tuna, canned tomatoes, rice, pasta, and beans. Cooking  Cook foods with extra-virgin olive oil instead of using butter or other vegetable oils.  Have meat as a side dish, and have vegetables or grains as your main dish. This means having meat in small portions or adding small amounts of meat to foods like pasta or stew.  Use beans or vegetables instead of meat in common dishes like chili or lasagna.  Experiment with different cooking methods. Try roasting or broiling vegetables instead of steaming or sauteing them.  Add frozen vegetables to soups, stews, pasta, or rice.  Add nuts or seeds for added healthy fat at each meal. You can add these to yogurt, salads, or vegetable dishes.  Marinate fish or vegetables using olive oil, lemon juice, garlic, and fresh herbs. Meal planning  Plan to eat 1 vegetarian meal one day each week. Try to work up to 2  vegetarian meals, if possible.  Eat seafood 2 or more times a week.  Have healthy snacks readily available, such as: ? Vegetable sticks with hummus. ? [place] yogurt. ? Fruit and nut trail mix.  Eat balanced meals throughout the week. This includes: ? Fruit: 2-3 servings a day ? Vegetables: 4-5 servings a day ? Low-fat dairy: 2 servings a day ? Fish, poultry, or lean meat: 1 serving a day ? Beans and legumes: 2 or more servings a week ? Nuts and seeds: 1-2 servings  a day ? Whole grains: 6-8 servings a day ? Extra-virgin olive oil: 3-4 servings a day  Limit red meat and sweets to only a few servings a month What are my food choices?  Mediterranean diet ? Recommended ? Grains: Whole-grain pasta. Brown rice. Bulgar wheat. Polenta. Couscous. Whole-wheat bread. [place]. ? Vegetables: Artichokes. Beets. Broccoli. Cabbage. Carrots. Eggplant. Green beans. Chard. Kale. Spinach. Onions. Leeks. Peas. Squash. Tomatoes. Peppers. Radishes. ? Fruits: Apples. Apricots. Avocado. Berries. Bananas. Cherries. Dates. Figs. Grapes. Lemons. Melon. Oranges. Peaches. Plums. Pomegranate. ? Meats and other protein foods: Beans. Almonds. Sunflower seeds. Pine nuts. Peanuts. [place]. Salmon. Scallops. Shrimp. [place]. Tilapia. Clams. Oysters. Eggs. ? Dairy: Low-fat milk. Cheese. Greek yogurt. ? Beverages: Water. Red wine. Herbal tea. ? Fats and oils: Extra virgin olive oil. Avocado oil. Grape seed oil. ? Sweets and desserts: [place] yogurt with honey. Baked apples. Poached pears. Trail mix. ? Seasoning and other foods: Basil. Cilantro. Coriander. Cumin. Mint. Parsley. Sage. Rosemary. Tarragon. Garlic. Oregano. Thyme. Pepper. Balsalmic vinegar. Tahini. Hummus. Tomato sauce. Olives. Mushrooms. ? Limit these ? Grains: Prepackaged pasta or rice dishes. Prepackaged cereal with added sugar. ? Vegetables: Deep fried potatoes (french fries). ? Fruits: Fruit canned in syrup. ? Meats and other protein foods: Beef. Pork. Lamb. Poultry with skin. Hot dogs. Berniece Salines. ? Dairy: Ice cream. Sour cream. Whole milk. ? Beverages: Juice. Sugar-sweetened soft drinks. Beer. Liquor and spirits. ? Fats and oils: Butter. Canola oil. Vegetable oil. Beef fat (tallow). Lard. ? Sweets and desserts: Cookies. Cakes. Pies. Candy. ? Seasoning and other foods: Mayonnaise. Premade sauces and marinades. ? The items listed may not be a complete list. Talk with your dietitian about what dietary choices are right for  you. Summary  The Mediterranean diet includes both food and lifestyle choices.  Eat a variety of fresh fruits and vegetables, beans, nuts, seeds, and whole grains.  Limit the amount of red meat and sweets that you eat.  Talk with your health care provider about whether it is safe for you to drink red wine in moderation. This means 1 glass a day for nonpregnant women and 2 glasses a day for men. A glass of wine equals 5 oz (150 mL). This information is not intended to replace advice given to you by your health care provider. Make sure you discuss any questions you have with your health care provider. Document Released: 11/02/2015 Document Revised: 12/05/2015 Document Reviewed: 11/02/2015 Elsevier Interactive Patient Education  [employer].

## 2017-10-15 NOTE — Progress Notes (Signed)
Name: Shannon [name]   MRN: [id number]    DOB: [birth year]   Date:10/15/2017       Progress Note  Subjective  Chief Complaint  Chief Complaint  Patient presents with  . Follow-up    hypertension    HPI  She was instructed to restart losartan 25 daily at last OV on 6/19 due to increased BP readings. Reports she has resumed losartan as instructed without noted adverse medication effects. She has continued to routinely check BP readings at home with readings 120s-130s/70s-80s back on losartan Overall feels well Has been working on improving her diet as well, eating fresh vegetables and fruits and cutting out sugars to try to lose weight and improve her health-she is now on the wait list for weight management  Denies headaches, vision changes, chest pain, shortness of breath, edema.  BP Readings from Last 3 Encounters:  10/15/17 116/80  09/24/17 130/82  09/10/17 134/88   Wt Readings from Last 3 Encounters:  10/15/17 268 lb 1.9 oz (121.6 kg)  09/24/17 273 lb 9.6 oz (124.1 kg)  09/10/17 286 lb (129.7 kg)    Patient Active Problem List   Diagnosis Date Noted  . Morbid obesity ([place]) 08/11/2017  . Routine general medical examination at a health care facility 08/10/2017  . Hypertension 12/30/2016  . Slurred speech 12/30/2016  . Iron deficiency anemia 09/24/2016  . Fatigue 08/06/2016  . Pulmonary embolism with acute cor pulmonale ([place]) 07/04/2016  . Hypokalemia 07/04/2016  . Prolonged QT interval 07/04/2016  . Depression 07/04/2016  . Adjustment disorder with emotional disturbance 03/31/2016  . Major depressive disorder, recurrent severe without psychotic features ([place]) 01/10/2016  . Major depressive disorder, recurrent episode, mild ([place]) 01/01/2016  . Suicide ideation 02/16/2013    Past Surgical History:  Procedure Laterality Date  . NO PAST SURGERIES    . TEETH REMOVAL  07/2017    Family History  Problem Relation Age of Onset  . Healthy Mother   . Healthy Father   .  Pulmonary embolism Neg Hx   . Deep vein thrombosis Neg Hx     Social History   Socioeconomic History  . Marital status: Single    Spouse name: Not on file  . Number of children: 0  . Years of education: 64  . Highest education level: Not on file  Occupational History  . Occupation: Teacher, English as a foreign language  . Financial resource strain: Not on file  . Food insecurity:    Worry: Not on file    Inability: Not on file  . Transportation needs:    Medical: Not on file    Non-medical: Not on file  Tobacco Use  . Smoking status: Never Smoker  . Smokeless tobacco: Never Used  Substance and Sexual Activity  . Alcohol use: No  . Drug use: No  . Sexual activity: Never    Birth control/protection: None, Abstinence    Comment: VIRGIN  Lifestyle  . Physical activity:    Days per week: Not on file    Minutes per session: Not on file  . Stress: Not on file  Relationships  . Social connections:    Talks on phone: Not on file    Gets together: Not on file    Attends religious service: Not on file    Active member of club or organization: Not on file    Attends meetings of clubs or organizations: Not on file    Relationship status: Not on file  . Intimate partner violence:  Fear of current or ex partner: Not on file    Emotionally abused: Not on file    Physically abused: Not on file    Forced sexual activity: Not on file  Other Topics Concern  . Not on file  Social History Narrative   Fun/Hobby: Going to the gym, writing.     Current Outpatient Medications:  .  buPROPion (WELLBUTRIN) 100 MG tablet, Take 100 mg by mouth 2 (two) times daily., Disp: , Rfl:  .  Ferrous Sulfate (IRON) 325 (65 Fe) MG TABS, Taking 1 tab daily, Disp: 30 each, Rfl: 0 .  losartan (COZAAR) 25 MG tablet, Take 1 tablet (25 mg total) by mouth daily., Disp: 30 tablet, Rfl: 5 .  Multiple Vitamin (MULTIVITAMIN WITH MINERALS) TABS tablet, Take 1 tablet by mouth daily., Disp: , Rfl:  .  norethindrone (ORTHO  MICRONOR) 0.35 MG tablet, Take 1 tablet (0.35 mg total) by mouth daily., Disp: 3 Package, Rfl: 4  Allergies  Allergen Reactions  . Bupropion Other (See Comments)    Reaction:  Constipation, states just the extended release     ROS See HPI  Objective  Vitals:   10/15/17 1555  BP: 116/80  Pulse: 93  Resp: 16  Temp: 99.6 F (37.6 C)  TempSrc: Oral  SpO2: 98%  Weight: 268 lb 1.9 oz (121.6 kg)  Height: 5\' 9"  (1.753 m)    Body mass index is 39.59 kg/m.  Physical Exam Vital signs reviewed. Constitutional: Patient appears well-developed and well-nourished.No distress.  HENT: Head: Normocephalic and atraumatic; Nose: Nose normal. Mouth/Throat: Oropharynx is clear and moist. No oropharyngeal exudate.  Eyes: Conjunctivae and EOM are normal. Pupils are equal, round, and reactive to light. No scleral icterus.  Neck: Normal range of motion. Neck supple. Cardiovascular: Normal rate, regular rhythm and normal heart sounds. No murmur heard.Distal pulses intact. Pulmonary/Chest: Effort normal and breath sounds normal. No respiratory distress. Neurological:She is alert and oriented to person, place, and time. No cranial nerve deficit. Coordination, balance, strength, speech and gait are normal.  Skin: Skin is warm and dry. No rash noted. No erythema.  Psychiatric: Patient has a normal mood and affect. behavior is normal. Judgment and thought content normal.   Assessment & Plan RTC in 6 months for F/U: HTN- recheck BP  -Reviewed Health Maintenance: up to date

## 2017-10-15 NOTE — Assessment & Plan Note (Signed)
Encouraged to continue healthy diet and exercise- additional information provided on AVS Continue with weight management followup

## 2017-10-15 NOTE — Assessment & Plan Note (Signed)
BP appears stable Continue losartan at current dosage Update labs Continue to monitor BP readings at home RTC in 6 months for F/U, sooner for readings >719/59 - Basic metabolic panel; Future

## 2017-10-22 DIAGNOSIS — F3341 Major depressive disorder, recurrent, in partial remission: Secondary | ICD-10-CM | POA: Diagnosis not present

## 2017-10-29 DIAGNOSIS — F3341 Major depressive disorder, recurrent, in partial remission: Secondary | ICD-10-CM | POA: Diagnosis not present

## 2017-11-05 DIAGNOSIS — F3341 Major depressive disorder, recurrent, in partial remission: Secondary | ICD-10-CM | POA: Diagnosis not present

## 2017-11-10 ENCOUNTER — Telehealth: Payer: Self-pay | Admitting: Nurse Practitioner

## 2017-11-10 MED ORDER — LOSARTAN POTASSIUM 25 MG PO TABS: 25.0000 mg | ORAL_TABLET | Freq: Every day | ORAL | 5 refills | Status: DC

## 2017-11-10 NOTE — Telephone Encounter (Signed)
Copied from [place] [id number]. Topic: Quick Communication - Rx Refill/Question >> Nov 10, 2017  9:59 AM Marin Olp L wrote: Medication: losartan (COZAAR) 25 MG tablet  Has the patient contacted their pharmacy? Yes.   (Agent: If no, request that the patient contact the pharmacy for the refill.) (Agent: If yes, when and what did the pharmacy advise?)  Preferred Pharmacy (with phone number or street name): [facility] [place], [place] - [address] [place] [address] Phone: [phone] Fax: [phone]    Agent: Please be advised that RX refills may take up to 3 business days. We ask that you follow-up with your pharmacy.

## 2017-11-12 DIAGNOSIS — F3341 Major depressive disorder, recurrent, in partial remission: Secondary | ICD-10-CM | POA: Diagnosis not present

## 2017-11-19 DIAGNOSIS — F332 Major depressive disorder, recurrent severe without psychotic features: Secondary | ICD-10-CM | POA: Diagnosis not present

## 2017-11-19 DIAGNOSIS — F3341 Major depressive disorder, recurrent, in partial remission: Secondary | ICD-10-CM | POA: Diagnosis not present

## 2017-11-25 DIAGNOSIS — F3341 Major depressive disorder, recurrent, in partial remission: Secondary | ICD-10-CM | POA: Diagnosis not present

## 2017-11-26 ENCOUNTER — Ambulatory Visit (INDEPENDENT_AMBULATORY_CARE_PROVIDER_SITE_OTHER): Payer: BLUE CROSS/BLUE SHIELD | Admitting: *Deleted

## 2017-11-26 DIAGNOSIS — Z23 Encounter for immunization: Secondary | ICD-10-CM

## 2017-12-03 DIAGNOSIS — F3341 Major depressive disorder, recurrent, in partial remission: Secondary | ICD-10-CM | POA: Diagnosis not present

## 2017-12-10 DIAGNOSIS — F3341 Major depressive disorder, recurrent, in partial remission: Secondary | ICD-10-CM | POA: Diagnosis not present

## 2017-12-17 DIAGNOSIS — F3341 Major depressive disorder, recurrent, in partial remission: Secondary | ICD-10-CM | POA: Diagnosis not present

## 2017-12-18 ENCOUNTER — Encounter (INDEPENDENT_AMBULATORY_CARE_PROVIDER_SITE_OTHER): Payer: BLUE CROSS/BLUE SHIELD

## 2017-12-24 DIAGNOSIS — F3341 Major depressive disorder, recurrent, in partial remission: Secondary | ICD-10-CM | POA: Diagnosis not present

## 2017-12-29 ENCOUNTER — Encounter (INDEPENDENT_AMBULATORY_CARE_PROVIDER_SITE_OTHER): Payer: Self-pay | Admitting: Family Medicine

## 2017-12-29 ENCOUNTER — Ambulatory Visit (INDEPENDENT_AMBULATORY_CARE_PROVIDER_SITE_OTHER): Payer: BLUE CROSS/BLUE SHIELD | Admitting: Family Medicine

## 2017-12-29 VITALS — BP 117/80 | HR 79 | Ht 69.0 in | Wt 263.0 lb

## 2017-12-29 DIAGNOSIS — R0602 Shortness of breath: Secondary | ICD-10-CM | POA: Diagnosis not present

## 2017-12-29 DIAGNOSIS — D508 Other iron deficiency anemias: Secondary | ICD-10-CM | POA: Diagnosis not present

## 2017-12-29 DIAGNOSIS — R5383 Other fatigue: Secondary | ICD-10-CM

## 2017-12-29 DIAGNOSIS — Z6838 Body mass index (BMI) 38.0-38.9, adult: Secondary | ICD-10-CM

## 2017-12-29 DIAGNOSIS — F418 Other specified anxiety disorders: Secondary | ICD-10-CM

## 2017-12-29 DIAGNOSIS — Z9189 Other specified personal risk factors, not elsewhere classified: Secondary | ICD-10-CM

## 2017-12-29 DIAGNOSIS — I10 Essential (primary) hypertension: Secondary | ICD-10-CM | POA: Diagnosis not present

## 2017-12-29 DIAGNOSIS — Z0289 Encounter for other administrative examinations: Secondary | ICD-10-CM

## 2017-12-29 DIAGNOSIS — E559 Vitamin D deficiency, unspecified: Secondary | ICD-10-CM | POA: Diagnosis not present

## 2017-12-29 NOTE — Progress Notes (Signed)
Office: [phone]  /  Fax: [phone]   Dear Lance Sell, NP,   Thank you for referring Shannon [name] to our clinic. The following note includes my evaluation and treatment recommendations.  HPI:   Chief Complaint: OBESITY    Shannon [name] has been referred by Lance Sell, NP for consultation regarding her obesity and obesity related comorbidities.    Shannon [name] (MR# [id number]) is a 27 y.o. female who presents on 12/29/2017 for obesity evaluation and treatment. Current BMI is Body mass index is 38.84 kg/m.Shannon [name] has been struggling with her weight for many years and has been unsuccessful in either losing weight, maintaining weight loss, or reaching her healthy weight goal.     Shannon [name] attended our information session and states she is currently in the action stage of change and ready to dedicate time achieving and maintaining a healthier weight. Shannon [name] is interested in becoming our patient and working on intensive lifestyle modifications including (but not limited to) diet, exercise and weight loss.    Shannon [name] states she struggles with family and or coworkers weight loss sabotage her desired weight loss is 84.4 she has been heavy most of  her life she started gaining weight at 27 yrs old her heaviest weight ever was 310 lbs. she is a picky eater and doesn't like to eat healthier foods  she has significant food cravings issues  she snacks frequently in the evenings she skips meals frequently she is frequently drinking liquids with calories she frequently makes poor food choices she has problems with excessive hunger  she frequently eats larger portions than normal  she struggles with emotional eating    Fatigue [name] feels her energy is lower than it should be. This has worsened with weight gain and has not worsened recently. [name] admits to daytime somnolence and admits to waking up still tired. Patient is at risk for obstructive sleep apnea. Patent has a  history of symptoms of daytime fatigue, morning fatigue and hypertension. Patient generally gets 6 to 8 hours of sleep per night, and states they generally have restful sleep. Snoring is present. Apneic episodes are not present. Epworth Sleepiness Score is 3  Dyspnea on exertion Gerrianne notes increasing shortness of breath with exercising and seems to be worsening over time with weight gain. She notes getting out of breath sooner with activity than she used to. This has not gotten worse recently. Shannon [name] denies orthopnea.  Hypertension Shannon [name] is a 27 y.o. female with hypertension. She does have a history of hypokalemia Shannon [name] denies chest pain or dizziness. She is attempting to work on  weight loss to help control her blood pressure with the goal of decreasing her risk of heart attack and stroke. Maggies blood pressure is stable on medications.  At risk for cardiovascular disease Shannon [name] is at a higher than average risk for cardiovascular disease due to obesity and hypertension. She currently denies any chest pain.  Vitamin D deficiency Shannon [name] has a diagnosis of vitamin D deficiency. Shannon [name] is not currently taking vit D and she admits fatigue and denies nausea, vomiting or muscle weakness.  Iron Deficiency Anemia Shannon [name] has a diagnosis of anemia.  She notes fatigue and denies palpitations. Shannon [name] is not on iron supplementation.   Depression with Anxiety Shannon [name] is on Wellbutrin and her PHQ-9 is high at 84 with patient stating that she feels her weight makes her life not worth living > 50% of the time. Shannon [name] struggles with emotional eating and using food for  comfort to the extent that it is negatively impacting her health. She often snacks when she is not hungry. Shannon [name] sometimes feels she is out of control and then feels guilty that she made poor food choices. She is attempting to work on behavior modification techniques to help reduce her emotional eating. She shows no sign of suicidal  or homicidal ideations.  Depression Screen Shannon [name]'s Food and Mood (modified PHQ-9) score was  Depression screen PHQ 2/9 12/29/2017  Decreased Interest 1  Down, Depressed, Hopeless 3  PHQ - 2 Score 4  Altered sleeping 2  Tired, decreased energy 2  Change in appetite 3  Feeling bad or failure about yourself  3  Trouble concentrating 3  Moving slowly or fidgety/restless 2  Suicidal thoughts 2  PHQ-9 Score 21  Difficult doing work/chores Somewhat difficult    ALLERGIES: Allergies  Allergen Reactions  . Bupropion Other (See Comments)    Reaction:  Constipation, states just the extended release    MEDICATIONS: Current Outpatient Medications on File Prior to Visit  Medication Sig Dispense Refill  . buPROPion (WELLBUTRIN) 100 MG tablet Take 100 mg by mouth 2 (two) times daily.    [name] losartan (COZAAR) 25 MG tablet Take 1 tablet (25 mg total) by mouth daily. 30 tablet 5  . norethindrone (ORTHO MICRONOR) 0.35 MG tablet Take 1 tablet (0.35 mg total) by mouth daily. 3 Package 4   No current facility-administered medications on file prior to visit.     PAST MEDICAL HISTORY: Past Medical History:  Diagnosis Date  . Anemia   . Anxiety   . Constipation   . Depression   . Dyspnea   . HTN (hypertension)   . Knee pain   . Leg edema   . Medical history non-contributory   . Obesity   . Pain in both feet   . Prolonged QT interval   . Pulmonary thromboembolism ([place])   . Suicide ideation     PAST SURGICAL HISTORY: Past Surgical History:  Procedure Laterality Date  . NO PAST SURGERIES    . TEETH REMOVAL  07/2017    SOCIAL HISTORY: Social History   Tobacco Use  . Smoking status: Never Smoker  . Smokeless tobacco: Never Used  Substance Use Topics  . Alcohol use: No  . Drug use: No    FAMILY HISTORY: Family History  Problem Relation Age of Onset  . Healthy Mother   . Hypertension Mother   . Obesity Mother   . Healthy Father   . Pulmonary embolism Neg Hx   . Deep vein  thrombosis Neg Hx     ROS: Review of Systems  Constitutional: Positive for malaise/fatigue.       + Fever/Chills   Eyes:       + Vision Changes + Wear Glasses or Contacts  Cardiovascular: Negative for chest pain, palpitations and orthopnea.  Gastrointestinal: Negative for nausea and vomiting.  Musculoskeletal:       Negative for muscle weakness  Neurological: Positive for weakness. Negative for dizziness.  Psychiatric/Behavioral: Positive for depression. Negative for suicidal ideas. The patient is nervous/anxious (nervousness).        + Stress     PHYSICAL EXAM: Blood pressure 117/80, pulse 79, height 5\' 9"  (1.753 m), weight 263 lb (119.3 kg), last menstrual period 12/27/2017, SpO2 100 %. Body mass index is 38.84 kg/m. Physical Exam  Constitutional: She is oriented to person, place, and time. She appears well-developed and well-nourished.  HENT:  Head: Normocephalic and atraumatic.  Nose: Nose normal.  Eyes: EOM are normal. No scleral icterus.  Neck: Normal range of motion. Neck supple. No thyromegaly present.  Cardiovascular: Normal rate and regular rhythm.  Pulmonary/Chest: Effort normal. No respiratory distress.  Abdominal: Soft. There is no tenderness.  + Obesity  Musculoskeletal: Normal range of motion.  Range of Motion normal in all 4 extremities  Neurological: She is alert and oriented to person, place, and time. Coordination normal.  Skin: Skin is warm and dry.  Psychiatric: She has a normal mood and affect. Her behavior is normal.  Vitals reviewed.   RECENT LABS AND TESTS: BMET    Component Value Date/Time   NA 139 10/15/2017 1635   K 3.6 10/15/2017 1635   CL 106 10/15/2017 1635   CO2 27 10/15/2017 1635   GLUCOSE 98 10/15/2017 1635   BUN 5 (L) 10/15/2017 1635   CREATININE 0.97 10/15/2017 1635   CALCIUM 9.5 10/15/2017 1635   GFRNONAA >60 07/06/2016 0536   GFRAA >60 07/06/2016 0536   Lab Results  Component Value Date   HGBA1C 5.8 08/07/2017   No  results found for: INSULIN CBC    Component Value Date/Time   WBC 5.7 08/07/2017 0938   RBC 4.90 08/07/2017 0938   HGB 12.9 08/07/2017 0938   HCT 39.6 08/07/2017 0938   PLT 354.0 08/07/2017 0938   MCV 80.7 08/07/2017 0938   MCH 24.5 (L) 07/06/2016 0536   MCHC 32.7 08/07/2017 0938   RDW 14.3 08/07/2017 0938   Iron/TIBC/Ferritin/ %Sat    Component Value Date/Time   IRON 46 08/07/2017 0938   FERRITIN 14.0 08/07/2017 0938   IRONPCTSAT 12.8 (L) 08/07/2017 0938   Lipid Panel     Component Value Date/Time   CHOL 234 (H) 08/07/2017 0938   TRIG 110.0 08/07/2017 0938   HDL 42.00 08/07/2017 0938   CHOLHDL 6 08/07/2017 0938   VLDL 22.0 08/07/2017 0938   LDLCALC 170 (H) 08/07/2017 0938   Hepatic Function Panel     Component Value Date/Time   PROT 7.7 08/07/2017 0938   ALBUMIN 4.1 08/07/2017 0938   AST 14 08/07/2017 0938   ALT 10 08/07/2017 0938   ALKPHOS 106 08/07/2017 0938   BILITOT 0.8 08/07/2017 0938      Component Value Date/Time   TSH 1.16 08/07/2017 0938   TSH 1.69 08/06/2016 0855    ECG  shows NSR with a rate of 88 BPM INDIRECT CALORIMETER done today shows a VO2 of 244 and a REE of 1700.  Her calculated basal metabolic rate is 7106 thus her basal metabolic rate is worse than expected.    ASSESSMENT AND PLAN: Other fatigue - Plan: EKG 12-Lead, Vitamin B12, Comprehensive metabolic panel, Folate, Hemoglobin A1c, Insulin, random, Lipid Panel With LDL/HDL Ratio, T3, T4, free, TSH  Shortness of breath on exertion  Essential hypertension  Vitamin D deficiency - Plan: VITAMIN D 25 Hydroxy (Vit-D Deficiency, Fractures)  Other iron deficiency anemia - Plan: CBC With Differential  Depression with anxiety  At risk for heart disease  Class 2 severe obesity with serious comorbidity and body mass index (BMI) of 38.0 to 38.9 in adult, unspecified obesity type [facility])  PLAN: Fatigue Ashiah was informed that her fatigue may be related to obesity, depression or many other  causes. Labs will be ordered, and in the meanwhile Emmett has agreed to work on diet, exercise and weight loss to help with fatigue. Proper sleep hygiene was discussed including the need for 7-8 hours of quality sleep each night.  A sleep study was not ordered based on symptoms and Epworth score.  Dyspnea on exertion Mixtli's shortness of breath appears to be obesity related and exercise induced. She has agreed to work on weight loss and gradually increase exercise to treat her exercise induced shortness of breath. If Danayah follows our instructions and loses weight without improvement of her shortness of breath, we will plan to refer to pulmonology. We will monitor this condition regularly. Carlia agrees to this plan.  Hypertension We discussed sodium restriction, working on healthy weight loss, and a regular exercise program as the means to achieve improved blood pressure control. Katilynn agreed with this plan and agreed to follow up as directed. We will continue to monitor her blood pressure as well as her progress with the above lifestyle modifications. We will check labs and follow. She will continue her medications as prescribed and will watch for signs of hypotension as she continues her lifestyle modifications.  Cardiovascular risk counseling Rifky was given extended (15 minutes) coronary artery disease prevention counseling today. She is 27 y.o. female and has risk factors for heart disease including obesity and hypertension. We discussed intensive lifestyle modifications today with an emphasis on specific weight loss instructions and strategies. Pt was also informed of the importance of increasing exercise and decreasing saturated fats to help prevent heart disease.  Vitamin D Deficiency Darleene was informed that low vitamin D levels contributes to fatigue and are associated with obesity, breast, and colon cancer. We will check labs and Katelee will follow up for routine testing of vitamin D, at  least 2-3 times per year. Konica agrees to follow up with our clinic in 2 weeks.  Iron Deficiency Anemia The diagnosis of Iron deficiency anemia was discussed with Bridney and was explained in detail. She was given suggestions of iron rich foods and iron supplement was not prescribed. We will check labs and Melitta will follow up with our clinic in 2 weeks.  Depression with Anxiety We discussed behavior modification techniques today to help Murline deal with her emotional eating and depression. She agrees to continue her medications and we will refer to Dr. Mallie Mussel our bariatric psychologist. Shannon [name] agreed to follow up as directed.  Depression Screen Erionna had a strongly positive depression screening. Depression is commonly associated with obesity and often results in emotional eating behaviors. We will monitor this closely and work on CBT to help improve the non-hunger eating patterns. Referral to Psychology may be required if no improvement is seen as she continues in our clinic.  Obesity Toshia is currently in the action stage of change and her goal is to continue with weight loss efforts. I recommend Talin begin the structured treatment plan as follows:  She has agreed to follow the Category 3 plan Atley has been instructed to eventually work up to a goal of 150 minutes of combined cardio and strengthening exercise per week for weight loss and overall health benefits. We discussed the following Behavioral Modification Strategies today: increasing lean protein intake, decreasing simple carbohydrates , decrease eating out and work on meal planning and easy cooking plans   She was informed of the importance of frequent follow up visits to maximize her success with intensive lifestyle modifications for her multiple health conditions. She was informed we would discuss her lab results at her next visit unless there is a critical issue that needs to be addressed sooner. Giavanni agreed to keep her next  visit at the agreed upon time to discuss these results.  OBESITY BEHAVIORAL INTERVENTION VISIT  Today's visit was # 1   Starting weight: 263 lbs Starting date: 12/29/17 Today's weight : 263 lbs  Today's date: 12/29/2017 Total lbs lost to date: 0   ASK: We discussed the diagnosis of obesity with Marcene Brawn today and Taneka agreed to give [place] permission to discuss obesity behavioral modification therapy today.  ASSESS: Anaelle has the diagnosis of obesity and her BMI today is 67.82 Dede is in the action stage of change   ADVISE: Colene was educated on the multiple health risks of obesity as well as the benefit of weight loss to improve her health. She was advised of the need for long term treatment and the importance of lifestyle modifications to improve her current health and to decrease her risk of future health problems.  AGREE: Multiple dietary modification options and treatment options were discussed and  Meerab agreed to follow the recommendations documented in the above note.  ARRANGE: Denell was educated on the importance of frequent visits to treat obesity as outlined per CMS and USPSTF guidelines and agreed to schedule her next follow up appointment today.  I, Doreene Nest, am acting as transcriptionist for Dennard Nip, MD  I have reviewed the above documentation for accuracy and completeness, and I agree with the above. -Dennard Nip, MD
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
## 2017-12-30 LAB — HEMOGLOBIN A1C
Est. average glucose Bld gHb Est-mCnc: 111 mg/dL
Hgb A1c MFr Bld: 5.5 % (ref 4.8–5.6)

## 2017-12-30 LAB — COMPREHENSIVE METABOLIC PANEL
ALBUMIN: 4.5 g/dL (ref 3.5–5.5)
ALK PHOS: 128 IU/L — AB (ref 39–117)
ALT: 10 IU/L (ref 0–32)
AST: 14 IU/L (ref 0–40)
Albumin/Globulin Ratio: 1.6 (ref 1.2–2.2)
BUN / CREAT RATIO: 7 — AB (ref 9–23)
BUN: 7 mg/dL (ref 6–20)
Bilirubin Total: 0.6 mg/dL (ref 0.0–1.2)
CO2: 23 mmol/L (ref 20–29)
CREATININE: 0.96 mg/dL (ref 0.57–1.00)
Calcium: 9.3 mg/dL (ref 8.7–10.2)
Chloride: 102 mmol/L (ref 96–106)
GFR calc non Af Amer: 81 mL/min/{1.73_m2} (ref >59)
GFR, EST AFRICAN AMERICAN: 94 mL/min/{1.73_m2} (ref >59)
GLOBULIN, TOTAL: 2.9 g/dL (ref 1.5–4.5)
GLUCOSE: 84 mg/dL (ref 65–99)
Potassium: 4 mmol/L (ref 3.5–5.2)
SODIUM: 139 mmol/L (ref 134–144)
Total Protein: 7.4 g/dL (ref 6.0–8.5)

## 2017-12-30 LAB — ANEMIA PANEL
FOLATE, HEMOLYSATE: 381.1 ng/mL
Ferritin: 12 ng/mL — ABNORMAL LOW (ref 15–150)
Folate, RBC: 1006 ng/mL (ref >498)
Hematocrit: 37.9 % (ref 34.0–46.6)
IRON SATURATION: 9 % — AB (ref 15–55)
IRON: 30 ug/dL (ref 27–159)
RETIC CT PCT: 0.9 % (ref 0.6–2.6)
Total Iron Binding Capacity: 334 ug/dL (ref 250–450)
UIBC: 304 ug/dL (ref 131–425)
VITAMIN B 12: 388 pg/mL (ref 232–1245)

## 2017-12-30 LAB — FOLATE: FOLATE: 13.1 ng/mL (ref >3.0)

## 2017-12-30 LAB — CBC WITH DIFFERENTIAL
Basophils Absolute: 0.1 10*3/uL (ref 0.0–0.2)
Basos: 1 %
EOS (ABSOLUTE): 0.2 10*3/uL (ref 0.0–0.4)
EOS: 3 %
HEMOGLOBIN: 12.2 g/dL (ref 11.1–15.9)
IMMATURE GRANULOCYTES: 0 %
Immature Grans (Abs): 0 10*3/uL (ref 0.0–0.1)
LYMPHS ABS: 1.9 10*3/uL (ref 0.7–3.1)
Lymphs: 38 %
MCH: 25.8 pg — ABNORMAL LOW (ref 26.6–33.0)
MCHC: 32.2 g/dL (ref 31.5–35.7)
MCV: 80 fL (ref 79–97)
MONOCYTES: 7 %
Monocytes Absolute: 0.3 10*3/uL (ref 0.1–0.9)
NEUTROS ABS: 2.6 10*3/uL (ref 1.4–7.0)
Neutrophils: 51 %
RBC: 4.72 x10E6/uL (ref 3.77–5.28)
RDW: 13.1 % (ref 12.3–15.4)
WBC: 5.1 10*3/uL (ref 3.4–10.8)

## 2017-12-30 LAB — T4, FREE: Free T4: 1.22 ng/dL (ref 0.82–1.77)

## 2017-12-30 LAB — LIPID PANEL WITH LDL/HDL RATIO
CHOLESTEROL TOTAL: 235 mg/dL — AB (ref 100–199)
HDL: 45 mg/dL (ref >39)
LDL CALC: 178 mg/dL — AB (ref 0–99)
LDl/HDL Ratio: 4 ratio — ABNORMAL HIGH (ref 0.0–3.2)
TRIGLYCERIDES: 58 mg/dL (ref 0–149)
VLDL CHOLESTEROL CAL: 12 mg/dL (ref 5–40)

## 2017-12-30 LAB — VITAMIN D 25 HYDROXY (VIT D DEFICIENCY, FRACTURES): Vit D, 25-Hydroxy: 12.7 ng/mL — ABNORMAL LOW (ref 30.0–100.0)

## 2017-12-30 LAB — INSULIN, RANDOM: INSULIN: 21.8 u[IU]/mL (ref 2.6–24.9)

## 2017-12-30 LAB — TSH: TSH: 0.792 u[IU]/mL (ref 0.450–4.500)

## 2017-12-30 LAB — T3: T3, Total: 130 ng/dL (ref 71–180)

## 2017-12-31 DIAGNOSIS — F3341 Major depressive disorder, recurrent, in partial remission: Secondary | ICD-10-CM | POA: Diagnosis not present

## 2018-01-07 DIAGNOSIS — F3341 Major depressive disorder, recurrent, in partial remission: Secondary | ICD-10-CM | POA: Diagnosis not present

## 2018-01-12 ENCOUNTER — Ambulatory Visit (INDEPENDENT_AMBULATORY_CARE_PROVIDER_SITE_OTHER): Payer: BLUE CROSS/BLUE SHIELD | Admitting: Psychology

## 2018-01-12 ENCOUNTER — Ambulatory Visit (INDEPENDENT_AMBULATORY_CARE_PROVIDER_SITE_OTHER): Payer: BLUE CROSS/BLUE SHIELD | Admitting: Family Medicine

## 2018-01-12 VITALS — BP 119/78 | HR 77 | Temp 98.1°F | Ht 69.0 in | Wt 258.0 lb

## 2018-01-12 DIAGNOSIS — E559 Vitamin D deficiency, unspecified: Secondary | ICD-10-CM

## 2018-01-12 DIAGNOSIS — Z9189 Other specified personal risk factors, not elsewhere classified: Secondary | ICD-10-CM | POA: Diagnosis not present

## 2018-01-12 DIAGNOSIS — E7849 Other hyperlipidemia: Secondary | ICD-10-CM

## 2018-01-12 DIAGNOSIS — R7303 Prediabetes: Secondary | ICD-10-CM | POA: Diagnosis not present

## 2018-01-12 DIAGNOSIS — Z6838 Body mass index (BMI) 38.0-38.9, adult: Secondary | ICD-10-CM

## 2018-01-12 DIAGNOSIS — F331 Major depressive disorder, recurrent, moderate: Secondary | ICD-10-CM | POA: Diagnosis not present

## 2018-01-12 MED ORDER — METFORMIN HCL 500 MG PO TABS: 500.0000 mg | ORAL_TABLET | Freq: Every day | ORAL | 0 refills | Status: DC

## 2018-01-12 MED ORDER — VITAMIN D (ERGOCALCIFEROL) 1.25 MG (50000 UNIT) PO CAPS: 50000.0000 [IU] | ORAL_CAPSULE | ORAL | 0 refills | Status: DC

## 2018-01-14 DIAGNOSIS — F3341 Major depressive disorder, recurrent, in partial remission: Secondary | ICD-10-CM | POA: Diagnosis not present

## 2018-01-14 DIAGNOSIS — F332 Major depressive disorder, recurrent severe without psychotic features: Secondary | ICD-10-CM | POA: Diagnosis not present

## 2018-01-15 NOTE — Progress Notes (Signed)
Office: [phone]  /  Fax: [phone]   HPI:   Chief Complaint: OBESITY Shannon [name] is here to discuss her progress with her obesity treatment plan. She is on the Category 3 plan and is following her eating plan approximately 75 % of the time. She states she is exercising 0 minutes 0 times per week. Shannon [name] did very well with weight loss on her Category 3 plan. Her hunger was controlled and she struggled to eat all of her dinner due to volume. She was happy overall with her food choices.  Her weight is 258 lb (117 kg) today and has had a weight loss of 5 pounds over a period of 2 weeks since her last visit. She has lost 5 lbs since starting treatment with [place].  Hyperlipidemia Shannon [name] has hyperlipidemia and has been trying to improve her cholesterol levels with intensive lifestyle modification including a low saturated fat diet, exercise and weight loss. Her LDL and triglycerides were elevated. She denies any chest pain. She would like to try diet control to lower her cholesterol levels.  Vitamin D deficiency Shannon [name] has a diagnosis of vitamin D deficiency. She has a very low level of vitamin D and is not taking vitamin D. She admits fatigue.  Pre-Diabetes Shannon [name] has a diagnosis of pre-diabetes based on her elevated Hgb A1c and was informed this puts her at greater risk of developing diabetes. She is not taking metformin currently and continues to work on improving with her diet, but she was struggling with polyphagia before starting her Category 3 diet plan.  At risk for diabetes Shannon [name] is at higher than average risk for developing diabetes due to her pre-diabetes and obesity.   ALLERGIES: Allergies  Allergen Reactions  . Bupropion Other (See Comments)    Reaction:  Constipation, states just the extended release    MEDICATIONS: Current Outpatient Medications on File Prior to Visit  Medication Sig Dispense Refill  . buPROPion (WELLBUTRIN) 100 MG tablet Take 100 mg by mouth 2 (two)  times daily.    [name] losartan (COZAAR) 25 MG tablet Take 1 tablet (25 mg total) by mouth daily. 30 tablet 5  . norethindrone (ORTHO MICRONOR) 0.35 MG tablet Take 1 tablet (0.35 mg total) by mouth daily. 3 Package 4   No current facility-administered medications on file prior to visit.     PAST MEDICAL HISTORY: Past Medical History:  Diagnosis Date  . Anemia   . Anxiety   . Constipation   . Depression   . Dyspnea   . HTN (hypertension)   . Knee pain   . Leg edema   . Medical history non-contributory   . Obesity   . Pain in both feet   . Prolonged QT interval   . Pulmonary thromboembolism ([place])   . Suicide ideation     PAST SURGICAL HISTORY: Past Surgical History:  Procedure Laterality Date  . NO PAST SURGERIES    . TEETH REMOVAL  07/2017    SOCIAL HISTORY: Social History   Tobacco Use  . Smoking status: Never Smoker  . Smokeless tobacco: Never Used  Substance Use Topics  . Alcohol use: No  . Drug use: No    FAMILY HISTORY: Family History  Problem Relation Age of Onset  . Healthy Mother   . Hypertension Mother   . Obesity Mother   . Healthy Father   . Pulmonary embolism Neg Hx   . Deep vein thrombosis Neg Hx     ROS: Review of Systems  Constitutional:  Positive for malaise/fatigue and weight loss.  Cardiovascular: Negative for chest pain.  Endo/Heme/Allergies:       Positive for polyphagia.    PHYSICAL EXAM: Blood pressure 119/78, pulse 77, temperature 98.1 F (36.7 C), temperature source Oral, height 5\' 9"  (1.753 m), weight 258 lb (117 kg), last menstrual period 12/27/2017, SpO2 100 %. Body mass index is 38.1 kg/m. Physical Exam  Constitutional: She is oriented to person, place, and time. She appears well-developed and well-nourished.  Cardiovascular: Normal rate.  Pulmonary/Chest: Effort normal.  Musculoskeletal: Normal range of motion.  Neurological: She is oriented to person, place, and time.  Skin: Skin is warm and dry.  Psychiatric: She has a  normal mood and affect. Her behavior is normal.  Vitals reviewed.   RECENT LABS AND TESTS: BMET    Component Value Date/Time   NA 139 12/29/2017 1051   K 4.0 12/29/2017 1051   CL 102 12/29/2017 1051   CO2 23 12/29/2017 1051   GLUCOSE 84 12/29/2017 1051   GLUCOSE 98 10/15/2017 1635   BUN 7 12/29/2017 1051   CREATININE 0.96 12/29/2017 1051   CALCIUM 9.3 12/29/2017 1051   GFRNONAA 81 12/29/2017 1051   GFRAA 94 12/29/2017 1051   Lab Results  Component Value Date   HGBA1C 5.5 12/29/2017   HGBA1C 5.8 08/07/2017   Lab Results  Component Value Date   INSULIN 21.8 12/29/2017   CBC    Component Value Date/Time   WBC 5.1 12/29/2017 1051   WBC 5.7 08/07/2017 0938   RBC 4.72 12/29/2017 1051   RBC 4.90 08/07/2017 0938   HGB 12.2 12/29/2017 1051   HCT 37.9 12/29/2017 1051   PLT 354.0 08/07/2017 0938   MCV 80 12/29/2017 1051   MCH 25.8 (L) 12/29/2017 1051   MCH 24.5 (L) 07/06/2016 0536   MCHC 32.2 12/29/2017 1051   MCHC 32.7 08/07/2017 0938   RDW 13.1 12/29/2017 1051   LYMPHSABS 1.9 12/29/2017 1051   EOSABS 0.2 12/29/2017 1051   BASOSABS 0.1 12/29/2017 1051   Iron/TIBC/Ferritin/ %Sat    Component Value Date/Time   IRON 30 12/29/2017 1051   TIBC 334 12/29/2017 1051   FERRITIN 12 (L) 12/29/2017 1051   IRONPCTSAT 9 (LL) 12/29/2017 1051   Lipid Panel     Component Value Date/Time   CHOL 235 (H) 12/29/2017 1051   TRIG 58 12/29/2017 1051   HDL 45 12/29/2017 1051   CHOLHDL 6 08/07/2017 0938   VLDL 22.0 08/07/2017 0938   LDLCALC 178 (H) 12/29/2017 1051   Hepatic Function Panel     Component Value Date/Time   PROT 7.4 12/29/2017 1051   ALBUMIN 4.5 12/29/2017 1051   AST 14 12/29/2017 1051   ALT 10 12/29/2017 1051   ALKPHOS 128 (H) 12/29/2017 1051   BILITOT 0.6 12/29/2017 1051      Component Value Date/Time   TSH 0.792 12/29/2017 1051   TSH 1.16 08/07/2017 0938   TSH 1.69 08/06/2016 0855   Results for Shannon [name] (MRN [id number]) as of 01/15/2018 10:52  Ref.  Range 12/29/2017 10:51  Vitamin D, 25-Hydroxy Latest Ref Range: 30.0 - 100.0 ng/mL 12.7 (L)   ASSESSMENT AND PLAN: Other hyperlipidemia  Vitamin D deficiency - Plan: Vitamin D, Ergocalciferol, (DRISDOL) 50000 units CAPS capsule  Prediabetes - Plan: metFORMIN (GLUCOPHAGE) 500 MG tablet  At risk for diabetes mellitus  Class 2 severe obesity with serious comorbidity and body mass index (BMI) of 38.0 to 38.9 in adult, unspecified obesity type ([place])  PLAN:  Hyperlipidemia  Shannon [name] was informed of the American Heart Association Guidelines emphasizing intensive lifestyle modifications as the first line treatment for hyperlipidemia. We discussed many lifestyle modifications today in depth and Janaiah will continue to work on decreasing saturated fats such as fatty red meat, butter and many fried foods. She will also increase vegetables and lean protein in her diet and continue to work on exercise and weight loss efforts. We will recheck labs in 3 months. She agrees to continue with her diet, exercise, and weight loss and follow up in 2 weeks.  Vitamin D Deficiency Shannon [name] was informed that low vitamin D levels contributes to fatigue and are associated with obesity, breast, and colon cancer. She agrees to start to take prescription Vit D @50 ,000 IU every week #4 with no refills and will follow up for routine testing of vitamin D, at least 2-3 times per year. She was informed of the risk of over-replacement of vitamin D and agrees to not increase her dose unless she discusses this with [place] first. We will recheck labs in 3 months. Shannon [name] agrees to follow up in 2 weeks.  Pre-Diabetes Shannon [name] will continue to work on weight loss, exercise, and decreasing simple carbohydrates in her diet to help decrease the risk of diabetes. We discussed metformin including benefits and risks. She was informed that eating too many simple carbohydrates or too many calories at one sitting increases the likelihood of GI side  effects. Shannon [name] agreed to continue with her diet and to start metformin 500mg  qAM #30 with no refills and a prescription was written today. We will recheck labs in 3 months. Shannon [name] agreed to follow up with [place] as directed to monitor her progress.  Diabetes risk counseling Shannon [name] was given extended (30 minutes) diabetes prevention counseling today. She is 26 y.o. female and has risk factors for diabetes including pre-diabetes and obesity. We discussed intensive lifestyle modifications today with an emphasis on weight loss as well as increasing exercise and decreasing simple carbohydrates in her diet.  Obesity Shannon [name] is currently in the action stage of change. As such, her goal is to continue with weight loss efforts. She has agreed to follow the Category 3 plan. Shannon [name] has been instructed to work up to a goal of 150 minutes of combined cardio and strengthening exercise per week for weight loss and overall health benefits. We discussed the following Behavioral Modification Strategies today: increasing lean protein intake, decreasing simple carbohydrates, no skipping meals, and keeping healthier foods in the home.   Shannon [name] has agreed to follow up with our clinic in 2 weeks. She was informed of the importance of frequent follow up visits to maximize her success with intensive lifestyle modifications for her multiple health conditions.   OBESITY BEHAVIORAL INTERVENTION VISIT  Today's visit was # 2   Starting weight: 263 lbs Starting date: 12/29/17 Today's weight : Weight: 258 lb (117 kg)  Today's date: 01/12/2018 Total lbs lost to date: 5  ASK: We discussed the diagnosis of obesity with Shannon [name] today and Shannon [name] agreed to give [place] permission to discuss obesity behavioral modification therapy today.  ASSESS: Shannon [name] has the diagnosis of obesity and her BMI today is 38.08. Shannon [name] is in the action stage of change.   ADVISE: Shannon [name] was educated on the multiple health risks of obesity as well  as the benefit of weight loss to improve her health. She was advised of the need for long term treatment and the importance of lifestyle modifications to improve her current health and to  decrease her risk of future health problems.  AGREE: Multiple dietary modification options and treatment options were discussed and Shannon [name] agreed to follow the recommendations documented in the above note.  ARRANGE: Shannon [name] was educated on the importance of frequent visits to treat obesity as outlined per CMS and USPSTF guidelines and agreed to schedule her next follow up appointment today.  I, Marcille Blanco, am acting as transcriptionist for Starlyn Skeans, MD  I have reviewed the above documentation for accuracy and completeness, and I agree with the above. -Dennard Nip, MD

## 2018-01-19 ENCOUNTER — Encounter (INDEPENDENT_AMBULATORY_CARE_PROVIDER_SITE_OTHER): Payer: Self-pay | Admitting: Family Medicine

## 2018-01-21 DIAGNOSIS — F3341 Major depressive disorder, recurrent, in partial remission: Secondary | ICD-10-CM | POA: Diagnosis not present

## 2018-01-28 ENCOUNTER — Ambulatory Visit (INDEPENDENT_AMBULATORY_CARE_PROVIDER_SITE_OTHER): Payer: BLUE CROSS/BLUE SHIELD | Admitting: Family Medicine

## 2018-01-28 VITALS — BP 120/77 | HR 93 | Temp 97.5°F | Ht 69.0 in | Wt 255.0 lb

## 2018-01-28 DIAGNOSIS — R7303 Prediabetes: Secondary | ICD-10-CM | POA: Diagnosis not present

## 2018-01-28 DIAGNOSIS — Z9189 Other specified personal risk factors, not elsewhere classified: Secondary | ICD-10-CM | POA: Diagnosis not present

## 2018-01-28 DIAGNOSIS — E559 Vitamin D deficiency, unspecified: Secondary | ICD-10-CM | POA: Diagnosis not present

## 2018-01-28 DIAGNOSIS — Z6837 Body mass index (BMI) 37.0-37.9, adult: Secondary | ICD-10-CM

## 2018-01-28 DIAGNOSIS — F3341 Major depressive disorder, recurrent, in partial remission: Secondary | ICD-10-CM | POA: Diagnosis not present

## 2018-01-28 MED ORDER — METFORMIN HCL 500 MG PO TABS: 500.0000 mg | ORAL_TABLET | Freq: Every day | ORAL | 0 refills | Status: DC

## 2018-01-28 MED ORDER — VITAMIN D (ERGOCALCIFEROL) 1.25 MG (50000 UNIT) PO CAPS: 50000.0000 [IU] | ORAL_CAPSULE | ORAL | 0 refills | Status: DC

## 2018-01-29 NOTE — Progress Notes (Signed)
Office: [phone]  /  Fax: [phone]   HPI:   Chief Complaint: OBESITY Shannon [name] is here to discuss her progress with her obesity treatment plan. She is on the Category 3 plan and is following her eating plan approximately 50 % of the time. She states she is exercising 0 minutes 0 times per week. Martin continues to do well with weight loss on her Category 3 plan. She is getting bored with dinner and would like to look at other options.  Her weight is 255 lb (115.7 kg) today and has had a weight loss of 3 pounds over a period of 2 weeks since her last visit. She has lost 8 lbs since starting treatment with [place].  Pre-Diabetes Shannon [name] has a diagnosis of pre-diabetes based on her elevated Hgb A1c and was informed this puts her at greater risk of developing diabetes. She is stable on metformin and denies nausea, vomiting, or hypoglycemia. She is doing well on diet and continues to work on exercise to decrease risk of diabetes.  At risk for diabetes Shannon [name] is at higher than average risk for developing diabetes due to her obesity and pre-diabetes. She currently denies polyuria or polydipsia.  Vitamin D Deficiency Shannon [name] has a diagnosis of vitamin D deficiency. She is stable on prescription Vit D. She feels her fatigue is improving already and denies nausea, vomiting or muscle weakness.  ALLERGIES: Allergies  Allergen Reactions  . Bupropion Other (See Comments)    Reaction:  Constipation, states just the extended release    MEDICATIONS: Current Outpatient Medications on File Prior to Visit  Medication Sig Dispense Refill  . buPROPion (WELLBUTRIN) 100 MG tablet Take 100 mg by mouth 2 (two) times daily.    [name] losartan (COZAAR) 25 MG tablet Take 1 tablet (25 mg total) by mouth daily. 30 tablet 5  . norethindrone (ORTHO MICRONOR) 0.35 MG tablet Take 1 tablet (0.35 mg total) by mouth daily. 3 Package 4   No current facility-administered medications on file prior to visit.     PAST MEDICAL  HISTORY: Past Medical History:  Diagnosis Date  . Anemia   . Anxiety   . Constipation   . Depression   . Dyspnea   . HTN (hypertension)   . Knee pain   . Leg edema   . Medical history non-contributory   . Obesity   . Pain in both feet   . Prolonged QT interval   . Pulmonary thromboembolism ([place])   . Suicide ideation     PAST SURGICAL HISTORY: Past Surgical History:  Procedure Laterality Date  . NO PAST SURGERIES    . TEETH REMOVAL  07/2017    SOCIAL HISTORY: Social History   Tobacco Use  . Smoking status: Never Smoker  . Smokeless tobacco: Never Used  Substance Use Topics  . Alcohol use: No  . Drug use: No    FAMILY HISTORY: Family History  Problem Relation Age of Onset  . Healthy Mother   . Hypertension Mother   . Obesity Mother   . Healthy Father   . Pulmonary embolism Neg Hx   . Deep vein thrombosis Neg Hx     ROS: Review of Systems  Constitutional: Positive for malaise/fatigue and weight loss.  Gastrointestinal: Negative for nausea and vomiting.  Genitourinary: Negative for frequency.  Musculoskeletal:       Negative muscle weakness  Endo/Heme/Allergies: Negative for polydipsia.       Negative hypoglycemia    PHYSICAL EXAM: Blood pressure 120/77, pulse 93,  temperature (!) 97.5 F (36.4 C), temperature source Oral, height 5\' 9"  (1.753 m), weight 255 lb (115.7 kg), SpO2 98 %. Body mass index is 37.66 kg/m. Physical Exam  Constitutional: She is oriented to person, place, and time. She appears well-developed and well-nourished.  Cardiovascular: Normal rate.  Pulmonary/Chest: Effort normal.  Musculoskeletal: Normal range of motion.  Neurological: She is oriented to person, place, and time.  Skin: Skin is warm and dry.  Psychiatric: She has a normal mood and affect. Her behavior is normal.  Vitals reviewed.   RECENT LABS AND TESTS: BMET    Component Value Date/Time   NA 139 12/29/2017 1051   K 4.0 12/29/2017 1051   CL 102 12/29/2017 1051     CO2 23 12/29/2017 1051   GLUCOSE 84 12/29/2017 1051   GLUCOSE 98 10/15/2017 1635   BUN 7 12/29/2017 1051   CREATININE 0.96 12/29/2017 1051   CALCIUM 9.3 12/29/2017 1051   GFRNONAA 81 12/29/2017 1051   GFRAA 94 12/29/2017 1051   Lab Results  Component Value Date   HGBA1C 5.5 12/29/2017   HGBA1C 5.8 08/07/2017   Lab Results  Component Value Date   INSULIN 21.8 12/29/2017   CBC    Component Value Date/Time   WBC 5.1 12/29/2017 1051   WBC 5.7 08/07/2017 0938   RBC 4.72 12/29/2017 1051   RBC 4.90 08/07/2017 0938   HGB 12.2 12/29/2017 1051   HCT 37.9 12/29/2017 1051   PLT 354.0 08/07/2017 0938   MCV 80 12/29/2017 1051   MCH 25.8 (L) 12/29/2017 1051   MCH 24.5 (L) 07/06/2016 0536   MCHC 32.2 12/29/2017 1051   MCHC 32.7 08/07/2017 0938   RDW 13.1 12/29/2017 1051   LYMPHSABS 1.9 12/29/2017 1051   EOSABS 0.2 12/29/2017 1051   BASOSABS 0.1 12/29/2017 1051   Iron/TIBC/Ferritin/ %Sat    Component Value Date/Time   IRON 30 12/29/2017 1051   TIBC 334 12/29/2017 1051   FERRITIN 12 (L) 12/29/2017 1051   IRONPCTSAT 9 (LL) 12/29/2017 1051   Lipid Panel     Component Value Date/Time   CHOL 235 (H) 12/29/2017 1051   TRIG 58 12/29/2017 1051   HDL 45 12/29/2017 1051   CHOLHDL 6 08/07/2017 0938   VLDL 22.0 08/07/2017 0938   LDLCALC 178 (H) 12/29/2017 1051   Hepatic Function Panel     Component Value Date/Time   PROT 7.4 12/29/2017 1051   ALBUMIN 4.5 12/29/2017 1051   AST 14 12/29/2017 1051   ALT 10 12/29/2017 1051   ALKPHOS 128 (H) 12/29/2017 1051   BILITOT 0.6 12/29/2017 1051      Component Value Date/Time   TSH 0.792 12/29/2017 1051   TSH 1.16 08/07/2017 0938   TSH 1.69 08/06/2016 0855  Results for Shannon [name], Shannon [name] (MRN [id number]) as of 01/29/2018 11:16  Ref. Range 12/29/2017 10:51  Vitamin D, 25-Hydroxy Latest Ref Range: 30.0 - 100.0 ng/mL 12.7 (L)    ASSESSMENT AND PLAN: Prediabetes - Plan: metFORMIN (GLUCOPHAGE) 500 MG tablet  Vitamin D deficiency - Plan:  Vitamin D, Ergocalciferol, (DRISDOL) 1.25 MG (50000 UT) CAPS capsule  At risk for diabetes mellitus  Class 2 severe obesity with serious comorbidity and body mass index (BMI) of 37.0 to 37.9 in adult, unspecified obesity type [facility])  PLAN:  Pre-Diabetes Shannon [name] will continue to work on weight loss, exercise, and decreasing simple carbohydrates in her diet to help decrease the risk of diabetes. We dicussed metformin including benefits and risks. She was informed that eating too  many simple carbohydrates or too many calories at one sitting increases the likelihood of GI side effects. Shannon [name] agrees to continue taking metformin 500 mg q AM #30 and we will refill for 1 month. Shannon [name] agrees to follow up with our clinic in 2 to 3 weeks as directed to monitor her progress.  Diabetes risk counselling Shannon [name] was given extended (15 minutes) diabetes prevention counseling today. She is 27 y.o. female and has risk factors for diabetes including obesity and pre-diabetes. We discussed intensive lifestyle modifications today with an emphasis on weight loss as well as increasing exercise and decreasing simple carbohydrates in her diet.  Vitamin D Deficiency Shannon [name] was informed that low vitamin D levels contributes to fatigue and are associated with obesity, breast, and colon cancer. Shannon [name] agrees to continue taking prescription Vit D @50 ,000 IU every week #4 and we will refill for 1 month. She will follow up for routine testing of vitamin D, at least 2-3 times per year. She was informed of the risk of over-replacement of vitamin D and agrees to not increase her dose unless she discusses this with [place] first. Shannon [name] agrees to follow up with our clinic in 2 to 3 weeks.  Obesity Shannon [name] is currently in the action stage of change. As such, her goal is to continue with weight loss efforts She has agreed to keep a food journal with 400-550 calories and 40 grams of protein at supper daily and follow the Category 3  plan Shannon [name] has been instructed to work up to a goal of 150 minutes of combined cardio and strengthening exercise per week for weight loss and overall health benefits. We discussed the following Behavioral Modification Strategies today: increasing lean protein intake, decreasing simple carbohydrates, work on meal planning and easy cooking plans, and celebration eating strategies    Shannon [name] has agreed to follow up with our clinic in 2 to 3 weeks. She was informed of the importance of frequent follow up visits to maximize her success with intensive lifestyle modifications for her multiple health conditions.   OBESITY BEHAVIORAL INTERVENTION VISIT  Today's visit was # 3   Starting weight: 263 lbs Starting date: 12/29/17 Today's weight : 255 lbs Today's date: 01/28/2018 Total lbs lost to date: 8    ASK: We discussed the diagnosis of obesity with Shannon [name] today and Shannon [name] agreed to give [place] permission to discuss obesity behavioral modification therapy today.  ASSESS: Shannon [name] has the diagnosis of obesity and her BMI today is 37.64 Shannon [name] is in the action stage of change   ADVISE: Shannon [name] was educated on the multiple health risks of obesity as well as the benefit of weight loss to improve her health. She was advised of the need for long term treatment and the importance of lifestyle modifications to improve her current health and to decrease her risk of future health problems.  AGREE: Multiple dietary modification options and treatment options were discussed and  Shannon [name] agreed to follow the recommendations documented in the above note.  ARRANGE: Shannon [name] was educated on the importance of frequent visits to treat obesity as outlined per CMS and USPSTF guidelines and agreed to schedule her next follow up appointment today.  I, Trixie Dredge, am acting as transcriptionist for Dennard Nip, MD  I have reviewed the above documentation for accuracy and completeness, and I agree with the above.  -Dennard Nip, MD

## 2018-02-04 ENCOUNTER — Encounter (INDEPENDENT_AMBULATORY_CARE_PROVIDER_SITE_OTHER): Payer: Self-pay

## 2018-02-04 DIAGNOSIS — F3341 Major depressive disorder, recurrent, in partial remission: Secondary | ICD-10-CM | POA: Diagnosis not present

## 2018-02-11 ENCOUNTER — Ambulatory Visit (INDEPENDENT_AMBULATORY_CARE_PROVIDER_SITE_OTHER): Payer: BLUE CROSS/BLUE SHIELD | Admitting: Family Medicine

## 2018-02-11 VITALS — BP 126/84 | HR 90 | Ht 69.0 in | Wt 254.0 lb

## 2018-02-11 DIAGNOSIS — F3289 Other specified depressive episodes: Secondary | ICD-10-CM

## 2018-02-11 DIAGNOSIS — Z6837 Body mass index (BMI) 37.0-37.9, adult: Secondary | ICD-10-CM

## 2018-02-11 DIAGNOSIS — R7303 Prediabetes: Secondary | ICD-10-CM | POA: Diagnosis not present

## 2018-02-11 DIAGNOSIS — F3341 Major depressive disorder, recurrent, in partial remission: Secondary | ICD-10-CM | POA: Diagnosis not present

## 2018-02-17 ENCOUNTER — Encounter (INDEPENDENT_AMBULATORY_CARE_PROVIDER_SITE_OTHER): Payer: Self-pay | Admitting: Family Medicine

## 2018-02-17 NOTE — Progress Notes (Signed)
Office: [phone]  /  Fax: [phone]   HPI:   Chief Complaint: OBESITY Shannon [name] is here to discuss her progress with her obesity treatment plan. She is follow the modified Category 3 plan and is following her eating plan approximately 40 % of the time. She states she is exercising 0 minutes 0 times per week. Shannon [name] does not like eating dinner. She used to snack for dinner rather than eating a meal. She is doing well with journaling for dinner.  Her weight is 254 lb (115.2 kg) today and has had a weight loss of 1 pound over a period of 2 weeks since her last visit. She has lost 9 lbs since starting treatment with [place].  Depression with emotional eating behaviors Shannon [name] is struggling with emotional eating and using food for comfort to the extent that it is negatively impacting her health. She often snacks when she is not hungry. Shannon [name] sometimes feels she is out of control and then feels guilty that she made poor food choices. She has been working on behavior modification techniques to help reduce her emotional eating and has been somewhat successful. She has been seeing Dr. Mallie Mussel, but does not want to follow up with her. She is still struggling quite a bit with emotional eating.  Pre-Diabetes Shannon [name] has a diagnosis of pre-diabetes based on her elevated Hgb A1c was 5.5 on 12/29/17 and was informed this puts her at greater risk of developing diabetes. She is taking metformin and denies nausea, vomiting, or diarrhea. She continues to work on diet and exercise to decrease risk of diabetes. She denies polyphagia  or hypoglycemia.  ALLERGIES: Allergies  Allergen Reactions  . Bupropion Other (See Comments)    Reaction:  Constipation, states just the extended release    MEDICATIONS: Current Outpatient Medications on File Prior to Visit  Medication Sig Dispense Refill  . buPROPion (WELLBUTRIN) 100 MG tablet Take 100 mg by mouth 2 (two) times daily.    Shannon [name] Kitchen losartan (COZAAR) 25 MG tablet Take 1  tablet (25 mg total) by mouth daily. 30 tablet 5  . metFORMIN (GLUCOPHAGE) 500 MG tablet Take 1 tablet (500 mg total) by mouth daily with breakfast. 30 tablet 0  . norethindrone (ORTHO MICRONOR) 0.35 MG tablet Take 1 tablet (0.35 mg total) by mouth daily. 3 Package 4  . Vitamin D, Ergocalciferol, (DRISDOL) 1.25 MG (50000 UT) CAPS capsule Take 1 capsule (50,000 Units total) by mouth every 7 (seven) days. 4 capsule 0   No current facility-administered medications on file prior to visit.     PAST MEDICAL HISTORY: Past Medical History:  Diagnosis Date  . Anemia   . Anxiety   . Constipation   . Depression   . Dyspnea   . HTN (hypertension)   . Knee pain   . Leg edema   . Medical history non-contributory   . Obesity   . Pain in both feet   . Prolonged QT interval   . Pulmonary thromboembolism ([place])   . Suicide ideation     PAST SURGICAL HISTORY: Past Surgical History:  Procedure Laterality Date  . NO PAST SURGERIES    . TEETH REMOVAL  07/2017    SOCIAL HISTORY: Social History   Tobacco Use  . Smoking status: Never Smoker  . Smokeless tobacco: Never Used  Substance Use Topics  . Alcohol use: No  . Drug use: No    FAMILY HISTORY: Family History  Problem Relation Age of Onset  . Healthy Mother   .  Hypertension Mother   . Obesity Mother   . Healthy Father   . Pulmonary embolism Neg Hx   . Deep vein thrombosis Neg Hx     ROS: Review of Systems  Constitutional: Positive for weight loss.  Gastrointestinal: Negative for diarrhea, nausea and vomiting.  Endo/Heme/Allergies:       Negative for polyphagia. Negative for hypoglycemia.  Psychiatric/Behavioral: Positive for depression.    PHYSICAL EXAM: Blood pressure 126/84, pulse 90, height 5\' 9"  (1.753 m), weight 254 lb (115.2 kg), SpO2 99 %. Body mass index is 37.51 kg/m. Physical Exam  Constitutional: She is oriented to person, place, and time. She appears well-developed and well-nourished.  Cardiovascular:  Normal rate.  Pulmonary/Chest: Effort normal.  Musculoskeletal: Normal range of motion.  Neurological: She is oriented to person, place, and time.  Skin: Skin is warm and dry.  Psychiatric: She has a normal mood and affect. Her behavior is normal.  Vitals reviewed.   RECENT LABS AND TESTS: BMET    Component Value Date/Time   NA 139 12/29/2017 1051   K 4.0 12/29/2017 1051   CL 102 12/29/2017 1051   CO2 23 12/29/2017 1051   GLUCOSE 84 12/29/2017 1051   GLUCOSE 98 10/15/2017 1635   BUN 7 12/29/2017 1051   CREATININE 0.96 12/29/2017 1051   CALCIUM 9.3 12/29/2017 1051   GFRNONAA 81 12/29/2017 1051   GFRAA 94 12/29/2017 1051   Lab Results  Component Value Date   HGBA1C 5.5 12/29/2017   HGBA1C 5.8 08/07/2017   Lab Results  Component Value Date   INSULIN 21.8 12/29/2017   CBC    Component Value Date/Time   WBC 5.1 12/29/2017 1051   WBC 5.7 08/07/2017 0938   RBC 4.72 12/29/2017 1051   RBC 4.90 08/07/2017 0938   HGB 12.2 12/29/2017 1051   HCT 37.9 12/29/2017 1051   PLT 354.0 08/07/2017 0938   MCV 80 12/29/2017 1051   MCH 25.8 (L) 12/29/2017 1051   MCH 24.5 (L) 07/06/2016 0536   MCHC 32.2 12/29/2017 1051   MCHC 32.7 08/07/2017 0938   RDW 13.1 12/29/2017 1051   LYMPHSABS 1.9 12/29/2017 1051   EOSABS 0.2 12/29/2017 1051   BASOSABS 0.1 12/29/2017 1051   Iron/TIBC/Ferritin/ %Sat    Component Value Date/Time   IRON 30 12/29/2017 1051   TIBC 334 12/29/2017 1051   FERRITIN 12 (L) 12/29/2017 1051   IRONPCTSAT 9 (LL) 12/29/2017 1051   Lipid Panel     Component Value Date/Time   CHOL 235 (H) 12/29/2017 1051   TRIG 58 12/29/2017 1051   HDL 45 12/29/2017 1051   CHOLHDL 6 08/07/2017 0938   VLDL 22.0 08/07/2017 0938   LDLCALC 178 (H) 12/29/2017 1051   Hepatic Function Panel     Component Value Date/Time   PROT 7.4 12/29/2017 1051   ALBUMIN 4.5 12/29/2017 1051   AST 14 12/29/2017 1051   ALT 10 12/29/2017 1051   ALKPHOS 128 (H) 12/29/2017 1051   BILITOT 0.6  12/29/2017 1051      Component Value Date/Time   TSH 0.792 12/29/2017 1051   TSH 1.16 08/07/2017 0938   TSH 1.69 08/06/2016 0855   Results for BRITTENEY, AYOTTE (MRN [id number]) as of 02/17/2018 11:21  Ref. Range 12/29/2017 10:51  Vitamin D, 25-Hydroxy Latest Ref Range: 30.0 - 100.0 ng/mL 12.7 (L)   ASSESSMENT AND PLAN: Prediabetes  Other depression - with emotional eating   Class 2 severe obesity with serious comorbidity and body mass index (BMI) of 37.0 to  37.9 in adult, unspecified obesity type Shannon [name])  PLAN:  Pre-Diabetes Shannon [name] will continue to work on weight loss, exercise, and decreasing simple carbohydrates in her diet to help decrease the risk of diabetes.  She was informed that eating too many simple carbohydrates or too many calories at one sitting increases the likelihood of GI side effects. Shannon [name] agreed to continue with her diet and to take metformin  and a prescription was not written today. Shannon [name] agreed to follow up with [place] as directed to monitor her progress in 2 weeks.  Depression with Emotional Eating Behaviors We discussed behavior modification techniques today to help Shannon [name] deal with her emotional eating and depression. She has agreed to follow up with her therapist outside of [facility] and agreed to follow up with [place] as directed.  I spent > than 50% of the 15 minute visit on counseling as documented in the note.  Obesity  Shannon [name] is currently in the action stage of change. As such, her goal is to continue with weight loss efforts. She has agreed to follow the Category 3 plan and keeping a food journal of 400 to 550 calories and 40 grams of protein for supper. Shannon [name] has been instructed to work up to a goal of 150 minutes of combined cardio and strengthening exercise per week for weight loss and overall health benefits. We discussed the following Behavioral Modification Strategies today: increasing lean protein intake, increasing vegetables, and  holiday eating strategies.   Shannon [name] has agreed to follow up with our clinic in 2 weeks. She was informed of the importance of frequent follow up visits to maximize her success with intensive lifestyle modifications for her multiple health conditions.   OBESITY BEHAVIORAL INTERVENTION VISIT  Today's visit was # 4   Starting weight: 263 lbs Starting date: 12/29/17 Today's weight : Weight: 254 lb (115.2 kg)  Today's date: 02/11/2018 Total lbs lost to date: 9  ASK: We discussed the diagnosis of obesity with Shannon [name] today and Shannon [name] agreed to give [place] permission to discuss obesity behavioral modification therapy today.  ASSESS: Shannon [name] has the diagnosis of obesity and her BMI today is 37.49. Shannon [name] is in the action stage of change.   ADVISE: Shannon [name] was educated on the multiple health risks of obesity as well as the benefit of weight loss to improve her health. She was advised of the need for long term treatment and the importance of lifestyle modifications to improve her current health and to decrease her risk of future health problems.  AGREE: Multiple dietary modification options and treatment options were discussed and Jesseca agreed to follow the recommendations documented in the above note.  ARRANGE: Shannon [name] was educated on the importance of frequent visits to treat obesity as outlined per CMS and USPSTF guidelines and agreed to schedule her next follow up appointment today.  Lenward Chancellor, am acting as Location manager for Georgianne Fick, FNP.  I have reviewed the above documentation for accuracy and completeness, and I agree with the above.  - Khriz Liddy, FNP-C.

## 2018-02-18 DIAGNOSIS — F411 Generalized anxiety disorder: Secondary | ICD-10-CM | POA: Diagnosis not present

## 2018-02-25 ENCOUNTER — Ambulatory Visit (INDEPENDENT_AMBULATORY_CARE_PROVIDER_SITE_OTHER): Payer: BLUE CROSS/BLUE SHIELD | Admitting: Family Medicine

## 2018-02-25 VITALS — BP 131/86 | HR 103 | Temp 97.8°F | Ht 69.0 in | Wt 252.0 lb

## 2018-02-25 DIAGNOSIS — R7303 Prediabetes: Secondary | ICD-10-CM | POA: Diagnosis not present

## 2018-02-25 DIAGNOSIS — E559 Vitamin D deficiency, unspecified: Secondary | ICD-10-CM

## 2018-02-25 DIAGNOSIS — Z9189 Other specified personal risk factors, not elsewhere classified: Secondary | ICD-10-CM | POA: Diagnosis not present

## 2018-02-25 DIAGNOSIS — F3341 Major depressive disorder, recurrent, in partial remission: Secondary | ICD-10-CM | POA: Diagnosis not present

## 2018-02-25 DIAGNOSIS — Z6837 Body mass index (BMI) 37.0-37.9, adult: Secondary | ICD-10-CM

## 2018-02-25 MED ORDER — VITAMIN D (ERGOCALCIFEROL) 1.25 MG (50000 UNIT) PO CAPS: 50000.0000 [IU] | ORAL_CAPSULE | ORAL | 0 refills | Status: DC

## 2018-02-25 MED ORDER — METFORMIN HCL 500 MG PO TABS: 500.0000 mg | ORAL_TABLET | Freq: Every day | ORAL | 0 refills | Status: DC

## 2018-03-02 ENCOUNTER — Encounter (INDEPENDENT_AMBULATORY_CARE_PROVIDER_SITE_OTHER): Payer: Self-pay | Admitting: Family Medicine

## 2018-03-02 NOTE — Progress Notes (Signed)
Office: [phone]  /  Fax: [phone]   HPI:   Chief Complaint: OBESITY Shannon [name] is here to discuss her progress with her obesity treatment plan. She is on the  keep a food journal with 400-550 calories and 40g of protein at supper and follow the Category 3 plan and is following her eating plan approximately 25 % of the time. She states she is exercising 0 minutes 0 times per week. Shannon [name] has increased stress, so she is not eating as much. She is not eating all the food on the plan. She rarely has soda and she used to drink quite a bit of soda.  Her weight is 252 lb (114.3 kg) today and has had a weight loss of 2 pounds over a period of 2 weeks since her last visit. She has lost 11 lbs since starting treatment with [place].  Vitamin D deficiency Shannon [name] has a diagnosis of vitamin D deficiency. She is currently taking vit D, but not at goal and denies nausea, vomiting or muscle weakness.  Ref. Range 12/29/2017 10:51  Vitamin D, 25-Hydroxy Latest Ref Range: 30.0 - 100.0 ng/mL 12.7 (L)   Pre-Diabetes Shannon [name] has a diagnosis of prediabetes based on her elevated HgA1c and was informed this puts her at greater risk of developing diabetes. She is taking metformin currently and continues to work on diet and exercise to decrease risk of diabetes. She denies nausea and rarely has hypoglycemic episodes.   At risk for osteopenia and osteoporosis Shannon [name] is at higher risk of osteopenia and osteoporosis due to vitamin D deficiency.   ALLERGIES: Allergies  Allergen Reactions  . Bupropion Other (See Comments)    Reaction:  Constipation, states just the extended release    MEDICATIONS: Current Outpatient Medications on File Prior to Visit  Medication Sig Dispense Refill  . buPROPion (WELLBUTRIN) 100 MG tablet Take 100 mg by mouth 2 (two) times daily.    [name] losartan (COZAAR) 25 MG tablet Take 1 tablet (25 mg total) by mouth daily. 30 tablet 5  . norethindrone (ORTHO MICRONOR) 0.35 MG tablet Take 1 tablet  (0.35 mg total) by mouth daily. 3 Package 4   No current facility-administered medications on file prior to visit.     PAST MEDICAL HISTORY: Past Medical History:  Diagnosis Date  . Anemia   . Anxiety   . Constipation   . Depression   . Dyspnea   . HTN (hypertension)   . Knee pain   . Leg edema   . Medical history non-contributory   . Obesity   . Pain in both feet   . Prolonged QT interval   . Pulmonary thromboembolism ([place])   . Suicide ideation     PAST SURGICAL HISTORY: Past Surgical History:  Procedure Laterality Date  . NO PAST SURGERIES    . TEETH REMOVAL  07/2017    SOCIAL HISTORY: Social History   Tobacco Use  . Smoking status: Never Smoker  . Smokeless tobacco: Never Used  Substance Use Topics  . Alcohol use: No  . Drug use: No    FAMILY HISTORY: Family History  Problem Relation Age of Onset  . Healthy Mother   . Hypertension Mother   . Obesity Mother   . Healthy Father   . Pulmonary embolism Neg Hx   . Deep vein thrombosis Neg Hx     ROS: Review of Systems  Constitutional: Positive for weight loss.  Gastrointestinal: Negative for nausea and vomiting.  Musculoskeletal:  Negative for muscle weakness  Endo/Heme/Allergies:       Positive for hypoglycemia    PHYSICAL EXAM: Blood pressure 131/86, pulse (!) 103, temperature 97.8 F (36.6 C), temperature source Oral, height 5\' 9"  (1.753 m), weight 252 lb (114.3 kg), SpO2 99 %. Body mass index is 37.21 kg/m. Physical Exam  Constitutional: She is oriented to person, place, and time. She appears well-developed and well-nourished.  HENT:  Head: Normocephalic.  Eyes: Pupils are equal, round, and reactive to light.  Neck: Normal range of motion.  Cardiovascular: Normal rate.  Pulmonary/Chest: Effort normal.  Musculoskeletal: Normal range of motion.  Neurological: She is alert and oriented to person, place, and time.  Skin: Skin is warm and dry.  Psychiatric: She has a normal mood and  affect. Her behavior is normal.  Vitals reviewed.   RECENT LABS AND TESTS: BMET    Component Value Date/Time   NA 139 12/29/2017 1051   K 4.0 12/29/2017 1051   CL 102 12/29/2017 1051   CO2 23 12/29/2017 1051   GLUCOSE 84 12/29/2017 1051   GLUCOSE 98 10/15/2017 1635   BUN 7 12/29/2017 1051   CREATININE 0.96 12/29/2017 1051   CALCIUM 9.3 12/29/2017 1051   GFRNONAA 81 12/29/2017 1051   GFRAA 94 12/29/2017 1051   Lab Results  Component Value Date   HGBA1C 5.5 12/29/2017   HGBA1C 5.8 08/07/2017   Lab Results  Component Value Date   INSULIN 21.8 12/29/2017   CBC    Component Value Date/Time   WBC 5.1 12/29/2017 1051   WBC 5.7 08/07/2017 0938   RBC 4.72 12/29/2017 1051   RBC 4.90 08/07/2017 0938   HGB 12.2 12/29/2017 1051   HCT 37.9 12/29/2017 1051   PLT 354.0 08/07/2017 0938   MCV 80 12/29/2017 1051   MCH 25.8 (L) 12/29/2017 1051   MCH 24.5 (L) 07/06/2016 0536   MCHC 32.2 12/29/2017 1051   MCHC 32.7 08/07/2017 0938   RDW 13.1 12/29/2017 1051   LYMPHSABS 1.9 12/29/2017 1051   EOSABS 0.2 12/29/2017 1051   BASOSABS 0.1 12/29/2017 1051   Iron/TIBC/Ferritin/ %Sat    Component Value Date/Time   IRON 30 12/29/2017 1051   TIBC 334 12/29/2017 1051   FERRITIN 12 (L) 12/29/2017 1051   IRONPCTSAT 9 (LL) 12/29/2017 1051   Lipid Panel     Component Value Date/Time   CHOL 235 (H) 12/29/2017 1051   TRIG 58 12/29/2017 1051   HDL 45 12/29/2017 1051   CHOLHDL 6 08/07/2017 0938   VLDL 22.0 08/07/2017 0938   LDLCALC 178 (H) 12/29/2017 1051   Hepatic Function Panel     Component Value Date/Time   PROT 7.4 12/29/2017 1051   ALBUMIN 4.5 12/29/2017 1051   AST 14 12/29/2017 1051   ALT 10 12/29/2017 1051   ALKPHOS 128 (H) 12/29/2017 1051   BILITOT 0.6 12/29/2017 1051      Component Value Date/Time   TSH 0.792 12/29/2017 1051   TSH 1.16 08/07/2017 0938   TSH 1.69 08/06/2016 0855    Ref. Range 12/29/2017 10:51  Vitamin D, 25-Hydroxy Latest Ref Range: 30.0 - 100.0 ng/mL  12.7 (L)    ASSESSMENT AND PLAN: Prediabetes - Plan: metFORMIN (GLUCOPHAGE) 500 MG tablet  Vitamin D deficiency - Plan: Vitamin D, Ergocalciferol, (DRISDOL) 1.25 MG (50000 UT) CAPS capsule  At risk for osteoporosis  Class 2 severe obesity with serious comorbidity and body mass index (BMI) of 37.0 to 37.9 in adult, unspecified obesity type ([place])  PLAN: Vitamin  D Deficiency Katilyn was informed that low vitamin D levels contributes to fatigue and are associated with obesity, breast, and colon cancer. She agrees to continue to take prescription Vit D @50 ,000 IU every week #4 with no refills and will follow up for routine testing of vitamin D, at least 2-3 times per year. She was informed of the risk of over-replacement of vitamin D and agrees to not increase her dose unless she discusses this with [place] first. Agrees to follow up with our clinic as directed.   Pre-Diabetes Dereon will continue to work on weight loss, exercise, and decreasing simple carbohydrates in her diet to help decrease the risk of diabetes. We dicussed metformin including benefits and risks. She was informed that eating too many simple carbohydrates or too many calories at one sitting increases the likelihood of GI side effects. Lizmarie agreed to continue metformin 500 mg qAM #30 with no refills. Shinika agreed to follow up with [place] as directed to monitor her progress.  At risk for osteopenia and osteoporosis Luvada was given extended  (15 minutes) osteoporosis prevention counseling today. Gaynel is at risk for osteopenia and osteoporosis due to her vitamin D deficiency. She was encouraged to take her vitamin D and follow her higher calcium diet and increase strengthening exercise to help strengthen her bones and decrease her risk of osteopenia and osteoporosis.  Obesity Alizae is currently in the action stage of change. As such, her goal is to continue with weight loss efforts She has agreed to keep a food journal with 400-550  calories and 40g of protein for supper and follow the Category 3 plan.  Tinesha has been instructed to work up to a goal of 150 minutes of combined cardio and strengthening exercise per week for weight loss and overall health benefits. We discussed the following Behavioral Modification Strategies today: increasing lean protein intake, avoiding temptations, increasing water intake, better snacking choices, planning for success.    Mccall has agreed to follow up with our clinic in 2 weeks. She was informed of the importance of frequent follow up visits to maximize her success with intensive lifestyle modifications for her multiple health conditions.   OBESITY BEHAVIORAL INTERVENTION VISIT  Today's visit was # 5   Starting weight: 263 lb Starting date: 12/29/17 Today's weight : Weight: 252 lb (114.3 kg)  Today's date: 02/25/18 Total lbs lost to date: 11 lb    ASK: We discussed the diagnosis of obesity with Marcene Brawn today and Nisa agreed to give [place] permission to discuss obesity behavioral modification therapy today.  ASSESS: Odette has the diagnosis of obesity and her BMI today is 45.2 Anyah is in the action stage of change   ADVISE: Neeley was educated on the multiple health risks of obesity as well as the benefit of weight loss to improve her health. She was advised of the need for long term treatment and the importance of lifestyle modifications to improve her current health and to decrease her risk of future health problems.  AGREE: Multiple dietary modification options and treatment options were discussed and  Essynce agreed to follow the recommendations documented in the above note.  ARRANGE: Ayat was educated on the importance of frequent visits to treat obesity as outlined per CMS and USPSTF guidelines and agreed to schedule her next follow up appointment today.  I, Renee Ramus, am acting as Location manager for [employer], FNP-C.  I have reviewed the above  documentation for accuracy and completeness, and I agree with the above.  -  [employer], FNP-C.

## 2018-03-04 DIAGNOSIS — F3341 Major depressive disorder, recurrent, in partial remission: Secondary | ICD-10-CM | POA: Diagnosis not present

## 2018-03-11 DIAGNOSIS — F332 Major depressive disorder, recurrent severe without psychotic features: Secondary | ICD-10-CM | POA: Diagnosis not present

## 2018-03-11 DIAGNOSIS — F3341 Major depressive disorder, recurrent, in partial remission: Secondary | ICD-10-CM | POA: Diagnosis not present

## 2018-03-12 ENCOUNTER — Encounter (INDEPENDENT_AMBULATORY_CARE_PROVIDER_SITE_OTHER): Payer: Self-pay | Admitting: Family Medicine

## 2018-03-12 ENCOUNTER — Ambulatory Visit (INDEPENDENT_AMBULATORY_CARE_PROVIDER_SITE_OTHER): Payer: BLUE CROSS/BLUE SHIELD | Admitting: Family Medicine

## 2018-03-12 VITALS — BP 127/83 | HR 88 | Temp 97.9°F | Ht 69.0 in | Wt 253.0 lb

## 2018-03-12 DIAGNOSIS — Z6837 Body mass index (BMI) 37.0-37.9, adult: Secondary | ICD-10-CM | POA: Diagnosis not present

## 2018-03-12 DIAGNOSIS — E8881 Metabolic syndrome: Secondary | ICD-10-CM | POA: Diagnosis not present

## 2018-03-12 NOTE — Progress Notes (Signed)
Office: [phone]  /  Fax: [phone]   HPI:   Chief Complaint: OBESITY Elnore is here to discuss her progress with her obesity treatment plan. She is on the Category 3 plan and is journaling 400-500 calories with 40 grams of protein at supper daily. She is following her eating plan approximately 50% of the time. She states she is exercising 0 minutes 0 times per week. She would like to start doing some walking. Shaila is not getting all of her food in. She is sleeping too late to eat breakfast. She is not getting meal breaks at work.  She is buying unhealthy foods at the grocery store. Her weight is 253 lb (114.8 kg) today and has gained 1 pound since her last visit. She has lost 10 lbs since starting treatment with [place].  Insulin Resistance Harneet has a diagnosis of insulin resistance based on her elevated fasting insulin level >5. Although Cena's blood glucose readings are still under good control, insulin resistance puts her at greater risk of metabolic syndrome and diabetes. She is taking metformin currently and continues to work on diet and exercise to decrease risk of diabetes. She denies polyphagia.  ALLERGIES: Allergies  Allergen Reactions  . Bupropion Other (See Comments)    Reaction:  Constipation, states just the extended release    MEDICATIONS: Current Outpatient Medications on File Prior to Visit  Medication Sig Dispense Refill  . buPROPion (WELLBUTRIN) 100 MG tablet Take 100 mg by mouth 2 (two) times daily.    [name] losartan (COZAAR) 25 MG tablet Take 1 tablet (25 mg total) by mouth daily. 30 tablet 5  . metFORMIN (GLUCOPHAGE) 500 MG tablet Take 1 tablet (500 mg total) by mouth daily with breakfast. 30 tablet 0  . norethindrone (ORTHO MICRONOR) 0.35 MG tablet Take 1 tablet (0.35 mg total) by mouth daily. 3 Package 4  . Vitamin D, Ergocalciferol, (DRISDOL) 1.25 MG (50000 UT) CAPS capsule Take 1 capsule (50,000 Units total) by mouth every 7 (seven) days. 4 capsule 0    No current facility-administered medications on file prior to visit.     PAST MEDICAL HISTORY: Past Medical History:  Diagnosis Date  . Anemia   . Anxiety   . Constipation   . Depression   . Dyspnea   . HTN (hypertension)   . Knee pain   . Leg edema   . Medical history non-contributory   . Obesity   . Pain in both feet   . Prolonged QT interval   . Pulmonary thromboembolism ([place])   . Suicide ideation     PAST SURGICAL HISTORY: Past Surgical History:  Procedure Laterality Date  . NO PAST SURGERIES    . TEETH REMOVAL  07/2017    SOCIAL HISTORY: Social History   Tobacco Use  . Smoking status: Never Smoker  . Smokeless tobacco: Never Used  Substance Use Topics  . Alcohol use: No  . Drug use: No    FAMILY HISTORY: Family History  Problem Relation Age of Onset  . Healthy Mother   . Hypertension Mother   . Obesity Mother   . Healthy Father   . Pulmonary embolism Neg Hx   . Deep vein thrombosis Neg Hx     ROS: Review of Systems  Constitutional: Negative for weight loss.  Endo/Heme/Allergies:       Negative for polyphagia    PHYSICAL EXAM: Blood pressure 127/83, pulse 88, temperature 97.9 F (36.6 C), temperature source Oral, height 5\' 9"  (1.753 m), weight 253  lb (114.8 kg), SpO2 100 %. Body mass index is 37.36 kg/m. Physical Exam Vitals signs reviewed.  Constitutional:      Appearance: Normal appearance. She is obese.  Cardiovascular:     Rate and Rhythm: Normal rate.     Pulses: Normal pulses.  Pulmonary:     Effort: Pulmonary effort is normal.  Musculoskeletal: Normal range of motion.  Skin:    General: Skin is warm and dry.  Neurological:     Mental Status: She is alert and oriented to person, place, and time.  Psychiatric:        Mood and Affect: Mood normal.        Behavior: Behavior normal.     RECENT LABS AND TESTS: BMET    Component Value Date/Time   NA 139 12/29/2017 1051   K 4.0 12/29/2017 1051   CL 102 12/29/2017 1051    CO2 23 12/29/2017 1051   GLUCOSE 84 12/29/2017 1051   GLUCOSE 98 10/15/2017 1635   BUN 7 12/29/2017 1051   CREATININE 0.96 12/29/2017 1051   CALCIUM 9.3 12/29/2017 1051   GFRNONAA 81 12/29/2017 1051   GFRAA 94 12/29/2017 1051   Lab Results  Component Value Date   HGBA1C 5.5 12/29/2017   HGBA1C 5.8 08/07/2017   Lab Results  Component Value Date   INSULIN 21.8 12/29/2017   CBC    Component Value Date/Time   WBC 5.1 12/29/2017 1051   WBC 5.7 08/07/2017 0938   RBC 4.72 12/29/2017 1051   RBC 4.90 08/07/2017 0938   HGB 12.2 12/29/2017 1051   HCT 37.9 12/29/2017 1051   PLT 354.0 08/07/2017 0938   MCV 80 12/29/2017 1051   MCH 25.8 (L) 12/29/2017 1051   MCH 24.5 (L) 07/06/2016 0536   MCHC 32.2 12/29/2017 1051   MCHC 32.7 08/07/2017 0938   RDW 13.1 12/29/2017 1051   LYMPHSABS 1.9 12/29/2017 1051   EOSABS 0.2 12/29/2017 1051   BASOSABS 0.1 12/29/2017 1051   Iron/TIBC/Ferritin/ %Sat    Component Value Date/Time   IRON 30 12/29/2017 1051   TIBC 334 12/29/2017 1051   FERRITIN 12 (L) 12/29/2017 1051   IRONPCTSAT 9 (LL) 12/29/2017 1051   Lipid Panel     Component Value Date/Time   CHOL 235 (H) 12/29/2017 1051   TRIG 58 12/29/2017 1051   HDL 45 12/29/2017 1051   CHOLHDL 6 08/07/2017 0938   VLDL 22.0 08/07/2017 0938   LDLCALC 178 (H) 12/29/2017 1051   Hepatic Function Panel     Component Value Date/Time   PROT 7.4 12/29/2017 1051   ALBUMIN 4.5 12/29/2017 1051   AST 14 12/29/2017 1051   ALT 10 12/29/2017 1051   ALKPHOS 128 (H) 12/29/2017 1051   BILITOT 0.6 12/29/2017 1051      Component Value Date/Time   TSH 0.792 12/29/2017 1051   TSH 1.16 08/07/2017 0938   TSH 1.69 08/06/2016 0855    ASSESSMENT AND PLAN: Insulin resistance  Class 2 severe obesity with serious comorbidity and body mass index (BMI) of 37.0 to 37.9 in adult, unspecified obesity type ([place])  PLAN:  Insulin Resistance Nikira will continue to work on weight loss, exercise, and decreasing  simple carbohydrates in her diet to help decrease the risk of diabetes.  Ramiah agrees to continue taking metformin, and she agrees to follow up with our clinic in 3 weeks as directed to monitor her progress.  I spent > than 50% of the 15 minute visit on counseling as documented in the  note.  Obesity Lilliane is currently in the action stage of change. As such, her goal is to continue with weight loss efforts She has agreed to follow the Category 3 plan and journal 450 to 600 calories and 40+ grams of protein at supper daily. The "smart fruit handout", was provided. She will talk with her boss about getting a meal break. Amera has been instructed to start walking for 30 minutes 2 times a week. We discussed the following Behavioral Modification Strategies today: increasing lean protein intake, no skipping meals, Keeping healthy foods in the home, better snaking choices, travel eating strategies, and planning for success.  Cumi has agreed to follow up with our clinic in 3 weeks. She was informed of the importance of frequent follow up visits to maximize her success with intensive lifestyle modifications for her multiple health conditions.   OBESITY BEHAVIORAL INTERVENTION VISIT  Today's visit was # 7  Starting weight: 263 lbs Starting date: 12/29/17 Today's weight : 263 lbs  Today's date: 03/12/2018 Total lbs lost to date: 10  ASK: We discussed the diagnosis of obesity with Marcene Brawn today and Brieann agreed to give [place] permission to discuss obesity behavioral modification therapy today.  ASSESS: Randalyn has the diagnosis of obesity and her BMI today is 37.34 Aronda is in the action stage of change   ADVISE: Takerra was educated on the multiple health risks of obesity as well as the benefit of weight loss to improve her health. She was advised of the need for long term treatment and the importance of lifestyle modifications to improve her current health and to decrease her risk of future  health problems.  AGREE: Multiple dietary modification options and treatment options were discussed and  Jocelynn agreed to follow the recommendations documented in the above note.  ARRANGE: Sharrell was educated on the importance of frequent visits to treat obesity as outlined per CMS and USPSTF guidelines and agreed to schedule her next follow up appointment today.  I, Tammy Wysor, am acting as Location manager for [employer], FNP-C.  I have reviewed the above documentation for accuracy and completeness, and I agree with the above.  - Rodrigues Urbanek, FNP-C.

## 2018-03-16 ENCOUNTER — Encounter (INDEPENDENT_AMBULATORY_CARE_PROVIDER_SITE_OTHER): Payer: Self-pay | Admitting: Family Medicine

## 2018-03-23 DIAGNOSIS — F3341 Major depressive disorder, recurrent, in partial remission: Secondary | ICD-10-CM | POA: Diagnosis not present

## 2018-04-01 ENCOUNTER — Ambulatory Visit (INDEPENDENT_AMBULATORY_CARE_PROVIDER_SITE_OTHER): Payer: BLUE CROSS/BLUE SHIELD | Admitting: *Deleted

## 2018-04-01 ENCOUNTER — Ambulatory Visit (INDEPENDENT_AMBULATORY_CARE_PROVIDER_SITE_OTHER): Payer: BLUE CROSS/BLUE SHIELD | Admitting: Family Medicine

## 2018-04-01 ENCOUNTER — Encounter (INDEPENDENT_AMBULATORY_CARE_PROVIDER_SITE_OTHER): Payer: Self-pay | Admitting: Family Medicine

## 2018-04-01 VITALS — BP 135/81 | HR 88 | Temp 98.8°F | Ht 69.0 in | Wt 254.0 lb

## 2018-04-01 DIAGNOSIS — E559 Vitamin D deficiency, unspecified: Secondary | ICD-10-CM

## 2018-04-01 DIAGNOSIS — E8881 Metabolic syndrome: Secondary | ICD-10-CM

## 2018-04-01 DIAGNOSIS — Z23 Encounter for immunization: Secondary | ICD-10-CM

## 2018-04-01 DIAGNOSIS — Z9189 Other specified personal risk factors, not elsewhere classified: Secondary | ICD-10-CM | POA: Diagnosis not present

## 2018-04-01 DIAGNOSIS — F3341 Major depressive disorder, recurrent, in partial remission: Secondary | ICD-10-CM | POA: Diagnosis not present

## 2018-04-01 DIAGNOSIS — Z6837 Body mass index (BMI) 37.0-37.9, adult: Secondary | ICD-10-CM

## 2018-04-01 MED ORDER — METFORMIN HCL 500 MG PO TABS: 500.0000 mg | ORAL_TABLET | Freq: Every day | ORAL | 0 refills | Status: DC

## 2018-04-01 MED ORDER — VITAMIN D (ERGOCALCIFEROL) 1.25 MG (50000 UNIT) PO CAPS: 50000.0000 [IU] | ORAL_CAPSULE | ORAL | 0 refills | Status: DC

## 2018-04-02 ENCOUNTER — Encounter (INDEPENDENT_AMBULATORY_CARE_PROVIDER_SITE_OTHER): Payer: Self-pay | Admitting: Family Medicine

## 2018-04-02 DIAGNOSIS — E8881 Metabolic syndrome: Secondary | ICD-10-CM | POA: Insufficient documentation

## 2018-04-02 DIAGNOSIS — E88819 Insulin resistance, unspecified: Secondary | ICD-10-CM | POA: Insufficient documentation

## 2018-04-02 NOTE — Progress Notes (Signed)
Office: [phone]  /  Fax: [phone]   HPI:   Chief Complaint: OBESITY Shannon [name] is here to discuss her progress with her obesity treatment plan. She is on the Category 3 plan keeping a supper journal of 450 to 600 calories and 40+ grams of protein and is following her eating plan approximately 50 % of the time. She states she is walking 15 minutes 2 times per week. Shannon [name] has not been able to use her kitchen due to a broken sink. She has been off track and eating too much candy. She struggles with eating 8 to 10 ounces of protein for dinner.  Her weight is 254 lb (115.2 kg) today and has had a weight gain of 1 pound over a period of 3 weeks since her last visit. She has lost 9 lbs since starting treatment with [place].  Insulin Resistance Shannon [name] has a diagnosis of insulin resistance based on her elevated fasting insulin level >5. Although Shannon [name]'s blood glucose readings are still under good control, insulin resistance puts her at greater risk of metabolic syndrome and diabetes. She is taking metformin currently and continues to work on diet and exercise to decrease risk of diabetes. She denies polyphagia.  At risk for diabetes Shannon [name] is at higher than average risk for developing diabetes due to her insulin resistance and obesity. She currently denies polyuria or polydipsia.  Vitamin D deficiency Shannon [name] has a diagnosis of vitamin D deficiency. She is currently taking vit D and is not at goal. Her last vitamin D level was 12.7 on 12/29/17. She denies nausea, vomiting, or muscle weakness.  ASSESSMENT AND PLAN:  Insulin resistance - Plan: metFORMIN (GLUCOPHAGE) 500 MG tablet  Vitamin D deficiency - Plan: Vitamin D, Ergocalciferol, (DRISDOL) 1.25 MG (50000 UT) CAPS capsule  At risk for diabetes mellitus  Class 2 severe obesity with serious comorbidity and body mass index (BMI) of 37.0 to 37.9 in adult, unspecified obesity type ([place])  PLAN:  Insulin Resistance Shannon [name] will continue to work  on weight loss, exercise, and decreasing simple carbohydrates in her diet to help decrease the risk of diabetes Shannon [name] agreed to continue taking metformin 500mg  qAM #30 with no refills and prescription was written today. We will check her A1c and fasting Insulin at her next visit. Shannon [name] agreed to follow up with [place] as directed to monitor her progress in 2 weeks.  Diabetes risk counseling Shannon [name] was given extended (15 minutes) diabetes prevention counseling today. She is 28 y.o. female and has risk factors for diabetes including insulin resistance and obesity. We discussed intensive lifestyle modifications today with an emphasis on weight loss as well as increasing exercise and decreasing simple carbohydrates in her diet.  Vitamin D Deficiency Shannon [name] was informed that low vitamin D levels contributes to fatigue and are associated with obesity, breast, and colon cancer. She agrees to continue to take prescription Vit D @50 ,000 IU every week #4 with no refills and will follow up for routine testing of vitamin D, at least 2-3 times per year. She was informed of the risk of over-replacement of vitamin D and agrees to not increase her dose unless she discusses this with [place] first. We will check her vitamin D level at her next visit in 2 weeks. Shannon [name] agrees with this plan.  Obesity Shannon [name] is currently in the action stage of change. As such, her goal is to continue with weight loss efforts. She has agreed to keep a food journal with 450 to 600 calories and 40+ grams  of protein for dinner and follow the Category 3 plan. We discussed moving protein around so she can eat less protein for dinner. Shannon [name] has been instructed to increase walking to 3 days per week for 15 minutes. We discussed the following Behavioral Modification Strategies today: increasing lean protein intake, work on meal planning and easy cooking plans, better snacking choices, and keeping a strict food journal.  Shannon [name] has agreed to follow up  with our clinic in 2 weeks. She was informed of the importance of frequent follow up visits to maximize her success with intensive lifestyle modifications for her multiple health conditions.  ALLERGIES: Allergies  Allergen Reactions  . Bupropion Other (See Comments)    Reaction:  Constipation, states just the extended release    MEDICATIONS: Current Outpatient Medications on File Prior to Visit  Medication Sig Dispense Refill  . buPROPion (WELLBUTRIN) 100 MG tablet Take 100 mg by mouth 2 (two) times daily.    [name] losartan (COZAAR) 25 MG tablet Take 1 tablet (25 mg total) by mouth daily. 30 tablet 5  . norethindrone (ORTHO MICRONOR) 0.35 MG tablet Take 1 tablet (0.35 mg total) by mouth daily. 3 Package 4   No current facility-administered medications on file prior to visit.     PAST MEDICAL HISTORY: Past Medical History:  Diagnosis Date  . Anemia   . Anxiety   . Constipation   . Depression   . Dyspnea   . HTN (hypertension)   . Knee pain   . Leg edema   . Medical history non-contributory   . Obesity   . Pain in both feet   . Prolonged QT interval   . Pulmonary thromboembolism ([place])   . Suicide ideation     PAST SURGICAL HISTORY: Past Surgical History:  Procedure Laterality Date  . NO PAST SURGERIES    . TEETH REMOVAL  07/2017    SOCIAL HISTORY: Social History   Tobacco Use  . Smoking status: Never Smoker  . Smokeless tobacco: Never Used  Substance Use Topics  . Alcohol use: No  . Drug use: No    FAMILY HISTORY: Family History  Problem Relation Age of Onset  . Healthy Mother   . Hypertension Mother   . Obesity Mother   . Healthy Father   . Pulmonary embolism Neg Hx   . Deep vein thrombosis Neg Hx     ROS: Review of Systems  Constitutional: Negative for weight loss.  Gastrointestinal: Negative for nausea and vomiting.  Genitourinary:       Negative for polyuria.  Musculoskeletal:       Negative for muscle weakness.  Endo/Heme/Allergies: Negative  for polydipsia.    PHYSICAL EXAM: Blood pressure 135/81, pulse 88, temperature 98.8 F (37.1 C), temperature source Oral, height 5\' 9"  (1.753 m), weight 254 lb (115.2 kg), SpO2 99 %. Body mass index is 37.51 kg/m. Physical Exam Vitals signs reviewed.  Constitutional:      Appearance: Normal appearance. She is obese.  Cardiovascular:     Rate and Rhythm: Normal rate.  Pulmonary:     Effort: Pulmonary effort is normal.  Musculoskeletal: Normal range of motion.  Skin:    General: Skin is warm and dry.  Neurological:     Mental Status: She is alert and oriented to person, place, and time.  Psychiatric:        Mood and Affect: Mood normal.        Behavior: Behavior normal.     RECENT LABS AND TESTS: BMET  Component Value Date/Time   NA 139 12/29/2017 1051   K 4.0 12/29/2017 1051   CL 102 12/29/2017 1051   CO2 23 12/29/2017 1051   GLUCOSE 84 12/29/2017 1051   GLUCOSE 98 10/15/2017 1635   BUN 7 12/29/2017 1051   CREATININE 0.96 12/29/2017 1051   CALCIUM 9.3 12/29/2017 1051   GFRNONAA 81 12/29/2017 1051   GFRAA 94 12/29/2017 1051   Lab Results  Component Value Date   HGBA1C 5.5 12/29/2017   HGBA1C 5.8 08/07/2017   Lab Results  Component Value Date   INSULIN 21.8 12/29/2017   CBC    Component Value Date/Time   WBC 5.1 12/29/2017 1051   WBC 5.7 08/07/2017 0938   RBC 4.72 12/29/2017 1051   RBC 4.90 08/07/2017 0938   HGB 12.2 12/29/2017 1051   HCT 37.9 12/29/2017 1051   PLT 354.0 08/07/2017 0938   MCV 80 12/29/2017 1051   MCH 25.8 (L) 12/29/2017 1051   MCH 24.5 (L) 07/06/2016 0536   MCHC 32.2 12/29/2017 1051   MCHC 32.7 08/07/2017 0938   RDW 13.1 12/29/2017 1051   LYMPHSABS 1.9 12/29/2017 1051   EOSABS 0.2 12/29/2017 1051   BASOSABS 0.1 12/29/2017 1051   Iron/TIBC/Ferritin/ %Sat    Component Value Date/Time   IRON 30 12/29/2017 1051   TIBC 334 12/29/2017 1051   FERRITIN 12 (L) 12/29/2017 1051   IRONPCTSAT 9 (LL) 12/29/2017 1051   Lipid Panel       Component Value Date/Time   CHOL 235 (H) 12/29/2017 1051   TRIG 58 12/29/2017 1051   HDL 45 12/29/2017 1051   CHOLHDL 6 08/07/2017 0938   VLDL 22.0 08/07/2017 0938   LDLCALC 178 (H) 12/29/2017 1051   Hepatic Function Panel     Component Value Date/Time   PROT 7.4 12/29/2017 1051   ALBUMIN 4.5 12/29/2017 1051   AST 14 12/29/2017 1051   ALT 10 12/29/2017 1051   ALKPHOS 128 (H) 12/29/2017 1051   BILITOT 0.6 12/29/2017 1051      Component Value Date/Time   TSH 0.792 12/29/2017 1051   TSH 1.16 08/07/2017 0938   TSH 1.69 08/06/2016 0855   Results for TEEGAN, GUINTHER (MRN [id number]) as of 04/02/2018 12:09  Ref. Range 12/29/2017 10:51  Vitamin D, 25-Hydroxy Latest Ref Range: 30.0 - 100.0 ng/mL 12.7 (L)    OBESITY BEHAVIORAL INTERVENTION VISIT  Today's visit was # 8   Starting weight: 263 lbs Starting date: 12/29/17 Today's weight : Weight: 254 lb (115.2 kg)  Today's date: 04/01/2018 Total lbs lost to date: 9  ASK: We discussed the diagnosis of obesity with Shannon [name] today and Shannon [name] agreed to give [place] permission to discuss obesity behavioral modification therapy today.  ASSESS: Idil has the diagnosis of obesity and her BMI today is 37.4. Shannon [name] is in the action stage of change.   ADVISE: Linnaea was educated on the multiple health risks of obesity as well as the benefit of weight loss to improve her health. She was advised of the need for long term treatment and the importance of lifestyle modifications to improve her current health and to decrease her risk of future health problems.  AGREE: Multiple dietary modification options and treatment options were discussed and Ravon agreed to follow the recommendations documented in the above note.  ARRANGE: Maille was educated on the importance of frequent visits to treat obesity as outlined per CMS and USPSTF guidelines and agreed to schedule her next follow up appointment today.  I, Shannon [name],  am acting as Location manager  for [employer], FNP-C.  I have reviewed the above documentation for accuracy and completeness, and I agree with the above.  - Shannon Nishi, FNP-C.

## 2018-04-08 DIAGNOSIS — F3341 Major depressive disorder, recurrent, in partial remission: Secondary | ICD-10-CM | POA: Diagnosis not present

## 2018-04-15 DIAGNOSIS — F3341 Major depressive disorder, recurrent, in partial remission: Secondary | ICD-10-CM | POA: Diagnosis not present

## 2018-04-17 ENCOUNTER — Encounter: Payer: Self-pay | Admitting: Nurse Practitioner

## 2018-04-17 ENCOUNTER — Ambulatory Visit (INDEPENDENT_AMBULATORY_CARE_PROVIDER_SITE_OTHER): Payer: BLUE CROSS/BLUE SHIELD | Admitting: Nurse Practitioner

## 2018-04-17 VITALS — BP 120/82 | HR 83 | Ht 69.0 in | Wt 252.0 lb

## 2018-04-17 DIAGNOSIS — H6122 Impacted cerumen, left ear: Secondary | ICD-10-CM | POA: Diagnosis not present

## 2018-04-17 DIAGNOSIS — I10 Essential (primary) hypertension: Secondary | ICD-10-CM | POA: Diagnosis not present

## 2018-04-17 MED ORDER — LOSARTAN POTASSIUM 25 MG PO TABS: 25.0000 mg | ORAL_TABLET | Freq: Every day | ORAL | 2 refills | Status: DC

## 2018-04-17 NOTE — Assessment & Plan Note (Signed)
Stable Continue losartan at current dosage Continue healthy weight and wellness RTC in 6 months for F/U, recheck BP, sooner for readings >140/90 - losartan (COZAAR) 25 MG tablet; Take 1 tablet (25 mg total) by mouth daily.  Dispense: 90 tablet; Refill: 2

## 2018-04-17 NOTE — Assessment & Plan Note (Deleted)
Stable Continue losartan at current dosage Continue healthy weight and wellness RTC in 6 months for F/U, recheck BP, sooner for readings >140/90 - losartan (COZAAR) 25 MG tablet; Take 1 tablet (25 mg total) by mouth daily.  Dispense: 90 tablet; Refill: 2

## 2018-04-17 NOTE — Progress Notes (Signed)
Patient consent obtained. Irrigation with water and peroxide performed. Full view of tympanic membranes after procedure.  Patient tolerated procedure well.   

## 2018-04-17 NOTE — Progress Notes (Signed)
Shannon [name] is a 28 y.o. female with the following history as recorded in Epic[employer]:  Patient Active Problem List   Diagnosis Date Noted  . Insulin resistance 04/02/2018  . Shortness of breath on exertion 12/29/2017  . Essential hypertension 12/29/2017  . Vitamin D deficiency 12/29/2017  . Class 2 severe obesity with serious comorbidity and body mass index (BMI) of 37.0 to 37.9 in adult ([place]) 08/11/2017  . Routine general medical examination at a health care facility 08/10/2017  . Hypertension 12/30/2016  . Slurred speech 12/30/2016  . Iron deficiency anemia 09/24/2016  . Fatigue 08/06/2016  . Pulmonary embolism with acute cor pulmonale ([place]) 07/04/2016  . Hypokalemia 07/04/2016  . Prolonged QT interval 07/04/2016  . Depression 07/04/2016  . Adjustment disorder with emotional disturbance 03/31/2016  . Major depressive disorder, recurrent severe without psychotic features ([place]) 01/10/2016  . Major depressive disorder, recurrent episode, mild ([place]) 01/01/2016  . Suicide ideation 02/16/2013    Current Outpatient Medications  Medication Sig Dispense Refill  . buPROPion (WELLBUTRIN) 100 MG tablet Take 100 mg by mouth 2 (two) times daily.    [name] losartan (COZAAR) 25 MG tablet Take 1 tablet (25 mg total) by mouth daily. 90 tablet 2  . metFORMIN (GLUCOPHAGE) 500 MG tablet Take 1 tablet (500 mg total) by mouth daily with breakfast. 30 tablet 0  . norethindrone (ORTHO MICRONOR) 0.35 MG tablet Take 1 tablet (0.35 mg total) by mouth daily. 3 Package 4  . Vitamin D, Ergocalciferol, (DRISDOL) 1.25 MG (50000 UT) CAPS capsule Take 1 capsule (50,000 Units total) by mouth every 7 (seven) days. 4 capsule 0   No current facility-administered medications for this visit.     Allergies: Bupropion  Past Medical History:  Diagnosis Date  . Anemia   . Anxiety   . Constipation   . Depression   . Dyspnea   . HTN (hypertension)   . Knee pain   . Leg edema   . Medical history non-contributory   .  Obesity   . Pain in both feet   . Prolonged QT interval   . Pulmonary thromboembolism ([place])   . Suicide ideation     Past Surgical History:  Procedure Laterality Date  . NO PAST SURGERIES    . TEETH REMOVAL  07/2017    Family History  Problem Relation Age of Onset  . Healthy Mother   . Hypertension Mother   . Obesity Mother   . Healthy Father   . Pulmonary embolism Neg Hx   . Deep vein thrombosis Neg Hx     Social History   Tobacco Use  . Smoking status: Never Smoker  . Smokeless tobacco: Never Used  Substance Use Topics  . Alcohol use: No     Subjective:   Shannon [name] is here today for routine follow up of blood pressure, maintained on losartan 25 daily. Reports daily medication compliance without noted adverse medication effects. Since our last visit, she has started following with [facility], lost about 30 lbs, working hard on diet and exercise.  Denies weakness, dizziness, headaches, vision changes, chest pain, shortness of breath, edema.  BP Readings from Last 3 Encounters:  04/17/18 120/88  04/01/18 135/81  03/12/18 127/83   Also requesting evaluation of decreased hearing, left ear, has had earwax impaction in past and this feels the same, trying debrox at home without improvement. No fevers, ear pain, drainage.   ROS- See HPI  Objective:  Vitals:   04/17/18 0955 04/17/18  1051  BP: 120/90 120/88  Pulse: 83   SpO2: 98%   Weight: 252 lb (114.3 kg)   Height: 5\' 9"  (1.753 m)     General: Well developed, well nourished, in no acute distress  Skin : Warm and dry.  Head: Normocephalic and atraumatic  Eyes: Sclera and conjunctiva clear; pupils round and reactive to light; extraocular movements intact  Ears: External normal; canals clear; tympanic membranes normal  Left canal impacted with copious hard wax, examination post ear lavage canal is clear and no bleeding or complications noted. Oropharynx: Pink, supple. No suspicious lesions   Neck: Supple without thyromegaly  Lungs: Respirations unlabored; clear to auscultation bilaterally without wheeze, rales, rhonchi  CVS exam: normal rate and regular rhythm, S1 and S2 normal.  Extremities: No edema, cyanosis, clubbing  Vessels: Symmetric bilaterally  Neurologic: Alert and oriented; speech intact; face symmetrical; moves all extremities well; CNII-XII intact without focal deficit  Psychiatric: Normal mood and affect.  Assessment:  1. Essential hypertension   2. Impacted cerumen of left ear     Plan:   Impacted cerumen of left ear Irrigation/lavage of bilateral ears with removal of impacted cerumen Patient tolerated well F/u for new or worsening symptoms  Return in about 6 months (around 10/16/2018) for CPE, BP follow up.  No orders of the defined types were placed in this encounter.   Requested Prescriptions   Signed Prescriptions Disp Refills  . losartan (COZAAR) 25 MG tablet 90 tablet 2    Sig: Take 1 tablet (25 mg total) by mouth daily.

## 2018-04-17 NOTE — Patient Instructions (Addendum)
Good job on weight!  I will see you back in 6 months, or sooner if needed!

## 2018-04-22 ENCOUNTER — Encounter (INDEPENDENT_AMBULATORY_CARE_PROVIDER_SITE_OTHER): Payer: Self-pay | Admitting: Family Medicine

## 2018-04-22 ENCOUNTER — Ambulatory Visit (INDEPENDENT_AMBULATORY_CARE_PROVIDER_SITE_OTHER): Payer: BLUE CROSS/BLUE SHIELD | Admitting: Family Medicine

## 2018-04-22 VITALS — BP 122/82 | HR 84 | Temp 98.3°F | Ht 69.0 in | Wt 250.0 lb

## 2018-04-22 DIAGNOSIS — F3341 Major depressive disorder, recurrent, in partial remission: Secondary | ICD-10-CM | POA: Diagnosis not present

## 2018-04-22 DIAGNOSIS — D508 Other iron deficiency anemias: Secondary | ICD-10-CM

## 2018-04-22 DIAGNOSIS — Z6836 Body mass index (BMI) 36.0-36.9, adult: Secondary | ICD-10-CM

## 2018-04-22 DIAGNOSIS — E559 Vitamin D deficiency, unspecified: Secondary | ICD-10-CM | POA: Diagnosis not present

## 2018-04-22 DIAGNOSIS — E8881 Metabolic syndrome: Secondary | ICD-10-CM | POA: Diagnosis not present

## 2018-04-22 DIAGNOSIS — Z9189 Other specified personal risk factors, not elsewhere classified: Secondary | ICD-10-CM

## 2018-04-22 DIAGNOSIS — E7849 Other hyperlipidemia: Secondary | ICD-10-CM | POA: Insufficient documentation

## 2018-04-22 NOTE — Progress Notes (Signed)
Office: [phone]  /  Fax: [phone]   HPI:   Chief Complaint: OBESITY Shannon [name] is here to discuss her progress with her obesity treatment plan. She is on the Category 3 plan and is following her eating plan approximately 25 % of the time. She states she is using the elliptical 25 minutes 3 times per week. Mayzee has had increased stress due to car troubles which leads to her skipping meals. She has not eaten candy recently which she had previous;y been struggling with.  Her weight is 250 lb (113.4 kg) today and has had a weight loss of 4 pounds over a period of 3 weeks since her last visit. She has lost 13 lbs since starting treatment with [place].  Insulin Resistance [name] has a diagnosis of insulin resistance based on her elevated fasting insulin level >5. Although [name]'s blood glucose readings are still under good control, insulin resistance puts her at greater risk of metabolic syndrome and diabetes. She is taking metformin currently and continues to work on diet and exercise to decrease risk of diabetes. She denies polyphagia.  Vitamin D deficiency Dezarae has a diagnosis of vitamin D deficiency. She is currently taking vit D and is not at goal. Her last vitamin D was 12.7 on 12/29/17. She admits fatigue and denies nausea, vomiting, or muscle weakness.  At risk for osteopenia and osteoporosis [name] is at higher risk of osteopenia and osteoporosis due to vitamin D deficiency.   Iron Deficiency Anemia [name] has a diagnosis of anemia. She notes fatigue and is not on iron supplementation. Her last ferritin and iron saturation were below normal.  Hyperlipidemia [name] has hyperlipidemia and has been trying to improve her cholesterol levels with intensive lifestyle modification including a low saturated fat diet, exercise and weight loss. Her last LDL was 178, triglycerides was 58, and HDL was 45 on 12/29/17. She is not on a statin and denies any chest pain or shortness of  breath.  ASSESSMENT AND PLAN:  Insulin resistance - Plan: Comprehensive metabolic panel, Hemoglobin A1c, Insulin, random  Vitamin D deficiency - Plan: VITAMIN D 25 Hydroxy (Vit-D Deficiency, Fractures)  Other iron deficiency anemia - Plan: Anemia panel, CBC With Differential  Other hyperlipidemia - Plan: Lipid Panel With LDL/HDL Ratio  At risk for osteoporosis  Class 2 severe obesity with serious comorbidity and body mass index (BMI) of 36.0 to 36.9 in adult, unspecified obesity type ([place])  PLAN:  Insulin Resistance [name] will continue to work on weight loss, exercise, and decreasing simple carbohydrates in her diet to help decrease the risk of diabetes. She was informed that eating too many simple carbohydrates or too many calories at one sitting increases the likelihood of GI side effects. [name] agreed to continue metformin and prescription was not written today. We will order an A1c, a fasting glucose, and a fasting Insulin today and [name] agreed to follow up with [place] as directed to monitor her progress.  Vitamin D Deficiency [name] was informed that low vitamin D levels contributes to fatigue and are associated with obesity, breast, and colon cancer. She agrees to continue to take prescription Vit D @50 ,000 IU every week and will follow up for routine testing of vitamin D, at least 2-3 times per year. She was informed of the risk of over-replacement of vitamin D and agrees to not increase her dose unless she discusses this with [place] first. We will check her vitamin D level today and she agrees to follow up as directed.  At  risk for osteopenia and osteoporosis [name] was given extended (15 minutes) osteoporosis prevention counseling today. [name] is at risk for osteopenia and osteoporosis due to her vitamin D deficiency. She was encouraged to take her vitamin D and follow her higher calcium diet and increase strengthening exercise to help strengthen her bones and decrease her risk of  osteopenia and osteoporosis.  Iron Deficiency Anemia [name] will have a CBC and an anemia profile drawn today and agrees to follow up at the agreed upon time.  Hyperlipidemia [name] was informed of the American Heart Association Guidelines emphasizing intensive lifestyle modifications as the first line treatment for hyperlipidemia. We discussed many lifestyle modifications today in depth, and [name] will continue to work on decreasing saturated fats such as fatty red meat, butter and many fried foods. She will also increase vegetables and lean protein in her diet and continue to work on exercise and weight loss efforts. We will order a FLP today and she agrees to follow up as directed.  Obesity [name] is currently in the action stage of change. As such, her goal is to continue with weight loss efforts. She has agreed to follow the Category 3 plan. [name] has been instructed to continue to use the elliptical 3 times per week. We discussed the following Behavioral Modification Strategies today: increasing lean protein intake, increase H2O intake, no skipping meals, planning for success.  [name] has agreed to follow up with our clinic in 2 weeks. She was informed of the importance of frequent follow up visits to maximize her success with intensive lifestyle modifications for her multiple health conditions.  ALLERGIES: Allergies  Allergen Reactions  . Bupropion Other (See Comments)    Reaction:  Constipation, states just the extended release    MEDICATIONS: Current Outpatient Medications on File Prior to Visit  Medication Sig Dispense Refill  . buPROPion (WELLBUTRIN) 100 MG tablet Take 100 mg by mouth 2 (two) times daily.    [name] losartan (COZAAR) 25 MG tablet Take 1 tablet (25 mg total) by mouth daily. 90 tablet 2  . metFORMIN (GLUCOPHAGE) 500 MG tablet Take 1 tablet (500 mg total) by mouth daily with breakfast. 30 tablet 0  . norethindrone (ORTHO MICRONOR) 0.35 MG tablet Take 1 tablet (0.35  mg total) by mouth daily. 3 Package 4  . Vitamin D, Ergocalciferol, (DRISDOL) 1.25 MG (50000 UT) CAPS capsule Take 1 capsule (50,000 Units total) by mouth every 7 (seven) days. 4 capsule 0   No current facility-administered medications on file prior to visit.     PAST MEDICAL HISTORY: Past Medical History:  Diagnosis Date  . Anemia   . Anxiety   . Constipation   . Depression   . Dyspnea   . HTN (hypertension)   . Knee pain   . Leg edema   . Medical history non-contributory   . Obesity   . Pain in both feet   . Prolonged QT interval   . Pulmonary thromboembolism ([place])   . Suicide ideation     PAST SURGICAL HISTORY: Past Surgical History:  Procedure Laterality Date  . NO PAST SURGERIES    . TEETH REMOVAL  07/2017    SOCIAL HISTORY: Social History   Tobacco Use  . Smoking status: Never Smoker  . Smokeless tobacco: Never Used  Substance Use Topics  . Alcohol use: No  . Drug use: No    FAMILY HISTORY: Family History  Problem Relation Age of Onset  . Healthy Mother   . Hypertension Mother   .  Obesity Mother   . Healthy Father   . Pulmonary embolism Neg Hx   . Deep vein thrombosis Neg Hx     ROS: Review of Systems  Constitutional: Positive for malaise/fatigue and weight loss.  Respiratory: Negative for shortness of breath.   Cardiovascular: Negative for chest pain.  Gastrointestinal: Negative for nausea and vomiting.  Musculoskeletal:       Negative for muscle weakness.  Endo/Heme/Allergies:       Negative for polyphagia.    PHYSICAL EXAM: Blood pressure 122/82, pulse 84, temperature 98.3 F (36.8 C), temperature source Oral, height 5\' 9"  (1.753 m), weight 250 lb (113.4 kg), SpO2 99 %. Body mass index is 36.92 kg/m. Physical Exam Vitals signs reviewed.  Constitutional:      Appearance: Normal appearance. She is obese.  Cardiovascular:     Rate and Rhythm: Normal rate.  Pulmonary:     Effort: Pulmonary effort is normal.  Musculoskeletal: Normal  range of motion.  Skin:    General: Skin is warm and dry.  Neurological:     Mental Status: She is alert and oriented to person, place, and time.  Psychiatric:        Mood and Affect: Mood normal.        Behavior: Behavior normal.     RECENT LABS AND TESTS: BMET    Component Value Date/Time   NA 139 12/29/2017 1051   K 4.0 12/29/2017 1051   CL 102 12/29/2017 1051   CO2 23 12/29/2017 1051   GLUCOSE 84 12/29/2017 1051   GLUCOSE 98 10/15/2017 1635   BUN 7 12/29/2017 1051   CREATININE 0.96 12/29/2017 1051   CALCIUM 9.3 12/29/2017 1051   GFRNONAA 81 12/29/2017 1051   GFRAA 94 12/29/2017 1051   Lab Results  Component Value Date   HGBA1C 5.5 12/29/2017   HGBA1C 5.8 08/07/2017   Lab Results  Component Value Date   INSULIN 21.8 12/29/2017   CBC    Component Value Date/Time   WBC 5.1 12/29/2017 1051   WBC 5.7 08/07/2017 0938   RBC 4.72 12/29/2017 1051   RBC 4.90 08/07/2017 0938   HGB 12.2 12/29/2017 1051   HCT 37.9 12/29/2017 1051   PLT 354.0 08/07/2017 0938   MCV 80 12/29/2017 1051   MCH 25.8 (L) 12/29/2017 1051   MCH 24.5 (L) 07/06/2016 0536   MCHC 32.2 12/29/2017 1051   MCHC 32.7 08/07/2017 0938   RDW 13.1 12/29/2017 1051   LYMPHSABS 1.9 12/29/2017 1051   EOSABS 0.2 12/29/2017 1051   BASOSABS 0.1 12/29/2017 1051   Iron/TIBC/Ferritin/ %Sat    Component Value Date/Time   IRON 30 12/29/2017 1051   TIBC 334 12/29/2017 1051   FERRITIN 12 (L) 12/29/2017 1051   IRONPCTSAT 9 (LL) 12/29/2017 1051   Lipid Panel     Component Value Date/Time   CHOL 235 (H) 12/29/2017 1051   TRIG 58 12/29/2017 1051   HDL 45 12/29/2017 1051   CHOLHDL 6 08/07/2017 0938   VLDL 22.0 08/07/2017 0938   LDLCALC 178 (H) 12/29/2017 1051   Hepatic Function Panel     Component Value Date/Time   PROT 7.4 12/29/2017 1051   ALBUMIN 4.5 12/29/2017 1051   AST 14 12/29/2017 1051   ALT 10 12/29/2017 1051   ALKPHOS 128 (H) 12/29/2017 1051   BILITOT 0.6 12/29/2017 1051      Component  Value Date/Time   TSH 0.792 12/29/2017 1051   TSH 1.16 08/07/2017 0938   TSH 1.69 08/06/2016 0855  Results for KADENCE, MIKKELSON (MRN [id number]) as of 04/22/2018 15:28  Ref. Range 12/29/2017 10:51  Vitamin D, 25-Hydroxy Latest Ref Range: 30.0 - 100.0 ng/mL 12.7 (L)   OBESITY BEHAVIORAL INTERVENTION VISIT  Today's visit was # 9   Starting weight: 263 lbs Starting date: 12/29/17 Today's weight : Weight: 250 lb (113.4 kg)  Today's date: 04/22/2018 Total lbs lost to date: 13  ASK: We discussed the diagnosis of obesity with Marcene Brawn today and Nanetta agreed to give [place] permission to discuss obesity behavioral modification therapy today.  ASSESS: Marquerite has the diagnosis of obesity and her BMI today is 36.9. Onell is in the action stage of change.   ADVISE: Shian was educated on the multiple health risks of obesity as well as the benefit of weight loss to improve her health. She was advised of the need for long term treatment and the importance of lifestyle modifications to improve her current health and to decrease her risk of future health problems.  AGREE: Multiple dietary modification options and treatment options were discussed and Lajuan agreed to follow the recommendations documented in the above note.  ARRANGE: Sarahy was educated on the importance of frequent visits to treat obesity as outlined per CMS and USPSTF guidelines and agreed to schedule her next follow up appointment today.  Lenward Chancellor, CMA, am acting as Location manager for [employer], FNP-C.  I have reviewed the above documentation for accuracy and completeness, and I agree with the above.  - Marcine Gadway, FNP-C.

## 2018-04-24 LAB — COMPREHENSIVE METABOLIC PANEL
ALT: 9 IU/L (ref 0–32)
AST: 11 IU/L (ref 0–40)
Albumin/Globulin Ratio: 1.6 (ref 1.2–2.2)
Albumin: 4.4 g/dL (ref 3.9–5.0)
Alkaline Phosphatase: 123 IU/L — ABNORMAL HIGH (ref 39–117)
BUN/Creatinine Ratio: 11 (ref 9–23)
BUN: 11 mg/dL (ref 6–20)
Bilirubin Total: 0.5 mg/dL (ref 0.0–1.2)
CO2: 22 mmol/L (ref 20–29)
Calcium: 9.4 mg/dL (ref 8.7–10.2)
Chloride: 105 mmol/L (ref 96–106)
Creatinine, Ser: 1 mg/dL (ref 0.57–1.00)
GFR calc Af Amer: 89 mL/min/{1.73_m2} (ref >59)
GFR calc non Af Amer: 77 mL/min/{1.73_m2} (ref >59)
Globulin, Total: 2.7 g/dL (ref 1.5–4.5)
Glucose: 84 mg/dL (ref 65–99)
Potassium: 4.4 mmol/L (ref 3.5–5.2)
Sodium: 140 mmol/L (ref 134–144)
Total Protein: 7.1 g/dL (ref 6.0–8.5)

## 2018-04-24 LAB — VITAMIN D 25 HYDROXY (VIT D DEFICIENCY, FRACTURES): Vit D, 25-Hydroxy: 57.2 ng/mL (ref 30.0–100.0)

## 2018-04-24 LAB — CBC WITH DIFFERENTIAL
Basophils Absolute: 0.1 10*3/uL (ref 0.0–0.2)
Basos: 1 %
EOS (ABSOLUTE): 0.2 10*3/uL (ref 0.0–0.4)
Eos: 3 %
Hemoglobin: 11.9 g/dL (ref 11.1–15.9)
Immature Grans (Abs): 0 10*3/uL (ref 0.0–0.1)
Immature Granulocytes: 0 %
Lymphocytes Absolute: 2 10*3/uL (ref 0.7–3.1)
Lymphs: 40 %
MCH: 25.3 pg — ABNORMAL LOW (ref 26.6–33.0)
MCHC: 31.4 g/dL — ABNORMAL LOW (ref 31.5–35.7)
MCV: 81 fL (ref 79–97)
Monocytes Absolute: 0.3 10*3/uL (ref 0.1–0.9)
Monocytes: 6 %
Neutrophils Absolute: 2.4 10*3/uL (ref 1.4–7.0)
Neutrophils: 50 %
RBC: 4.7 x10E6/uL (ref 3.77–5.28)
RDW: 13.8 % (ref 11.7–15.4)
WBC: 5 10*3/uL (ref 3.4–10.8)

## 2018-04-24 LAB — ANEMIA PANEL
Ferritin: 15 ng/mL (ref 15–150)
Folate, Hemolysate: 315 ng/mL
Folate, RBC: 831 ng/mL (ref >498)
Hematocrit: 37.9 % (ref 34.0–46.6)
Iron Saturation: 11 % — ABNORMAL LOW (ref 15–55)
Iron: 36 ug/dL (ref 27–159)
Retic Ct Pct: 0.8 % (ref 0.6–2.6)
Total Iron Binding Capacity: 340 ug/dL (ref 250–450)
UIBC: 304 ug/dL (ref 131–425)
Vitamin B-12: 480 pg/mL (ref 232–1245)

## 2018-04-24 LAB — INSULIN, RANDOM: INSULIN: 11.9 u[IU]/mL (ref 2.6–24.9)

## 2018-04-24 LAB — LIPID PANEL WITH LDL/HDL RATIO
Cholesterol, Total: 193 mg/dL (ref 100–199)
HDL: 43 mg/dL (ref >39)
LDL Calculated: 142 mg/dL — ABNORMAL HIGH (ref 0–99)
LDl/HDL Ratio: 3.3 ratio — ABNORMAL HIGH (ref 0.0–3.2)
Triglycerides: 42 mg/dL (ref 0–149)
VLDL Cholesterol Cal: 8 mg/dL (ref 5–40)

## 2018-04-24 LAB — HEMOGLOBIN A1C
Est. average glucose Bld gHb Est-mCnc: 105 mg/dL
Hgb A1c MFr Bld: 5.3 % (ref 4.8–5.6)

## 2018-04-28 DIAGNOSIS — F3341 Major depressive disorder, recurrent, in partial remission: Secondary | ICD-10-CM | POA: Diagnosis not present

## 2018-05-06 ENCOUNTER — Ambulatory Visit (INDEPENDENT_AMBULATORY_CARE_PROVIDER_SITE_OTHER): Payer: BLUE CROSS/BLUE SHIELD | Admitting: Family Medicine

## 2018-05-06 ENCOUNTER — Encounter (INDEPENDENT_AMBULATORY_CARE_PROVIDER_SITE_OTHER): Payer: Self-pay | Admitting: Family Medicine

## 2018-05-06 VITALS — BP 127/83 | HR 85 | Temp 98.4°F | Ht 69.0 in | Wt 257.0 lb

## 2018-05-06 DIAGNOSIS — Z6837 Body mass index (BMI) 37.0-37.9, adult: Secondary | ICD-10-CM

## 2018-05-06 DIAGNOSIS — D508 Other iron deficiency anemias: Secondary | ICD-10-CM

## 2018-05-06 DIAGNOSIS — F332 Major depressive disorder, recurrent severe without psychotic features: Secondary | ICD-10-CM | POA: Diagnosis not present

## 2018-05-06 DIAGNOSIS — F3341 Major depressive disorder, recurrent, in partial remission: Secondary | ICD-10-CM | POA: Diagnosis not present

## 2018-05-06 DIAGNOSIS — E8881 Metabolic syndrome: Secondary | ICD-10-CM

## 2018-05-06 DIAGNOSIS — E559 Vitamin D deficiency, unspecified: Secondary | ICD-10-CM | POA: Diagnosis not present

## 2018-05-06 DIAGNOSIS — Z9189 Other specified personal risk factors, not elsewhere classified: Secondary | ICD-10-CM

## 2018-05-06 DIAGNOSIS — E669 Obesity, unspecified: Secondary | ICD-10-CM

## 2018-05-06 MED ORDER — VITAMIN D (ERGOCALCIFEROL) 50 MCG (2000 UT) PO CAPS: 2000.0000 [IU] | ORAL_CAPSULE | Freq: Every day | ORAL | 0 refills | Status: DC

## 2018-05-06 MED ORDER — METFORMIN HCL 500 MG PO TABS: 500.0000 mg | ORAL_TABLET | Freq: Every day | ORAL | 0 refills | Status: DC

## 2018-05-06 NOTE — Progress Notes (Signed)
Office: [phone]  /  Fax: [phone]   HPI:   Chief Complaint: OBESITY Shannon [name] is here to discuss her progress with her obesity treatment plan. She is on the Category 3 plan and is following her eating plan approximately 25% of the time. She states she is exercising 0 minutes 0 times per week. Shannon [name] is eating too many sweets and saltine crackers and not eating enough protein. Her weight is 257 lb (116.6 kg) today and has had a weight gain of 7 pounds since her last visit. She has lost 6 lbs since starting treatment with [place].  Vitamin D deficiency Shannon [name] has a diagnosis of Vitamin D deficiency. She is currently taking prescription Vit D and denies nausea, vomiting or muscle weakness. Shannon [name]'s Vitamin D level is now at goal at 57.2 on 04/22/2018.  Insulin Resistance Shannon [name] has a diagnosis of insulin resistance based on her elevated fasting insulin level >5. Although Shannon [name] blood glucose readings are still under good control, insulin resistance puts her at greater risk of metabolic syndrome and diabetes. She is taking metformin currently and continues to work on diet and exercise to decrease risk of diabetes. Shannon [name] denies polyphagia.  Iron Deficiency Anemia Shannon [name] has a history of iron deficiency anemia. Her iron saturation level was down at 11 on 04/22/2018. Ferritin was low normal. Hemoglobin/hematocrit are normal.  At risk for diabetes Shannon [name] is at higher than average risk for developing diabetes due to her obesity. She currently denies polyuria or polydipsia.  ASSESSMENT AND PLAN:  Vitamin D deficiency - Plan: Vitamin D, Ergocalciferol, 75 MCG (2000 UT) CAPS  Insulin resistance - Plan: metFORMIN (GLUCOPHAGE) 500 MG tablet  Other iron deficiency anemia  At risk for diabetes mellitus  Class 2 obesity without serious comorbidity with body mass index (BMI) of 37.0 to 37.9 in adult, unspecified obesity type  PLAN:  Vitamin D Deficiency Shannon [name] was informed that low Vitamin  D levels contributes to fatigue and are associated with obesity, breast, and colon cancer. Kitrina will discontinue prescription Vitamin D and start over-the-counter Vitamin D 2000 IU daily and will follow-up for routine testing of Vitamin D, at least 2-3 times per year. She was informed of the risk of over-replacement of Vitamin D and agrees to not increase her dose unless she discusses this with [place] first. Shannon [name] agrees to follow-up with our clinic in 2 weeks.  Insulin Resistance Shannon [name] will continue to work on weight loss, exercise, and decreasing simple carbohydrates in her diet to help decrease the risk of diabetes.  Shannon [name] will continue metformin and prescription was written today for 500 mg qam #30 with no refills. Shannon [name] agrees to follow-up with our clinic in 2 weeks.  Iron Deficiency Anemia Shannon [name] will start [place] Complete with Iron 1 daily and will eat all meat on her meal plan.   Diabetes risk counseling Shannon [name] was given extended (15 minutes) diabetes prevention counseling today. She is 28 y.o. female and has risk factors for diabetes including obesity. We discussed intensive lifestyle modifications today with an emphasis on weight loss as well as increasing exercise and decreasing simple carbohydrates in her diet.  Obesity Shannon [name] is currently in the action stage of change. As such, her goal is to continue with weight loss efforts She has agreed to follow the Category 3 plan and journal 450-600 calories + 40 grams of protein daily at supper. Shannon [name] has not been prescribed exercise at this time. We discussed the following Behavioral Modification Stratagies today: increasing lean protein intake, decreasing simple  carbohydrates, increasing vegetables, and planning for success.  Shannon [name] has agreed to follow up with our clinic in 2 weeks. She was informed of the importance of frequent follow up visits to maximize her success with intensive lifestyle modifications for her multiple health  conditions.  ALLERGIES: Allergies  Allergen Reactions  . Bupropion Other (See Comments)    Reaction:  Constipation, states just the extended release    MEDICATIONS: Current Outpatient Medications on File Prior to Visit  Medication Sig Dispense Refill  . buPROPion (WELLBUTRIN) 100 MG tablet Take 100 mg by mouth 2 (two) times daily.    [name] losartan (COZAAR) 25 MG tablet Take 1 tablet (25 mg total) by mouth daily. 90 tablet 2  . norethindrone (ORTHO MICRONOR) 0.35 MG tablet Take 1 tablet (0.35 mg total) by mouth daily. 3 Package 4   No current facility-administered medications on file prior to visit.     PAST MEDICAL HISTORY: Past Medical History:  Diagnosis Date  . Anemia   . Anxiety   . Constipation   . Depression   . Dyspnea   . HTN (hypertension)   . Knee pain   . Leg edema   . Medical history non-contributory   . Obesity   . Pain in both feet   . Prolonged QT interval   . Pulmonary thromboembolism ([place])   . Suicide ideation     PAST SURGICAL HISTORY: Past Surgical History:  Procedure Laterality Date  . NO PAST SURGERIES    . TEETH REMOVAL  07/2017    SOCIAL HISTORY: Social History   Tobacco Use  . Smoking status: Never Smoker  . Smokeless tobacco: Never Used  Substance Use Topics  . Alcohol use: No  . Drug use: No    FAMILY HISTORY: Family History  Problem Relation Age of Onset  . Healthy Mother   . Hypertension Mother   . Obesity Mother   . Healthy Father   . Pulmonary embolism Neg Hx   . Deep vein thrombosis Neg Hx    ROS: Review of Systems  Constitutional: Negative for weight loss.  Gastrointestinal: Negative for nausea and vomiting.  Musculoskeletal:       Negative for muscle weakness.  Endo/Heme/Allergies:       Negative for polyphagia. Negative for hypoglycemia. Positive for iron deficiency anemia.   PHYSICAL EXAM: Blood pressure 127/83, pulse 85, temperature 98.4 F (36.9 C), temperature source Oral, height 5\' 9"  (1.753 m), weight  257 lb (116.6 kg), SpO2 100 %. Body mass index is 37.95 kg/m. Physical Exam Vitals signs reviewed.  Constitutional:      Appearance: Normal appearance. She is obese.  Cardiovascular:     Rate and Rhythm: Normal rate.     Pulses: Normal pulses.  Pulmonary:     Effort: Pulmonary effort is normal.     Breath sounds: Normal breath sounds.  Musculoskeletal: Normal range of motion.  Skin:    General: Skin is warm and dry.  Neurological:     Mental Status: She is alert and oriented to person, place, and time.  Psychiatric:        Behavior: Behavior normal.   RECENT LABS AND TESTS: BMET    Component Value Date/Time   NA 140 04/22/2018 0915   K 4.4 04/22/2018 0915   CL 105 04/22/2018 0915   CO2 22 04/22/2018 0915   GLUCOSE 84 04/22/2018 0915   GLUCOSE 98 10/15/2017 1635   BUN 11 04/22/2018 0915   CREATININE 1.00 04/22/2018 0915   CALCIUM  9.4 04/22/2018 0915   GFRNONAA 77 04/22/2018 0915   GFRAA 89 04/22/2018 0915   Lab Results  Component Value Date   HGBA1C 5.3 04/22/2018   HGBA1C 5.5 12/29/2017   HGBA1C 5.8 08/07/2017   Lab Results  Component Value Date   INSULIN 11.9 04/22/2018   INSULIN 21.8 12/29/2017   CBC    Component Value Date/Time   WBC 5.0 04/22/2018 0915   WBC 5.7 08/07/2017 0938   RBC 4.70 04/22/2018 0915   RBC 4.90 08/07/2017 0938   HGB 11.9 04/22/2018 0915   HCT 37.9 04/22/2018 0915   PLT 354.0 08/07/2017 0938   MCV 81 04/22/2018 0915   MCH 25.3 (L) 04/22/2018 0915   MCH 24.5 (L) 07/06/2016 0536   MCHC 31.4 (L) 04/22/2018 0915   MCHC 32.7 08/07/2017 0938   RDW 13.8 04/22/2018 0915   LYMPHSABS 2.0 04/22/2018 0915   EOSABS 0.2 04/22/2018 0915   BASOSABS 0.1 04/22/2018 0915   Iron/TIBC/Ferritin/ %Sat    Component Value Date/Time   IRON 36 04/22/2018 0915   TIBC 340 04/22/2018 0915   FERRITIN 15 04/22/2018 0915   IRONPCTSAT 11 (L) 04/22/2018 0915   Lipid Panel     Component Value Date/Time   CHOL 193 04/22/2018 0915   TRIG 42 04/22/2018  0915   HDL 43 04/22/2018 0915   CHOLHDL 6 08/07/2017 0938   VLDL 22.0 08/07/2017 0938   LDLCALC 142 (H) 04/22/2018 0915   Hepatic Function Panel     Component Value Date/Time   PROT 7.1 04/22/2018 0915   ALBUMIN 4.4 04/22/2018 0915   AST 11 04/22/2018 0915   ALT 9 04/22/2018 0915   ALKPHOS 123 (H) 04/22/2018 0915   BILITOT 0.5 04/22/2018 0915      Component Value Date/Time   TSH 0.792 12/29/2017 1051   TSH 1.16 08/07/2017 0938   TSH 1.69 08/06/2016 0855   Results for LORETTO, BELINSKY (MRN [id number]) as of 05/06/2018 17:08  Ref. Range 04/22/2018 09:15  Vitamin D, 25-Hydroxy Latest Ref Range: 30.0 - 100.0 ng/mL 57.2   OBESITY BEHAVIORAL INTERVENTION VISIT  Today's visit was #10  Starting weight: 263 lbs Starting date: 12/29/2017 Today's weight: 257 lbs Today's date: 05/07/2018 Total lbs lost to date: 6  ASK: We discussed the diagnosis of obesity with Marcene Brawn today and Perpetua agreed to give [place] permission to discuss obesity behavioral modification therapy today.  ASSESS: Samiha has the diagnosis of obesity and her BMI today is 37.95. Areona is in the action stage of change.   ADVISE: Camauri was educated on the multiple health risks of obesity as well as the benefit of weight loss to improve her health. She was advised of the need for long term treatment and the importance of lifestyle modifications to improve her current health and to decrease her risk of future health problems.  AGREE: Multiple dietary modification options and treatment options were discussed and  Nayra agreed to follow the recommendations documented in the above note.  ARRANGE: Sayla was educated on the importance of frequent visits to treat obesity as outlined per CMS and USPSTF guidelines and agreed to schedule her next follow up appointment today.  IMichaelene Song, am acting as Location manager for [employer], FNP-C.  I have reviewed the above documentation for accuracy and completeness,  and I agree with the above.  -  , FNP-C.

## 2018-05-07 ENCOUNTER — Encounter (INDEPENDENT_AMBULATORY_CARE_PROVIDER_SITE_OTHER): Payer: Self-pay | Admitting: Family Medicine

## 2018-05-21 ENCOUNTER — Encounter (INDEPENDENT_AMBULATORY_CARE_PROVIDER_SITE_OTHER): Payer: Self-pay | Admitting: Family Medicine

## 2018-05-21 ENCOUNTER — Ambulatory Visit (INDEPENDENT_AMBULATORY_CARE_PROVIDER_SITE_OTHER): Payer: BLUE CROSS/BLUE SHIELD | Admitting: Family Medicine

## 2018-05-21 VITALS — BP 116/72 | HR 92 | Temp 97.9°F | Ht 69.0 in | Wt 253.0 lb

## 2018-05-21 DIAGNOSIS — E559 Vitamin D deficiency, unspecified: Secondary | ICD-10-CM

## 2018-05-21 DIAGNOSIS — Z9189 Other specified personal risk factors, not elsewhere classified: Secondary | ICD-10-CM | POA: Diagnosis not present

## 2018-05-21 DIAGNOSIS — E8881 Metabolic syndrome: Secondary | ICD-10-CM | POA: Diagnosis not present

## 2018-05-21 DIAGNOSIS — F3341 Major depressive disorder, recurrent, in partial remission: Secondary | ICD-10-CM | POA: Diagnosis not present

## 2018-05-21 DIAGNOSIS — Z6837 Body mass index (BMI) 37.0-37.9, adult: Secondary | ICD-10-CM

## 2018-05-21 MED ORDER — METFORMIN HCL 500 MG PO TABS: 500.0000 mg | ORAL_TABLET | Freq: Every day | ORAL | 0 refills | Status: DC

## 2018-05-21 NOTE — Progress Notes (Signed)
Office: [phone]  /  Fax: [phone]   HPI:   Chief Complaint: OBESITY Shannon [name] is here to discuss her progress with her obesity treatment plan. She is on the Category 3 plan and is journaling 450-600 calories + 40 grams of protein. She states she is following her eating plan approximately 50% of the time. She states she is exercising on elliptical, treadmill, and weights 30 minutes 3 times per week. [name] has cut back on candy and is now eating 100 calorie pack candy. She has also cut back on crackers and Frappucinos. She reports she is journaling at dinner. Her weight is 253 lb (114.8 kg) today and has had a weight loss of 4 pounds over a period of 2 weeks since her last visit. She has lost 10 lbs since starting treatment with [place].  Insulin Resistance Shannon [name] has a diagnosis of insulin resistance based on her elevated fasting insulin level >5. Although [name]'s blood glucose readings are still under good control, insulin resistance puts her at greater risk of metabolic syndrome and diabetes. She is taking metformin currently and continues to work on diet and exercise to decrease risk of diabetes. She denies polyphagia. Lab Results  Component Value Date   HGBA1C 5.3 04/22/2018    At risk for diabetes Shannon [name] is at higher than averagerisk for developing diabetes due to her obesity. She currently denies polyuria or polydipsia.  Vitamin D deficiency Shannon [name] has a diagnosis of Vitamin D deficiency. She is currently taking OTC Vit D 2,000 IU and is at goal. Shannon [name] denies nausea, vomiting or muscle weakness.  ASSESSMENT AND PLAN:  Insulin resistance - Plan: metFORMIN (GLUCOPHAGE) 500 MG tablet  Vitamin D deficiency  At risk for diabetes mellitus  Class 2 severe obesity with serious comorbidity and body mass index (BMI) of 37.0 to 37.9 in adult, unspecified obesity type ([place])  PLAN:  Insulin Resistance [place] will continue to work on weight loss, exercise, and decreasing simple  carbohydrates in her diet to help decrease the risk of diabetes. We dicussed metformin including benefits and risks. She was informed that eating too many simple carbohydrates or too many calories at one sitting increases the likelihood of GI side effects. Shannon [name] is on metformin for now and a refill prescription was written today for 500 mg qam #30 with 0 refills.[name] agrees to follow-up with our clinic in 4 weeks. She will follow-up in 2 weeks with our registered dietitian.  Diabetes risk counseling Shannon [name] was given extended (15 minutes) diabetes prevention counseling today. She is 28 y.o. female and has risk factors for diabetes including obesity. We discussed intensive lifestyle modifications today with an emphasis on weight loss as well as increasing exercise and decreasing simple carbohydrates in her diet.  Vitamin D Deficiency Shannon [name] was informed that low Vitamin D levels contributes to fatigue and are associated with obesity, breast, and colon cancer. She agrees to continue to take OTC Vit D @ 2,000 IU and will follow-up for routine testing of Vitamin D, at least 2-3 times per year. She was informed of the risk of over-replacement of Vitamin D and agrees to not increase her dose unless she discusses this with [place] first. [name] agrees to follow-up with our clinic in 4 weeks and will be seen in 2 weeks by our registered dietitian.  Obesity Shannon [name] is currently in the action stage of change. As such, her goal is to continue with weight loss efforts. She has agreed to follow the Category 3 plan and journaling 450-600  calories + 40 grams of protein. Shannon [name] has been instructed to continue her exercise regimen as above. We discussed the following Behavioral Modification Strategies today: decreasing simple carbohydrates, decrease liquid calories, and planning for success.  Shannon [name] has agreed to follow-up with our clinic in 4 weeks and will see our registered dietitian in 2 weeks. She was informed of  the importance of frequent follow up visits to maximize her success with intensive lifestyle modifications for her multiple health conditions.  ALLERGIES: Allergies  Allergen Reactions  . Bupropion Other (See Comments)    Reaction:  Constipation, states just the extended release    MEDICATIONS: Current Outpatient Medications on File Prior to Visit  Medication Sig Dispense Refill  . buPROPion (WELLBUTRIN) 100 MG tablet Take 100 mg by mouth 2 (two) times daily.    Shannon [name] losartan (COZAAR) 25 MG tablet Take 1 tablet (25 mg total) by mouth daily. 90 tablet 2  . norethindrone (ORTHO MICRONOR) 0.35 MG tablet Take 1 tablet (0.35 mg total) by mouth daily. 3 Package 4  . Vitamin D, Ergocalciferol, 50 MCG (2000 UT) CAPS Take 2,000 Units by mouth daily. 30 capsule 0   No current facility-administered medications on file prior to visit.     PAST MEDICAL HISTORY: Past Medical History:  Diagnosis Date  . Anemia   . Anxiety   . Constipation   . Depression   . Dyspnea   . HTN (hypertension)   . Knee pain   . Leg edema   . Medical history non-contributory   . Obesity   . Pain in both feet   . Prolonged QT interval   . Pulmonary thromboembolism ([place])   . Suicide ideation     PAST SURGICAL HISTORY: Past Surgical History:  Procedure Laterality Date  . NO PAST SURGERIES    . TEETH REMOVAL  07/2017    SOCIAL HISTORY: Social History   Tobacco Use  . Smoking status: Never Smoker  . Smokeless tobacco: Never Used  Substance Use Topics  . Alcohol use: No  . Drug use: No    FAMILY HISTORY: Family History  Problem Relation Age of Onset  . Healthy Mother   . Hypertension Mother   . Obesity Mother   . Healthy Father   . Pulmonary embolism Neg Hx   . Deep vein thrombosis Neg Hx    ROS: Review of Systems  Constitutional: Positive for weight loss.  Gastrointestinal: Negative for nausea and vomiting.  Musculoskeletal:       Negative for muscle weakness.  Endo/Heme/Allergies:        Negative for polyphagia. Negative for hypoglycemia.   PHYSICAL EXAM: Pulse 92, temperature 97.9 F (36.6 C), height 5\' 9"  (1.753 m), weight 253 lb (114.8 kg), last menstrual period 05/14/2018, SpO2 99 %. Body mass index is 37.36 kg/m. Physical Exam Vitals signs reviewed.  Constitutional:      Appearance: Normal appearance. She is obese.  Cardiovascular:     Rate and Rhythm: Normal rate.     Pulses: Normal pulses.  Pulmonary:     Effort: Pulmonary effort is normal.     Breath sounds: Normal breath sounds.  Musculoskeletal: Normal range of motion.  Skin:    General: Skin is warm and dry.  Neurological:     Mental Status: She is alert and oriented to person, place, and time.  Psychiatric:        Behavior: Behavior normal.   RECENT LABS AND TESTS: BMET    Component Value Date/Time   NA  140 04/22/2018 0915   K 4.4 04/22/2018 0915   CL 105 04/22/2018 0915   CO2 22 04/22/2018 0915   GLUCOSE 84 04/22/2018 0915   GLUCOSE 98 10/15/2017 1635   BUN 11 04/22/2018 0915   CREATININE 1.00 04/22/2018 0915   CALCIUM 9.4 04/22/2018 0915   GFRNONAA 77 04/22/2018 0915   GFRAA 89 04/22/2018 0915   Lab Results  Component Value Date   HGBA1C 5.3 04/22/2018   HGBA1C 5.5 12/29/2017   HGBA1C 5.8 08/07/2017   Lab Results  Component Value Date   INSULIN 11.9 04/22/2018   INSULIN 21.8 12/29/2017   CBC    Component Value Date/Time   WBC 5.0 04/22/2018 0915   WBC 5.7 08/07/2017 0938   RBC 4.70 04/22/2018 0915   RBC 4.90 08/07/2017 0938   HGB 11.9 04/22/2018 0915   HCT 37.9 04/22/2018 0915   PLT 354.0 08/07/2017 0938   MCV 81 04/22/2018 0915   MCH 25.3 (L) 04/22/2018 0915   MCH 24.5 (L) 07/06/2016 0536   MCHC 31.4 (L) 04/22/2018 0915   MCHC 32.7 08/07/2017 0938   RDW 13.8 04/22/2018 0915   LYMPHSABS 2.0 04/22/2018 0915   EOSABS 0.2 04/22/2018 0915   BASOSABS 0.1 04/22/2018 0915   Iron/TIBC/Ferritin/ %Sat    Component Value Date/Time   IRON 36 04/22/2018 0915   TIBC 340  04/22/2018 0915   FERRITIN 15 04/22/2018 0915   IRONPCTSAT 11 (L) 04/22/2018 0915   Lipid Panel     Component Value Date/Time   CHOL 193 04/22/2018 0915   TRIG 42 04/22/2018 0915   HDL 43 04/22/2018 0915   CHOLHDL 6 08/07/2017 0938   VLDL 22.0 08/07/2017 0938   LDLCALC 142 (H) 04/22/2018 0915   Hepatic Function Panel     Component Value Date/Time   PROT 7.1 04/22/2018 0915   ALBUMIN 4.4 04/22/2018 0915   AST 11 04/22/2018 0915   ALT 9 04/22/2018 0915   ALKPHOS 123 (H) 04/22/2018 0915   BILITOT 0.5 04/22/2018 0915      Component Value Date/Time   TSH 0.792 12/29/2017 1051   TSH 1.16 08/07/2017 0938   TSH 1.69 08/06/2016 0855    Ref. Range 04/22/2018 09:15  Vitamin D, 25-Hydroxy Latest Ref Range: 30.0 - 100.0 ng/mL 57.2   OBESITY BEHAVIORAL INTERVENTION VISIT  Today's visit was #10  Starting weight: 263 lbs Starting date: 12/29/2017 Today's weight: 253 lbs  Today's date: 05/25/2018 Total lbs lost to date: 10    05/21/2018  Height 5\' 9"  (1.753 m)  Weight 253 lb (114.8 kg)  BMI (Calculated) 37.34   Body Fat % 44.2 %  Total Body Water (lbs) 88.8 lbs   ASK: We discussed the diagnosis of obesity with Marcene Brawn today and Daijha agreed to give [place] permission to discuss obesity behavioral modification therapy today.  ASSESS: Shiri has the diagnosis of obesity and her BMI today is 37.34. Constance is in the action stage of change.   ADVISE: Niaomi was educated on the multiple health risks of obesity as well as the benefit of weight loss to improve her health. She was advised of the need for long term treatment and the importance of lifestyle modifications to improve her current health and to decrease her risk of future health problems.  AGREE: Multiple dietary modification options and treatment options were discussed and  Staysha agreed to follow the recommendations documented in the above note.  ARRANGE: Dalyce was educated on the importance of frequent visits to  treat obesity  as outlined per CMS and USPSTF guidelines and agreed to schedule her next follow up appointment today.  IMichaelene Song, am acting as Location manager for [employer], FNP-C.  I have reviewed the above documentation for accuracy and completeness, and I agree with the above.  - Tashica Provencio, FNP-C.

## 2018-05-25 ENCOUNTER — Encounter (INDEPENDENT_AMBULATORY_CARE_PROVIDER_SITE_OTHER): Payer: Self-pay | Admitting: Family Medicine

## 2018-05-27 DIAGNOSIS — F3341 Major depressive disorder, recurrent, in partial remission: Secondary | ICD-10-CM | POA: Diagnosis not present

## 2018-06-03 DIAGNOSIS — F3341 Major depressive disorder, recurrent, in partial remission: Secondary | ICD-10-CM | POA: Diagnosis not present

## 2018-06-03 DIAGNOSIS — F332 Major depressive disorder, recurrent severe without psychotic features: Secondary | ICD-10-CM | POA: Diagnosis not present

## 2018-06-08 ENCOUNTER — Other Ambulatory Visit: Payer: Self-pay

## 2018-06-08 ENCOUNTER — Ambulatory Visit (INDEPENDENT_AMBULATORY_CARE_PROVIDER_SITE_OTHER): Payer: BLUE CROSS/BLUE SHIELD | Admitting: Dietician

## 2018-06-08 VITALS — Ht 69.0 in | Wt 254.0 lb

## 2018-06-08 DIAGNOSIS — R7303 Prediabetes: Secondary | ICD-10-CM

## 2018-06-08 DIAGNOSIS — Z9189 Other specified personal risk factors, not elsewhere classified: Secondary | ICD-10-CM

## 2018-06-08 DIAGNOSIS — Z6837 Body mass index (BMI) 37.0-37.9, adult: Secondary | ICD-10-CM

## 2018-06-09 NOTE — Progress Notes (Signed)
  Office: [phone]  /  Fax: [phone]     Vincenta has a diagnosis of prediabetes based on her elevated HgA1c and was informed this puts her at greater risk of developing diabetes. She is here today for diabetes risk nutrition education.   Amber's weight today is 254 lbs, a 1 lb weight gain since her last visit. She has had a 9 lb weight loss since beginning treatment with [place].   She is on category 3 meal plan for breakfast and lunch and was given journaling for dinner 450-600 calories and 40+ g protein. She admits she is not journaling dinner and has deviated significantly from her meal plan. She reports skipping lunch or dinner most days, eating out more and "craving sugar." We discussed in detail the importance of food nutrients ie lean proteins, fats and carbohydrates and their affect on insulin response, hunger and her weight. She stated because of her job her dinner meal needs to be  smaller and she states she is likely not going to journal. We discussed at length moving the food/meals on the category 3 meal plan in a way to better fit her schedule focusing on meal planning for a busy schedule. She also reports her intake of water/fluids has been poor recently. Importance of staying adequately hydrated also reviewed. She agreed to continue the category 3 meal plan.    Druscilla is on the following meal plan: category 3 Her meal plan was individualized for maximum benefit.  Also discussed at length the following behavioral modifications to help maximize success: increasing lean protein intake, decreasing simple carbohydrates, increasing vegetables, increase water intake, decreasing eating out, avoiding skipping meals, meal planning and cooking strategies.    Donnisha has been instructed to work up to a goal of 150 minutes of combined cardio and strengthening exercise per week for weight loss and overall health benefits.     OBESITY BEHAVIORAL INTERVENTION VISIT  Today's visit was # 11    Starting weight: 263 lbs Starting date: 12/29/2017 Today's weight : Weight: 254 lb (115.2 kg)  Today's date:06/08/2018  Total lbs lost to date: 9 lbs. At least 15 minutes were spent on discussing the following behavioral intervention visit.   ASK: We discussed the diagnosis of obesity with Marcene Brawn today and Maryhelen agreed to give [place] permission to discuss obesity behavioral modification therapy today.  ASSESS: Natina has the diagnosis of obesity and her BMI today is 22 Ivanka is in the action stage of change   ADVISE: Saniyyah was educated on the multiple health risks of obesity as well as the benefit of weight loss to improve her health. She was advised of the need for long term treatment and the importance of lifestyle modifications to improve her current health and to decrease her risk of future health problems.  AGREE: Multiple dietary modification options and treatment options were discussed and  Athalene agreed to follow the recommendations documented in the above note.  ARRANGE: Joselynne was educated on the importance of frequent visits to treat obesity as outlined per CMS and USPSTF guidelines and agreed to schedule her next follow up appointment today.

## 2018-06-09 NOTE — Addendum Note (Signed)
Addended by: Zenaida Deed on: 06/09/2018 01:45 PM   Modules accepted: Level of Service

## 2018-06-10 DIAGNOSIS — F3341 Major depressive disorder, recurrent, in partial remission: Secondary | ICD-10-CM | POA: Diagnosis not present

## 2018-06-16 ENCOUNTER — Encounter (INDEPENDENT_AMBULATORY_CARE_PROVIDER_SITE_OTHER): Payer: Self-pay

## 2018-06-17 ENCOUNTER — Encounter (INDEPENDENT_AMBULATORY_CARE_PROVIDER_SITE_OTHER): Payer: Self-pay

## 2018-06-17 DIAGNOSIS — F3341 Major depressive disorder, recurrent, in partial remission: Secondary | ICD-10-CM | POA: Diagnosis not present

## 2018-06-18 ENCOUNTER — Encounter (INDEPENDENT_AMBULATORY_CARE_PROVIDER_SITE_OTHER): Payer: Self-pay | Admitting: Family Medicine

## 2018-06-18 ENCOUNTER — Ambulatory Visit (INDEPENDENT_AMBULATORY_CARE_PROVIDER_SITE_OTHER): Payer: BLUE CROSS/BLUE SHIELD | Admitting: Family Medicine

## 2018-06-18 ENCOUNTER — Other Ambulatory Visit: Payer: Self-pay

## 2018-06-18 DIAGNOSIS — E559 Vitamin D deficiency, unspecified: Secondary | ICD-10-CM | POA: Diagnosis not present

## 2018-06-18 DIAGNOSIS — Z6837 Body mass index (BMI) 37.0-37.9, adult: Secondary | ICD-10-CM

## 2018-06-18 DIAGNOSIS — E8881 Metabolic syndrome: Secondary | ICD-10-CM | POA: Diagnosis not present

## 2018-06-18 MED ORDER — METFORMIN HCL 500 MG PO TABS: 500.0000 mg | ORAL_TABLET | Freq: Every day | ORAL | 0 refills | Status: DC

## 2018-06-24 ENCOUNTER — Encounter (INDEPENDENT_AMBULATORY_CARE_PROVIDER_SITE_OTHER): Payer: Self-pay | Admitting: Family Medicine

## 2018-06-24 DIAGNOSIS — F3341 Major depressive disorder, recurrent, in partial remission: Secondary | ICD-10-CM | POA: Diagnosis not present

## 2018-06-24 NOTE — Progress Notes (Addendum)
Office: [phone]  /  Fax: [phone] TeleHealth Visit:  Shannon [name] has consented to this TeleHealth visit today via telephone call. The patient is located at home, the provider is located at the [facility] office. The participants in this visit include the listed provider and patient.  HPI:   Chief Complaint: OBESITY Shannon [name] is here to discuss her progress with her obesity treatment plan. She is on the keep a food journal with 450-600 calories and 40 grams of protein  and follow the Category 3 plan and is following her eating plan approximately 25 % of the time. She states she is walking for 30 minutes 3 times per week. Shannon [name] feels she has gained weight. She has been unable to find meat, eggs, and milk. She has been eating out and making healthy choices. She does not have a scale at home. She notes increased stress and has increased her candy intake. She eats gummies.  We were unable to weight the patient today for this TeleHealth visit. She feels as if she has gained 3 lbs since her last visit. She has lost 10 lbs since starting treatment with [place].  Insulin Resistance Shannon [name] has a diagnosis of insulin resistance based on her elevated fasting insulin level >5. Although Shannon [name]'s blood glucose readings are still under good control, insulin resistance puts her at greater risk of metabolic syndrome and diabetes. She is taking metformin currently and denies polyphagia. She continues to work on diet and exercise to decrease risk of diabetes. Lab Results  Component Value Date   HGBA1C 5.3 04/22/2018   Vitamin D Deficiency Shannon [name] has a diagnosis of vitamin D deficiency. She is currently taking OTC Vit D 2,000 IU daily. She denies nausea, vomiting or muscle weakness.  ASSESSMENT AND PLAN:  Vitamin D deficiency  Insulin resistance - Plan: metFORMIN (GLUCOPHAGE) 500 MG tablet  Class 2 severe obesity with serious comorbidity and body mass index (BMI) of 37.0 to 37.9 in adult,  unspecified obesity type ([place])  PLAN:  Insulin Resistance Shannon [name] will continue to work on weight loss, exercise, and decr Shannon [name] agrees to continue metformin 500 mg q AM with breakfast #30 and we will refill for 1 month. Shannon [name] agrees to follow up with our clinic in 2 to 3 weeks as directed to monitor her progress.  Vitamin D Deficiency Shannon [name] was informed that low vitamin D levels contributes to fatigue and are associated with obesity, breast, and colon cancer. Shannon [name] agrees to continue taking OTC Vit D and will follow up for routine testing of vitamin D, at least 2-3 times per year. She was informed of the risk of over-replacement of vitamin D and agrees to not increase her dose unless she discusses this with [place] first. Shannon [name] agrees to follow up with our clinic in 2 to 3 weeks.  Obesity Shannon [name] is currently in the action stage of change. As such, her goal is to continue with weight loss efforts She has agreed to keep a food journal with 1300-1400 calories and 85-90 grams of protein daily or follow the Category 3 plan Shannon [name] will continue current exercise regimen for weight loss and overall health benefits. We discussed the following Behavioral Modification Strategies today: decreasing simple carbohydrates, emotional eating strategies, keeping healthy foods in the home, and keep a strict food journal Shannon [name] may journal for a complete day rather than just dinner. She is to journal even the candy she eats. She is weigh at the drug store if possible  Shannon [name] has agreed  to follow up with our clinic in 2 to 3 weeks. She was informed of the importance of frequent follow up visits to maximize her success with intensive lifestyle modifications for her multiple health conditions.  ALLERGIES: Allergies  Allergen Reactions  . Bupropion Other (See Comments)    Reaction:  Constipation, states just the extended release    MEDICATIONS: Current Outpatient Medications on File Prior to Visit  Medication  Sig Dispense Refill  . buPROPion (WELLBUTRIN) 100 MG tablet Take 100 mg by mouth 2 (two) times daily.    [name] losartan (COZAAR) 25 MG tablet Take 1 tablet (25 mg total) by mouth daily. 90 tablet 2  . norethindrone (ORTHO MICRONOR) 0.35 MG tablet Take 1 tablet (0.35 mg total) by mouth daily. 3 Package 4  . Vitamin D, Ergocalciferol, 50 MCG (2000 UT) CAPS Take 2,000 Units by mouth daily. 30 capsule 0   No current facility-administered medications on file prior to visit.     PAST MEDICAL HISTORY: Past Medical History:  Diagnosis Date  . Anemia   . Anxiety   . Constipation   . Depression   . Dyspnea   . HTN (hypertension)   . Knee pain   . Leg edema   . Medical history non-contributory   . Obesity   . Pain in both feet   . Prolonged QT interval   . Pulmonary thromboembolism ([place])   . Suicide ideation     PAST SURGICAL HISTORY: Past Surgical History:  Procedure Laterality Date  . NO PAST SURGERIES    . TEETH REMOVAL  07/2017    SOCIAL HISTORY: Social History   Tobacco Use  . Smoking status: Never Smoker  . Smokeless tobacco: Never Used  Substance Use Topics  . Alcohol use: No  . Drug use: No    FAMILY HISTORY: Family History  Problem Relation Age of Onset  . Healthy Mother   . Hypertension Mother   . Obesity Mother   . Healthy Father   . Pulmonary embolism Neg Hx   . Deep vein thrombosis Neg Hx     ROS: Review of Systems  Constitutional: Negative for weight loss.  Gastrointestinal: Negative for nausea and vomiting.  Musculoskeletal:       Negative muscle weakness  Endo/Heme/Allergies:       Negative polyphagia    PHYSICAL EXAM: Pt in no acute distress  RECENT LABS AND TESTS: BMET    Component Value Date/Time   NA 140 04/22/2018 0915   K 4.4 04/22/2018 0915   CL 105 04/22/2018 0915   CO2 22 04/22/2018 0915   GLUCOSE 84 04/22/2018 0915   GLUCOSE 98 10/15/2017 1635   BUN 11 04/22/2018 0915   CREATININE 1.00 04/22/2018 0915   CALCIUM 9.4  04/22/2018 0915   GFRNONAA 77 04/22/2018 0915   GFRAA 89 04/22/2018 0915   Lab Results  Component Value Date   HGBA1C 5.3 04/22/2018   HGBA1C 5.5 12/29/2017   HGBA1C 5.8 08/07/2017   Lab Results  Component Value Date   INSULIN 11.9 04/22/2018   INSULIN 21.8 12/29/2017   CBC    Component Value Date/Time   WBC 5.0 04/22/2018 0915   WBC 5.7 08/07/2017 0938   RBC 4.70 04/22/2018 0915   RBC 4.90 08/07/2017 0938   HGB 11.9 04/22/2018 0915   HCT 37.9 04/22/2018 0915   PLT 354.0 08/07/2017 0938   MCV 81 04/22/2018 0915   MCH 25.3 (L) 04/22/2018 0915   MCH 24.5 (L) 07/06/2016 0737  MCHC 31.4 (L) 04/22/2018 0915   MCHC 32.7 08/07/2017 0938   RDW 13.8 04/22/2018 0915   LYMPHSABS 2.0 04/22/2018 0915   EOSABS 0.2 04/22/2018 0915   BASOSABS 0.1 04/22/2018 0915   Iron/TIBC/Ferritin/ %Sat    Component Value Date/Time   IRON 36 04/22/2018 0915   TIBC 340 04/22/2018 0915   FERRITIN 15 04/22/2018 0915   IRONPCTSAT 11 (L) 04/22/2018 0915   Lipid Panel     Component Value Date/Time   CHOL 193 04/22/2018 0915   TRIG 42 04/22/2018 0915   HDL 43 04/22/2018 0915   CHOLHDL 6 08/07/2017 0938   VLDL 22.0 08/07/2017 0938   LDLCALC 142 (H) 04/22/2018 0915   Hepatic Function Panel     Component Value Date/Time   PROT 7.1 04/22/2018 0915   ALBUMIN 4.4 04/22/2018 0915   AST 11 04/22/2018 0915   ALT 9 04/22/2018 0915   ALKPHOS 123 (H) 04/22/2018 0915   BILITOT 0.5 04/22/2018 0915      Component Value Date/Time   TSH 0.792 12/29/2017 1051   TSH 1.16 08/07/2017 0938   TSH 1.69 08/06/2016 0855      I, Trixie Dredge, am acting as Location manager for [employer], FNP-C.  I have reviewed the above documentation for accuracy and completeness, and I agree with the above.  - Abbygayle Helfand, FNP-C.

## 2018-07-01 DIAGNOSIS — F3341 Major depressive disorder, recurrent, in partial remission: Secondary | ICD-10-CM | POA: Diagnosis not present

## 2018-07-07 ENCOUNTER — Ambulatory Visit (INDEPENDENT_AMBULATORY_CARE_PROVIDER_SITE_OTHER): Payer: BLUE CROSS/BLUE SHIELD | Admitting: Family Medicine

## 2018-07-07 ENCOUNTER — Encounter (INDEPENDENT_AMBULATORY_CARE_PROVIDER_SITE_OTHER): Payer: Self-pay | Admitting: Family Medicine

## 2018-07-07 ENCOUNTER — Other Ambulatory Visit: Payer: Self-pay

## 2018-07-07 DIAGNOSIS — I10 Essential (primary) hypertension: Secondary | ICD-10-CM | POA: Diagnosis not present

## 2018-07-07 DIAGNOSIS — E8881 Metabolic syndrome: Secondary | ICD-10-CM | POA: Diagnosis not present

## 2018-07-07 DIAGNOSIS — Z6837 Body mass index (BMI) 37.0-37.9, adult: Secondary | ICD-10-CM

## 2018-07-07 MED ORDER — METFORMIN HCL 500 MG PO TABS: 500.0000 mg | ORAL_TABLET | Freq: Every day | ORAL | 0 refills | Status: DC

## 2018-07-08 ENCOUNTER — Encounter (INDEPENDENT_AMBULATORY_CARE_PROVIDER_SITE_OTHER): Payer: Self-pay | Admitting: Family Medicine

## 2018-07-08 DIAGNOSIS — F3341 Major depressive disorder, recurrent, in partial remission: Secondary | ICD-10-CM | POA: Diagnosis not present

## 2018-07-08 NOTE — Progress Notes (Signed)
Office: [phone]  /  Fax: [phone] TeleHealth Visit:  Shannon [name] has verbally consented to this TeleHealth visit today. The patient is located at home, the provider is located at the [employer] [facility] office. The participants in this visit include the listed provider and patient. The visit was conducted today via Webex.  HPI:   Chief Complaint: OBESITY Shannon [name] is here to discuss her progress with her obesity treatment plan. She is on the Category 3 plan or journaling 1300-1400 calories + 85-90 grams of protein daily and is following her eating plan approximately 25% of the time. She states she is walking 30 minutes 7 times per week. Shannon [name] states she is still eating candy regularly and also skipping lunch and dinner at times. She reports snacking after breakfast and is not meeting her protein goal. She does not have a scale at home. We were unable to weigh the patient today for this TeleHealth visit. She feels as if she has maintained her weight since her last visit. She has lost 10 lbs since starting treatment with [place].  Insulin Resistance Shannon [name] has a diagnosis of insulin resistance based on her elevated fasting insulin level >5. Although Shannon [name]'s blood glucose readings are still under good control, insulin resistance puts her at greater risk of metabolic syndrome and diabetes. She is taking metformin currently and continues to work on diet and exercise to decrease risk of diabetes. Shannon [name] reports increased eating from boredom. No polyphagia. Lab Results  Component Value Date   HGBA1C 5.3 04/22/2018    Hypertension [name] is a 28 y.o. female with hypertension, which is moderately well controlled on losartan.  [name] denies chest pain or shortness of breath on exertion. She is working weight loss to help control her blood pressure with the goal of decreasing her risk of heart attack and stroke. [name] reports her blood pressure at home today is 148/91.    ASSESSMENT AND PLAN:  Insulin resistance - Plan: metFORMIN (GLUCOPHAGE) 500 MG tablet  Essential hypertension  Class 2 severe obesity with serious comorbidity and body mass index (BMI) of 37.0 to 37.9 in adult, unspecified obesity type ([place])  PLAN:  Insulin Resistance Shannon [name] will continue to work on weight loss, exercise, and decreasing simple carbohydrates in her diet to help decrease the risk of diabetes. We dicussed metformin including benefits and risks. She was informed that eating too many simple carbohydrates or too many calories at one sitting increases the likelihood of GI side effects. Shannon [name] is currently taking metformin and a refill prescription was written today for 500 mg QAM #30 with 0 refills. Shannon [name] agrees to follow-up with our clinic in 2 weeks.  Hypertension We discussed sodium restriction, working on healthy weight loss, and a regular exercise program as the means to achieve improved blood pressure control. Shannon [name] agreed with this plan and agreed to follow up as directed. We will continue to monitor her blood pressure as well as her progress with the above lifestyle modifications. She will continue her losartan as prescribed, continue to monitor her blood pressure, and will watch for signs of hypotension as she continues her lifestyle modifications.  Obesity Shannon [name] is currently in the action stage of change. As such, her goal is to continue with weight loss efforts. She has agreed to keep a food journal with 1300-1400 calories and 85-90 grams of protein daily. She has been advised to decrease her candy intake. Shannon [name] has been instructed to continue her current exercise regimen for weight loss and  overall health benefits. We discussed the following Behavioral Modification Strategies today: increasing lean protein intake, decreasing simple carbohydrates, no skipping meals, and keep a strict food journal.  Shannon [name] has agreed to follow-up with our clinic in 2 weeks. She was  informed of the importance of frequent follow-up visits to maximize her success with intensive lifestyle modifications for her multiple health conditions.  ALLERGIES: Allergies  Allergen Reactions  . Bupropion Other (See Comments)    Reaction:  Constipation, states just the extended release    MEDICATIONS: Current Outpatient Medications on File Prior to Visit  Medication Sig Dispense Refill  . buPROPion (WELLBUTRIN) 100 MG tablet Take 100 mg by mouth 2 (two) times daily.    [name] losartan (COZAAR) 25 MG tablet Take 1 tablet (25 mg total) by mouth daily. 90 tablet 2  . norethindrone (ORTHO MICRONOR) 0.35 MG tablet Take 1 tablet (0.35 mg total) by mouth daily. 3 Package 4  . Vitamin D, Ergocalciferol, 50 MCG (2000 UT) CAPS Take 2,000 Units by mouth daily. 30 capsule 0   No current facility-administered medications on file prior to visit.     PAST MEDICAL HISTORY: Past Medical History:  Diagnosis Date  . Anemia   . Anxiety   . Constipation   . Depression   . Dyspnea   . HTN (hypertension)   . Knee pain   . Leg edema   . Medical history non-contributory   . Obesity   . Pain in both feet   . Prolonged QT interval   . Pulmonary thromboembolism ([place])   . Suicide ideation     PAST SURGICAL HISTORY: Past Surgical History:  Procedure Laterality Date  . NO PAST SURGERIES    . TEETH REMOVAL  07/2017    SOCIAL HISTORY: Social History   Tobacco Use  . Smoking status: Never Smoker  . Smokeless tobacco: Never Used  Substance Use Topics  . Alcohol use: No  . Drug use: No    FAMILY HISTORY: Family History  Problem Relation Age of Onset  . Healthy Mother   . Hypertension Mother   . Obesity Mother   . Healthy Father   . Pulmonary embolism Neg Hx   . Deep vein thrombosis Neg Hx    ROS: Review of Systems  Respiratory: Negative for shortness of breath.   Cardiovascular: Negative for chest pain.  Endo/Heme/Allergies:       Negative for polyphagia.   PHYSICAL EXAM: Pt in  no acute distress  RECENT LABS AND TESTS: BMET    Component Value Date/Time   NA 140 04/22/2018 0915   K 4.4 04/22/2018 0915   CL 105 04/22/2018 0915   CO2 22 04/22/2018 0915   GLUCOSE 84 04/22/2018 0915   GLUCOSE 98 10/15/2017 1635   BUN 11 04/22/2018 0915   CREATININE 1.00 04/22/2018 0915   CALCIUM 9.4 04/22/2018 0915   GFRNONAA 77 04/22/2018 0915   GFRAA 89 04/22/2018 0915   Lab Results  Component Value Date   HGBA1C 5.3 04/22/2018   HGBA1C 5.5 12/29/2017   HGBA1C 5.8 08/07/2017   Lab Results  Component Value Date   INSULIN 11.9 04/22/2018   INSULIN 21.8 12/29/2017   CBC    Component Value Date/Time   WBC 5.0 04/22/2018 0915   WBC 5.7 08/07/2017 0938   RBC 4.70 04/22/2018 0915   RBC 4.90 08/07/2017 0938   HGB 11.9 04/22/2018 0915   HCT 37.9 04/22/2018 0915   PLT 354.0 08/07/2017 0938   MCV 81 04/22/2018 0915  MCH 25.3 (L) 04/22/2018 0915   MCH 24.5 (L) 07/06/2016 0536   MCHC 31.4 (L) 04/22/2018 0915   MCHC 32.7 08/07/2017 0938   RDW 13.8 04/22/2018 0915   LYMPHSABS 2.0 04/22/2018 0915   EOSABS 0.2 04/22/2018 0915   BASOSABS 0.1 04/22/2018 0915   Iron/TIBC/Ferritin/ %Sat    Component Value Date/Time   IRON 36 04/22/2018 0915   TIBC 340 04/22/2018 0915   FERRITIN 15 04/22/2018 0915   IRONPCTSAT 11 (L) 04/22/2018 0915   Lipid Panel     Component Value Date/Time   CHOL 193 04/22/2018 0915   TRIG 42 04/22/2018 0915   HDL 43 04/22/2018 0915   CHOLHDL 6 08/07/2017 0938   VLDL 22.0 08/07/2017 0938   LDLCALC 142 (H) 04/22/2018 0915   Hepatic Function Panel     Component Value Date/Time   PROT 7.1 04/22/2018 0915   ALBUMIN 4.4 04/22/2018 0915   AST 11 04/22/2018 0915   ALT 9 04/22/2018 0915   ALKPHOS 123 (H) 04/22/2018 0915   BILITOT 0.5 04/22/2018 0915      Component Value Date/Time   TSH 0.792 12/29/2017 1051   TSH 1.16 08/07/2017 0938   TSH 1.69 08/06/2016 0855   Results for YSABELA, KEISLER (MRN [id number]) as of 07/08/2018 07:25  Ref.  Range 04/22/2018 09:15  Vitamin D, 25-Hydroxy Latest Ref Range: 30.0 - 100.0 ng/mL 57.2   I, Michaelene Song, am acting as Location manager for [employer], FNP-C.  I have reviewed the above documentation for accuracy and completeness, and I agree with the above.  - Nikiyah Fackler, FNP-C.

## 2018-07-15 DIAGNOSIS — F3341 Major depressive disorder, recurrent, in partial remission: Secondary | ICD-10-CM | POA: Diagnosis not present

## 2018-07-22 ENCOUNTER — Ambulatory Visit (INDEPENDENT_AMBULATORY_CARE_PROVIDER_SITE_OTHER): Payer: BLUE CROSS/BLUE SHIELD | Admitting: Family Medicine

## 2018-07-22 ENCOUNTER — Other Ambulatory Visit: Payer: Self-pay

## 2018-07-22 ENCOUNTER — Encounter (INDEPENDENT_AMBULATORY_CARE_PROVIDER_SITE_OTHER): Payer: Self-pay | Admitting: Family Medicine

## 2018-07-22 DIAGNOSIS — Z6837 Body mass index (BMI) 37.0-37.9, adult: Secondary | ICD-10-CM | POA: Diagnosis not present

## 2018-07-22 DIAGNOSIS — F3289 Other specified depressive episodes: Secondary | ICD-10-CM | POA: Diagnosis not present

## 2018-07-22 DIAGNOSIS — F3341 Major depressive disorder, recurrent, in partial remission: Secondary | ICD-10-CM | POA: Diagnosis not present

## 2018-07-23 ENCOUNTER — Encounter (INDEPENDENT_AMBULATORY_CARE_PROVIDER_SITE_OTHER): Payer: Self-pay | Admitting: Family Medicine

## 2018-07-23 NOTE — Progress Notes (Signed)
Office: [phone]  /  Fax: [phone] TeleHealth Visit:  [name] has verbally consented to this TeleHealth visit today. The patient is located at home, the provider is located at the [employer] [facility] office. The participants in this visit include the listed provider and patient. The visit was conducted today via FaceTime.  HPI:   Chief Complaint: OBESITY Shannon [name] is here to discuss her progress with her obesity treatment plan. She is keeping a food journal with 1300-1400 calories and 85-90 grams of protein daily and is following her eating plan approximately 50% of the time. She states she is exercising 0 minutes 0 times per week. [name] states her weight today is 265 lbs. She thinks she has gained 10 lbs. She is eating quite a bit of candy and states she is stress eating due to COVID-19 and impending college graduation and job search. She reports journaling inconsistently. We were unable to weigh the patient today for this TeleHealth visit. She states her weight was 265 on 07/22/2018. She has lost 10 lbs since starting treatment with [place].  Depression with emotional eating behaviors [name] is struggling with emotional eating and using food for comfort to the extent that it is negatively impacting her health. She often snacks when she is not hungry. [name] sometimes feels she is out of control and then feels guilty that she made poor food choices. She has been working on behavior modification techniques to help reduce her emotional eating and has not been  successful. She sees a Social worker weekly and discusses stress eating. She is currently on bupropion per Psychiatry. She shows no sign of suicidal or homicidal ideations.  Depression screen [facility] 2/9 01/12/2018 12/29/2017 08/07/2017 08/06/2016  Decreased Interest 1 1 2  0  Down, Depressed, Hopeless 1 3 0 0  PHQ - 2 Score 2 4 2  0  Altered sleeping 0 2 0 0  Tired, decreased energy 2 2 2 1   Change in appetite 2 3 2  0  Feeling bad or  failure about yourself  1 3 0 0  Trouble concentrating 2 3 0 0  Moving slowly or fidgety/restless 0 2 0 0  Suicidal thoughts 1 2 0 0  PHQ-9 Score 10 21 6 1   Difficult doing work/chores - Somewhat difficult Not difficult at all -   ASSESSMENT AND PLAN:  Other depression - with emotional eating  Class 2 severe obesity with serious comorbidity and body mass index (BMI) of 37.0 to 37.9 in adult, unspecified obesity type ([facility])  PLAN:  Depression with Emotional Eating Behaviors We discussed behavior modification techniques today to help [name] deal with her emotional eating and depression. She will continue strategies for decreased stress eating and continue to meet with her counselor.  I spent > than 50% of the 15 minute visit on counseling as documented in the note.  Obesity [name] is currently in the action stage of change. As such, her goal is to continue with weight loss efforts. She has agreed to keep a food journal with 1300-1400 calories and 85-90 grams of protein daily. She is to journal at least 5 days per week and to journal all food - even candy. She was advised to try not to buy candy. [name] has been instructed to work up to a goal of 150 minutes of combined cardio and strengthening exercise per week for weight loss and overall health benefits. We discussed the following Behavioral Modification Strategies today: keeping healthy foods in the home, better snacking choices, emotional eating strategies,  avoiding temptations, and planning for success.  [name] has agreed to follow-up with our clinic in 2 weeks. She was informed of the importance of frequent follow-up visits to maximize her success with intensive lifestyle modifications for her multiple health conditions.  ALLERGIES: Allergies  Allergen Reactions  . Bupropion Other (See Comments)    Reaction:  Constipation, states just the extended release    MEDICATIONS: Current Outpatient Medications on File Prior to Visit   Medication Sig Dispense Refill  . buPROPion (WELLBUTRIN) 100 MG tablet Take 100 mg by mouth 2 (two) times daily.    [name] losartan (COZAAR) 25 MG tablet Take 1 tablet (25 mg total) by mouth daily. 90 tablet 2  . metFORMIN (GLUCOPHAGE) 500 MG tablet Take 1 tablet (500 mg total) by mouth daily with breakfast. 30 tablet 0  . norethindrone (ORTHO MICRONOR) 0.35 MG tablet Take 1 tablet (0.35 mg total) by mouth daily. 3 Package 4  . Vitamin D, Ergocalciferol, 50 MCG (2000 UT) CAPS Take 2,000 Units by mouth daily. 30 capsule 0   No current facility-administered medications on file prior to visit.     PAST MEDICAL HISTORY: Past Medical History:  Diagnosis Date  . Anemia   . Anxiety   . Constipation   . Depression   . Dyspnea   . HTN (hypertension)   . Knee pain   . Leg edema   . Medical history non-contributory   . Obesity   . Pain in both feet   . Prolonged QT interval   . Pulmonary thromboembolism ([place])   . Suicide ideation     PAST SURGICAL HISTORY: Past Surgical History:  Procedure Laterality Date  . NO PAST SURGERIES    . TEETH REMOVAL  07/2017    SOCIAL HISTORY: Social History   Tobacco Use  . Smoking status: Never Smoker  . Smokeless tobacco: Never Used  Substance Use Topics  . Alcohol use: No  . Drug use: No    FAMILY HISTORY: Family History  Problem Relation Age of Onset  . Healthy Mother   . Hypertension Mother   . Obesity Mother   . Healthy Father   . Pulmonary embolism Neg Hx   . Deep vein thrombosis Neg Hx    ROS: Review of Systems  Psychiatric/Behavioral: Positive for depression (emotional eating). Negative for suicidal ideas.       Negative for homicidal ideas.   PHYSICAL EXAM: Pt in no acute distress  RECENT LABS AND TESTS: BMET    Component Value Date/Time   NA 140 04/22/2018 0915   K 4.4 04/22/2018 0915   CL 105 04/22/2018 0915   CO2 22 04/22/2018 0915   GLUCOSE 84 04/22/2018 0915   GLUCOSE 98 10/15/2017 1635   BUN 11 04/22/2018 0915    CREATININE 1.00 04/22/2018 0915   CALCIUM 9.4 04/22/2018 0915   GFRNONAA 77 04/22/2018 0915   GFRAA 89 04/22/2018 0915   Lab Results  Component Value Date   HGBA1C 5.3 04/22/2018   HGBA1C 5.5 12/29/2017   HGBA1C 5.8 08/07/2017   Lab Results  Component Value Date   INSULIN 11.9 04/22/2018   INSULIN 21.8 12/29/2017   CBC    Component Value Date/Time   WBC 5.0 04/22/2018 0915   WBC 5.7 08/07/2017 0938   RBC 4.70 04/22/2018 0915   RBC 4.90 08/07/2017 0938   HGB 11.9 04/22/2018 0915   HCT 37.9 04/22/2018 0915   PLT 354.0 08/07/2017 0938   MCV 81 04/22/2018 0915   MCH 25.3 (  L) 04/22/2018 0915   MCH 24.5 (L) 07/06/2016 0536   MCHC 31.4 (L) 04/22/2018 0915   MCHC 32.7 08/07/2017 0938   RDW 13.8 04/22/2018 0915   LYMPHSABS 2.0 04/22/2018 0915   EOSABS 0.2 04/22/2018 0915   BASOSABS 0.1 04/22/2018 0915   Iron/TIBC/Ferritin/ %Sat    Component Value Date/Time   IRON 36 04/22/2018 0915   TIBC 340 04/22/2018 0915   FERRITIN 15 04/22/2018 0915   IRONPCTSAT 11 (L) 04/22/2018 0915   Lipid Panel     Component Value Date/Time   CHOL 193 04/22/2018 0915   TRIG 42 04/22/2018 0915   HDL 43 04/22/2018 0915   CHOLHDL 6 08/07/2017 0938   VLDL 22.0 08/07/2017 0938   LDLCALC 142 (H) 04/22/2018 0915   Hepatic Function Panel     Component Value Date/Time   PROT 7.1 04/22/2018 0915   ALBUMIN 4.4 04/22/2018 0915   AST 11 04/22/2018 0915   ALT 9 04/22/2018 0915   ALKPHOS 123 (H) 04/22/2018 0915   BILITOT 0.5 04/22/2018 0915      Component Value Date/Time   TSH 0.792 12/29/2017 1051   TSH 1.16 08/07/2017 0938   TSH 1.69 08/06/2016 0855   Results for SHALEN, PETRAK (MRN [id number]) as of 07/23/2018 08:57  Ref. Range 04/22/2018 09:15  Vitamin D, 25-Hydroxy Latest Ref Range: 30.0 - 100.0 ng/mL 57.2   I, Michaelene Song, am acting as Location manager for [employer], FNP-C.  I have reviewed the above documentation for accuracy and completeness, and I agree with the above.  - Valerie Cones, FNP-C.

## 2018-07-29 DIAGNOSIS — F3341 Major depressive disorder, recurrent, in partial remission: Secondary | ICD-10-CM | POA: Diagnosis not present

## 2018-08-05 ENCOUNTER — Other Ambulatory Visit: Payer: Self-pay

## 2018-08-05 ENCOUNTER — Ambulatory Visit (INDEPENDENT_AMBULATORY_CARE_PROVIDER_SITE_OTHER): Payer: BLUE CROSS/BLUE SHIELD | Admitting: Family Medicine

## 2018-08-05 DIAGNOSIS — E8881 Metabolic syndrome: Secondary | ICD-10-CM

## 2018-08-05 DIAGNOSIS — Z6837 Body mass index (BMI) 37.0-37.9, adult: Secondary | ICD-10-CM | POA: Diagnosis not present

## 2018-08-05 DIAGNOSIS — F3289 Other specified depressive episodes: Secondary | ICD-10-CM

## 2018-08-05 DIAGNOSIS — F3341 Major depressive disorder, recurrent, in partial remission: Secondary | ICD-10-CM | POA: Diagnosis not present

## 2018-08-05 MED ORDER — METFORMIN HCL 500 MG PO TABS: 500.0000 mg | ORAL_TABLET | Freq: Two times a day (BID) | ORAL | 0 refills | Status: DC

## 2018-08-05 NOTE — Progress Notes (Signed)
Office: [phone]  /  Fax: [phone] TeleHealth Visit:  Shannon Shannon [name] has verbally consented to this TeleHealth visit today. The patient is located at home, the provider is located at the [facility] office. The participants in this visit include the listed provider and patient. The visit was conducted today via Webex.  HPI:   Chief Complaint: OBESITY Shannon Shannon [name] is here to discuss her progress with her obesity treatment plan. She is keeping Shannon food journal with 1300-1400 calories and 85-90 grams of protein daily and is following her eating plan approximately 50% of the time. She states she is walking 20 minutes 4 times per week. Shannon Shannon [name] states her weight is 265 lbs today, reflecting weight maintenance. She reports not eating quite as much candy and is substituting fruit and almonds. She also reports meeting her protein goal daily. She started back to work within the last few weeks at [employer] and sometimes does not get meal breaks. We were unable to weigh the patient today for this TeleHealth visit. She states her weight today is 265 lbs. She has lost 10 lbs since starting treatment with [place].  Insulin Resistance Shannon Shannon [name] has Shannon diagnosis of insulin resistance based on her elevated fasting insulin level >5. Although Shannon Shannon [name]'s blood glucose readings are still under good control, insulin resistance puts her at greater risk of metabolic syndrome and diabetes. She is taking metformin currently and continues to work on diet and exercise to decrease risk of diabetes. No polyphagia. She does report cravings for candy. Lab Results  Component Value Date   HGBA1C 5.3 04/22/2018    Depression with emotional eating behaviors Shannon Shannon [name] is struggling with emotional eating and using food for comfort to the extent that it is negatively impacting her health. Shannon Shannon [name] has been discussing stress eating of candy with the counselor. The counselor suggested substituting fruit and almonds and this is working well  so far. She shows no sign of suicidal or homicidal ideations.  Depression screen Shannon Shannon [name] 2/9 01/12/2018 12/29/2017 08/07/2017 08/06/2016  Decreased Interest 1 1 2  0  Down, Depressed, Hopeless 1 3 0 0  PHQ - 2 Score 2 4 2  0  Altered sleeping 0 2 0 0  Tired, decreased energy 2 2 2 1   Change in appetite 2 3 2  0  Feeling bad or failure about yourself  1 3 0 0  Trouble concentrating 2 3 0 0  Moving slowly or fidgety/restless 0 2 0 0  Suicidal thoughts 1 2 0 0  PHQ-9 Score 10 21 6 1   Difficult doing work/chores - Somewhat difficult Not difficult at all -   ASSESSMENT AND PLAN:  Insulin resistance - Plan: metFORMIN (GLUCOPHAGE) 500 MG tablet  Other depression - with emotional eating  Class 2 severe obesity with serious comorbidity and body mass index (BMI) of 37.0 to 37.9 in adult, unspecified obesity type (HCC)  PLAN:  Insulin Resistance Shannon Shannon [name] will continue to work on weight loss, exercise, and decreasing simple carbohydrates in her diet to help decrease the risk of diabetes. We dicussed metformin including benefits and risks. She was informed that eating too many simple carbohydrates or too many calories at one sitting increases the likelihood of GI side effects. Shannon Shannon [name] will increase her metformin dose to 500 mg BID with meals #60 with 0 refills and agrees to follow-up with our clinic in 2 weeks.  Depression with Emotional Eating Behaviors We discussed behavior modification techniques today to help Shannon Shannon [name] deal with her emotional eating and depression. Shannon [name] will continue substituting  fruit and almonds for candy but journal consistently what she eats.  Obesity Shannon Shannon [name] is currently in the action stage of change. As such, her goal is to continue with weight loss efforts. She has agreed to keep Shannon food journal with 1300-1400 calories and 85-90 grams of protein. [name] has been instructed to continue her current exercise regimen for weight loss and overall health benefits. We discussed the  following Behavioral Modification Strategies today: no skipping meals, better snacking choices, emotional eating strategies, avoiding temptations, and planning for success.  [name] has agreed to follow-up with our clinic in 2 weeks. She was informed of the importance of frequent follow-up visits to maximize her success with intensive lifestyle modifications for her multiple health conditions.  ALLERGIES: Allergies  Allergen Reactions  . Bupropion Other (See Comments)    Reaction:  Constipation, states just the extended release    MEDICATIONS: Current Outpatient Medications on File Prior to Visit  Medication Sig Dispense Refill  . buPROPion (WELLBUTRIN) 100 MG tablet Take 100 mg by mouth 2 (two) times daily.    [name] losartan (COZAAR) 25 MG tablet Take 1 tablet (25 mg total) by mouth daily. 90 tablet 2  . norethindrone (ORTHO MICRONOR) 0.35 MG tablet Take 1 tablet (0.35 mg total) by mouth daily. 3 Package 4  . Vitamin D, Ergocalciferol, 50 MCG (2000 UT) CAPS Take 2,000 Units by mouth daily. 30 capsule 0   No current facility-administered medications on file prior to visit.     PAST MEDICAL HISTORY: Past Medical History:  Diagnosis Date  . Anemia   . Anxiety   . Constipation   . Depression   . Dyspnea   . HTN (hypertension)   . Knee pain   . Leg edema   . Medical history non-contributory   . Obesity   . Pain in both feet   . Prolonged QT interval   . Pulmonary thromboembolism ([place])   . Suicide ideation     PAST SURGICAL HISTORY: Past Surgical History:  Procedure Laterality Date  . NO PAST SURGERIES    . TEETH REMOVAL  07/2017    SOCIAL HISTORY: Social History   Tobacco Use  . Smoking status: Never Smoker  . Smokeless tobacco: Never Used  Substance Use Topics  . Alcohol use: No  . Drug use: No    FAMILY HISTORY: Family History  Problem Relation Age of Onset  . Healthy Mother   . Hypertension Mother   . Obesity Mother   . Healthy Father   . Pulmonary embolism  Neg Hx   . Deep vein thrombosis Neg Hx    ROS: Review of Systems  Endo/Heme/Allergies:       Negative for polyphagia.  Psychiatric/Behavioral: Positive for depression (emotional eating). Negative for suicidal ideas.       Negative for homicidal ideas.   PHYSICAL EXAM: Pt in no acute distress  RECENT LABS AND TESTS: BMET    Component Value Date/Time   NA 140 04/22/2018 0915   K 4.4 04/22/2018 0915   CL 105 04/22/2018 0915   CO2 22 04/22/2018 0915   GLUCOSE 84 04/22/2018 0915   GLUCOSE 98 10/15/2017 1635   BUN 11 04/22/2018 0915   CREATININE 1.00 04/22/2018 0915   CALCIUM 9.4 04/22/2018 0915   GFRNONAA 77 04/22/2018 0915   GFRAA 89 04/22/2018 0915   Lab Results  Component Value Date   HGBA1C 5.3 04/22/2018   HGBA1C 5.5 12/29/2017   HGBA1C 5.8 08/07/2017   Lab Results  Component  Value Date   INSULIN 11.9 04/22/2018   INSULIN 21.8 12/29/2017   CBC    Component Value Date/Time   WBC 5.0 04/22/2018 0915   WBC 5.7 08/07/2017 0938   RBC 4.70 04/22/2018 0915   RBC 4.90 08/07/2017 0938   HGB 11.9 04/22/2018 0915   HCT 37.9 04/22/2018 0915   PLT 354.0 08/07/2017 0938   MCV 81 04/22/2018 0915   MCH 25.3 (L) 04/22/2018 0915   MCH 24.5 (L) 07/06/2016 0536   MCHC 31.4 (L) 04/22/2018 0915   MCHC 32.7 08/07/2017 0938   RDW 13.8 04/22/2018 0915   LYMPHSABS 2.0 04/22/2018 0915   EOSABS 0.2 04/22/2018 0915   BASOSABS 0.1 04/22/2018 0915   Iron/TIBC/Ferritin/ %Sat    Component Value Date/Time   IRON 36 04/22/2018 0915   TIBC 340 04/22/2018 0915   FERRITIN 15 04/22/2018 0915   IRONPCTSAT 11 (L) 04/22/2018 0915   Lipid Panel     Component Value Date/Time   CHOL 193 04/22/2018 0915   TRIG 42 04/22/2018 0915   HDL 43 04/22/2018 0915   CHOLHDL 6 08/07/2017 0938   VLDL 22.0 08/07/2017 0938   LDLCALC 142 (H) 04/22/2018 0915   Hepatic Function Panel     Component Value Date/Time   PROT 7.1 04/22/2018 0915   ALBUMIN 4.4 04/22/2018 0915   AST 11 04/22/2018 0915    ALT 9 04/22/2018 0915   ALKPHOS 123 (H) 04/22/2018 0915   BILITOT 0.5 04/22/2018 0915      Component Value Date/Time   TSH 0.792 12/29/2017 1051   TSH 1.16 08/07/2017 0938   TSH 1.69 08/06/2016 0855   Results for JAMIELEE, Shannon Shannon [name] (MRN [id number]) as of 08/05/2018 14:40  Ref. Range 04/22/2018 09:15  Vitamin D, 25-Hydroxy Latest Ref Range: 30.0 - 100.0 ng/mL 57.2    I, Michaelene Song, am acting as Location manager for [employer], FNP-C.  I have reviewed the above documentation for accuracy and completeness, and I agree with the above.  - Kalasia Crafton, FNP-C.

## 2018-08-06 ENCOUNTER — Encounter (INDEPENDENT_AMBULATORY_CARE_PROVIDER_SITE_OTHER): Payer: Self-pay | Admitting: Family Medicine

## 2018-08-12 DIAGNOSIS — F3341 Major depressive disorder, recurrent, in partial remission: Secondary | ICD-10-CM | POA: Diagnosis not present

## 2018-08-19 ENCOUNTER — Encounter (INDEPENDENT_AMBULATORY_CARE_PROVIDER_SITE_OTHER): Payer: Self-pay | Admitting: Family Medicine

## 2018-08-19 ENCOUNTER — Other Ambulatory Visit: Payer: Self-pay

## 2018-08-19 ENCOUNTER — Ambulatory Visit (INDEPENDENT_AMBULATORY_CARE_PROVIDER_SITE_OTHER): Payer: BLUE CROSS/BLUE SHIELD | Admitting: Family Medicine

## 2018-08-19 DIAGNOSIS — E8881 Metabolic syndrome: Secondary | ICD-10-CM | POA: Diagnosis not present

## 2018-08-19 DIAGNOSIS — Z6837 Body mass index (BMI) 37.0-37.9, adult: Secondary | ICD-10-CM

## 2018-08-19 DIAGNOSIS — F3341 Major depressive disorder, recurrent, in partial remission: Secondary | ICD-10-CM | POA: Diagnosis not present

## 2018-08-19 NOTE — Progress Notes (Signed)
Office: [phone]  /  Fax: [phone] TeleHealth Visit:  Shannon [name] has verbally consented to this TeleHealth visit today. The patient is located at home, the provider is located at the [facility] office. The participants in this visit include the listed provider and patient. The visit was conducted today via FaceTime.  HPI:   Chief Complaint: OBESITY Shannon [name] is here to discuss her progress with her obesity treatment plan. She is on the Category 3 plan or journaling 1300-1400 calories + 85-90 grams of protein and is following her eating plan approximately 25% of the time. She states she is walking 20 minutes 3 times per week. Shannon [name] is not doing well on the plan and is not journaling. She does report she is not eating as much candy.  We were unable to weigh the patient today for this TeleHealth visit. She feels as if she has maintained her weight since her last visit. She has lost 10 lbs since starting treatment with [place].  Insulin Resistance Shannon [name] has a diagnosis of insulin resistance based on her elevated fasting insulin level >5. Although Shannon [name]'s blood glucose readings are still under good control, insulin resistance puts her at greater risk of metabolic syndrome and diabetes. Shannon [name] was previously pre-diabetic. Her metformin was increased to BID at her last office visit. She cannot tell that it helps with her appetite but she would like to continue it. She continues to work on diet and exercise to decrease risk of diabetes.  ASSESSMENT AND PLAN:  Insulin resistance  Class 2 severe obesity with serious comorbidity and body mass index (BMI) of 37.0 to 37.9 in adult, unspecified obesity type ([place])  PLAN:  Insulin Resistance Shannon [name] will continue to work on weight loss, exercise, and decreasing simple carbohydrates in her diet to help decrease the risk of diabetes. We dicussed metformin including benefits and risks. She was informed that eating too many simple  carbohydrates or too many calories at one sitting increases the likelihood of GI side effects. Shannon [name] will continue metformin and follow-up with [place] as directed to monitor her progress.  I spent > than 50% of the 15 minute visit on counseling as documented in the note.  Obesity Shannon [name] is not currently in the action stage of change. As such, her goal is to continue with weight loss efforts. She has agreed to switch back to the Category 3 plan. It is noted that Shannon [name] was not very engaged in our conversation today. Shannon [name] has been instructed to work up to a goal of 150 minutes of combined cardio and strengthening exercise per week for weight loss and overall health benefits. We discussed the following Behavioral Modification Strategies today: increasing lean protein intake, decreasing simple carbohydrates, better snacking choices, avoiding temptations, and planning for success.  Shannon [name] has agreed to follow-up with our clinic in 2-3 weeks. She was informed of the importance of frequent follow-up visits to maximize her success with intensive lifestyle modifications for her multiple health conditions.  ALLERGIES: Allergies  Allergen Reactions  . Bupropion Other (See Comments)    Reaction:  Constipation, states just the extended release    MEDICATIONS: Current Outpatient Medications on File Prior to Visit  Medication Sig Dispense Refill  . buPROPion (WELLBUTRIN) 100 MG tablet Take 100 mg by mouth 2 (two) times daily.    [name] losartan (COZAAR) 25 MG tablet Take 1 tablet (25 mg total) by mouth daily. 90 tablet 2  . metFORMIN (GLUCOPHAGE) 500 MG tablet Take 1 tablet (500 mg total)  by mouth 2 (two) times daily with a meal. 60 tablet 0  . norethindrone (ORTHO MICRONOR) 0.35 MG tablet Take 1 tablet (0.35 mg total) by mouth daily. 3 Package 4  . Vitamin D, Ergocalciferol, 50 MCG (2000 UT) CAPS Take 2,000 Units by mouth daily. 30 capsule 0   No current facility-administered medications on file prior to  visit.     PAST MEDICAL HISTORY: Past Medical History:  Diagnosis Date  . Anemia   . Anxiety   . Constipation   . Depression   . Dyspnea   . HTN (hypertension)   . Knee pain   . Leg edema   . Medical history non-contributory   . Obesity   . Pain in both feet   . Prolonged QT interval   . Pulmonary thromboembolism ([place])   . Suicide ideation     PAST SURGICAL HISTORY: Past Surgical History:  Procedure Laterality Date  . NO PAST SURGERIES    . TEETH REMOVAL  07/2017    SOCIAL HISTORY: Social History   Tobacco Use  . Smoking status: Never Smoker  . Smokeless tobacco: Never Used  Substance Use Topics  . Alcohol use: No  . Drug use: No    FAMILY HISTORY: Family History  Problem Relation Age of Onset  . Healthy Mother   . Hypertension Mother   . Obesity Mother   . Healthy Father   . Pulmonary embolism Neg Hx   . Deep vein thrombosis Neg Hx     ROS: ROS none noted.  PHYSICAL EXAM: Pt in no acute distress  RECENT LABS AND TESTS: BMET    Component Value Date/Time   NA 140 04/22/2018 0915   K 4.4 04/22/2018 0915   CL 105 04/22/2018 0915   CO2 22 04/22/2018 0915   GLUCOSE 84 04/22/2018 0915   GLUCOSE 98 10/15/2017 1635   BUN 11 04/22/2018 0915   CREATININE 1.00 04/22/2018 0915   CALCIUM 9.4 04/22/2018 0915   GFRNONAA 77 04/22/2018 0915   GFRAA 89 04/22/2018 0915   Lab Results  Component Value Date   HGBA1C 5.3 04/22/2018   HGBA1C 5.5 12/29/2017   HGBA1C 5.8 08/07/2017   Lab Results  Component Value Date   INSULIN 11.9 04/22/2018   INSULIN 21.8 12/29/2017   CBC    Component Value Date/Time   WBC 5.0 04/22/2018 0915   WBC 5.7 08/07/2017 0938   RBC 4.70 04/22/2018 0915   RBC 4.90 08/07/2017 0938   HGB 11.9 04/22/2018 0915   HCT 37.9 04/22/2018 0915   PLT 354.0 08/07/2017 0938   MCV 81 04/22/2018 0915   MCH 25.3 (L) 04/22/2018 0915   MCH 24.5 (L) 07/06/2016 0536   MCHC 31.4 (L) 04/22/2018 0915   MCHC 32.7 08/07/2017 0938   RDW 13.8  04/22/2018 0915   LYMPHSABS 2.0 04/22/2018 0915   EOSABS 0.2 04/22/2018 0915   BASOSABS 0.1 04/22/2018 0915   Iron/TIBC/Ferritin/ %Sat    Component Value Date/Time   IRON 36 04/22/2018 0915   TIBC 340 04/22/2018 0915   FERRITIN 15 04/22/2018 0915   IRONPCTSAT 11 (L) 04/22/2018 0915   Lipid Panel     Component Value Date/Time   CHOL 193 04/22/2018 0915   TRIG 42 04/22/2018 0915   HDL 43 04/22/2018 0915   CHOLHDL 6 08/07/2017 0938   VLDL 22.0 08/07/2017 0938   LDLCALC 142 (H) 04/22/2018 0915   Hepatic Function Panel     Component Value Date/Time   PROT 7.1 04/22/2018 0915  ALBUMIN 4.4 04/22/2018 0915   AST 11 04/22/2018 0915   ALT 9 04/22/2018 0915   ALKPHOS 123 (H) 04/22/2018 0915   BILITOT 0.5 04/22/2018 0915      Component Value Date/Time   TSH 0.792 12/29/2017 1051   TSH 1.16 08/07/2017 0938   TSH 1.69 08/06/2016 0855   Results for AUBRY, RANKIN (MRN [id number]) as of 08/19/2018 15:07  Ref. Range 04/22/2018 09:15  Vitamin D, 25-Hydroxy Latest Ref Range: 30.0 - 100.0 ng/mL 57.2    I, Michaelene Song, am acting as Location manager for [employer], FNP-C.  I have reviewed the above documentation for accuracy and completeness, and I agree with the above.  - Dawn Whitmire, FNP-C.

## 2018-08-20 ENCOUNTER — Encounter (INDEPENDENT_AMBULATORY_CARE_PROVIDER_SITE_OTHER): Payer: Self-pay | Admitting: Family Medicine

## 2018-08-26 DIAGNOSIS — F3341 Major depressive disorder, recurrent, in partial remission: Secondary | ICD-10-CM | POA: Diagnosis not present

## 2018-09-02 DIAGNOSIS — F3341 Major depressive disorder, recurrent, in partial remission: Secondary | ICD-10-CM | POA: Diagnosis not present

## 2018-09-04 DIAGNOSIS — F332 Major depressive disorder, recurrent severe without psychotic features: Secondary | ICD-10-CM | POA: Diagnosis not present

## 2018-09-07 ENCOUNTER — Ambulatory Visit (INDEPENDENT_AMBULATORY_CARE_PROVIDER_SITE_OTHER): Payer: BLUE CROSS/BLUE SHIELD | Admitting: Family Medicine

## 2018-09-07 DIAGNOSIS — F332 Major depressive disorder, recurrent severe without psychotic features: Secondary | ICD-10-CM | POA: Diagnosis not present

## 2018-09-09 DIAGNOSIS — F332 Major depressive disorder, recurrent severe without psychotic features: Secondary | ICD-10-CM | POA: Diagnosis not present

## 2018-09-10 ENCOUNTER — Other Ambulatory Visit: Payer: Self-pay

## 2018-09-10 ENCOUNTER — Encounter (INDEPENDENT_AMBULATORY_CARE_PROVIDER_SITE_OTHER): Payer: Self-pay | Admitting: Physician Assistant

## 2018-09-10 ENCOUNTER — Ambulatory Visit (INDEPENDENT_AMBULATORY_CARE_PROVIDER_SITE_OTHER): Payer: BC Managed Care – PPO | Admitting: Physician Assistant

## 2018-09-10 VITALS — BP 139/88 | HR 101 | Temp 99.0°F | Ht 69.0 in | Wt 256.0 lb

## 2018-09-10 DIAGNOSIS — Z6837 Body mass index (BMI) 37.0-37.9, adult: Secondary | ICD-10-CM

## 2018-09-10 DIAGNOSIS — Z9189 Other specified personal risk factors, not elsewhere classified: Secondary | ICD-10-CM | POA: Diagnosis not present

## 2018-09-10 DIAGNOSIS — E8881 Metabolic syndrome: Secondary | ICD-10-CM | POA: Diagnosis not present

## 2018-09-10 DIAGNOSIS — E559 Vitamin D deficiency, unspecified: Secondary | ICD-10-CM

## 2018-09-11 DIAGNOSIS — F332 Major depressive disorder, recurrent severe without psychotic features: Secondary | ICD-10-CM | POA: Diagnosis not present

## 2018-09-14 DIAGNOSIS — F332 Major depressive disorder, recurrent severe without psychotic features: Secondary | ICD-10-CM | POA: Diagnosis not present

## 2018-09-14 MED ORDER — METFORMIN HCL 500 MG PO TABS: 500.0000 mg | ORAL_TABLET | Freq: Two times a day (BID) | ORAL | 0 refills | Status: DC

## 2018-09-14 NOTE — Progress Notes (Signed)
Office: [phone]  /  Fax: [phone]   HPI:   Chief Complaint: OBESITY Mechel is here to discuss her progress with her obesity treatment plan. She switched back to the Category 3 plan at the last visit. She reports that she has only been journaling the last few days and is following her eating plan approximately 25 % of the time. She states she is walking 20 minutes 3 times per week. Harmonee is not meeting her protein or calorie goals. Her cravings for candy have decreased. Her weight is 256 lb (116.1 kg) today and she has had a weight gain of 2 pounds over a period of 3 weeks since her last visit. She has lost 7 lbs since starting treatment with [place].  Insulin Resistance Philomena has a diagnosis of insulin resistance based on her elevated fasting insulin level >5. Although Bette's blood glucose readings are still under good control, insulin resistance puts her at greater risk of metabolic syndrome and diabetes. Blaine is taking metformin currently and she denies nausea, vomiting or diarrhea. She continues to work on diet and exercise to decrease risk of diabetes. Denean denies polyphagia.  At risk for diabetes Eve is at higher than average risk for developing diabetes due to her obesity and insulin resistance. She currently denies polyuria or polydipsia.  Vitamin D deficiency Shaton has a diagnosis of vitamin D deficiency. Larin is currently taking OTC vit D and she denies nausea, vomiting or muscle weakness.  ASSESSMENT AND PLAN:  Insulin resistance - Plan: metFORMIN (GLUCOPHAGE) 500 MG tablet  Vitamin D deficiency  At risk for diabetes mellitus  Class 2 severe obesity with serious comorbidity and body mass index (BMI) of 37.0 to 37.9 in adult, unspecified obesity type ([place])  PLAN:  Insulin Resistance Marilou will continue to work on weight loss, exercise, and decreasing simple carbohydrates in her diet to help decrease the risk of diabetes. We dicussed metformin including  benefits and risks. She was informed that eating too many simple carbohydrates or too many calories at one sitting increases the likelihood of GI side effects. Bernell agrees to continue metformin 500 mg two times daily with meals #60 with no refills and  follow up with [place] as directed to monitor her progress.  Diabetes risk counseling Magdelene was given extended (15 minutes) diabetes prevention counseling today. She is 28 y.o. female and has risk factors for diabetes including obesity and insulin resistance. We discussed intensive lifestyle modifications today with an emphasis on weight loss as well as increasing exercise and decreasing simple carbohydrates in her diet.  Vitamin D Deficiency Janice was informed that low vitamin D levels contributes to fatigue and are associated with obesity, breast, and colon cancer. She will continue to take OTC vitamin D and will follow up for routine testing of vitamin D, at least 2-3 times per year. She was informed of the risk of over-replacement of vitamin D and agrees to not increase her dose unless she discusses this with [place] first.  Obesity Kaisy is currently in the action stage of change. As such, her goal is to continue with weight loss efforts She has agreed to keep a food journal with 1400 to 1500 calories and 90 grams of protein daily Hareem has been instructed to work up to a goal of 150 minutes of combined cardio and strengthening exercise per week for weight loss and overall health benefits. We discussed the following Behavioral Modification Strategies today: no skipping meals, increasing lean protein intake and work on meal  planning and easy cooking plans  Ruberta has agreed to follow up with our clinic in 2 weeks. She was informed of the importance of frequent follow up visits to maximize her success with intensive lifestyle modifications for her multiple health conditions.  ALLERGIES: Allergies  Allergen Reactions   Bupropion Other (See Comments)     Reaction:  Constipation, states just the extended release    MEDICATIONS: Current Outpatient Medications on File Prior to Visit  Medication Sig Dispense Refill   buPROPion (WELLBUTRIN SR) 150 MG 12 hr tablet Take 150 mg by mouth 2 (two) times daily.     losartan (COZAAR) 25 MG tablet Take 1 tablet (25 mg total) by mouth daily. 90 tablet 2   metFORMIN (GLUCOPHAGE) 500 MG tablet Take 1 tablet (500 mg total) by mouth 2 (two) times daily with a meal. 60 tablet 0   norethindrone (ORTHO MICRONOR) 0.35 MG tablet Take 1 tablet (0.35 mg total) by mouth daily. 3 Package 4   Vitamin D, Ergocalciferol, 50 MCG (2000 UT) CAPS Take 2,000 Units by mouth daily. 30 capsule 0   No current facility-administered medications on file prior to visit.     PAST MEDICAL HISTORY: Past Medical History:  Diagnosis Date   Anemia    Anxiety    Constipation    Depression    Dyspnea    HTN (hypertension)    Knee pain    Leg edema    Medical history non-contributory    Obesity    Pain in both feet    Prolonged QT interval    Pulmonary thromboembolism ([place])    Suicide ideation     PAST SURGICAL HISTORY: Past Surgical History:  Procedure Laterality Date   NO PAST SURGERIES     TEETH REMOVAL  07/2017    SOCIAL HISTORY: Social History   Tobacco Use   Smoking status: Never Smoker   Smokeless tobacco: Never Used  Substance Use Topics   Alcohol use: No   Drug use: No    FAMILY HISTORY: Family History  Problem Relation Age of Onset   Healthy Mother    Hypertension Mother    Obesity Mother    Healthy Father    Pulmonary embolism Neg Hx    Deep vein thrombosis Neg Hx     ROS: Review of Systems  Constitutional: Negative for weight loss.  Gastrointestinal: Negative for diarrhea, nausea and vomiting.  Genitourinary: Negative for frequency.  Musculoskeletal:       Negative for muscle weakness  Endo/Heme/Allergies: Negative for polydipsia.       Negative for  polyphagia    PHYSICAL EXAM: Blood pressure 139/88, pulse (!) 101, temperature 99 F (37.2 C), temperature source Oral, height 5\' 9"  (1.753 m), weight 256 lb (116.1 kg), SpO2 98 %. Body mass index is 37.8 kg/m. Physical Exam  RECENT LABS AND TESTS: BMET    Component Value Date/Time   NA 140 04/22/2018 0915   K 4.4 04/22/2018 0915   CL 105 04/22/2018 0915   CO2 22 04/22/2018 0915   GLUCOSE 84 04/22/2018 0915   GLUCOSE 98 10/15/2017 1635   BUN 11 04/22/2018 0915   CREATININE 1.00 04/22/2018 0915   CALCIUM 9.4 04/22/2018 0915   GFRNONAA 77 04/22/2018 0915   GFRAA 89 04/22/2018 0915   Lab Results  Component Value Date   HGBA1C 5.3 04/22/2018   HGBA1C 5.5 12/29/2017   HGBA1C 5.8 08/07/2017   Lab Results  Component Value Date   INSULIN 11.9 04/22/2018  INSULIN 21.8 12/29/2017   CBC    Component Value Date/Time   WBC 5.0 04/22/2018 0915   WBC 5.7 08/07/2017 0938   RBC 4.70 04/22/2018 0915   RBC 4.90 08/07/2017 0938   HGB 11.9 04/22/2018 0915   HCT 37.9 04/22/2018 0915   PLT 354.0 08/07/2017 0938   MCV 81 04/22/2018 0915   MCH 25.3 (L) 04/22/2018 0915   MCH 24.5 (L) 07/06/2016 0536   MCHC 31.4 (L) 04/22/2018 0915   MCHC 32.7 08/07/2017 0938   RDW 13.8 04/22/2018 0915   LYMPHSABS 2.0 04/22/2018 0915   EOSABS 0.2 04/22/2018 0915   BASOSABS 0.1 04/22/2018 0915   Iron/TIBC/Ferritin/ %Sat    Component Value Date/Time   IRON 36 04/22/2018 0915   TIBC 340 04/22/2018 0915   FERRITIN 15 04/22/2018 0915   IRONPCTSAT 11 (L) 04/22/2018 0915   Lipid Panel     Component Value Date/Time   CHOL 193 04/22/2018 0915   TRIG 42 04/22/2018 0915   HDL 43 04/22/2018 0915   CHOLHDL 6 08/07/2017 0938   VLDL 22.0 08/07/2017 0938   LDLCALC 142 (H) 04/22/2018 0915   Hepatic Function Panel     Component Value Date/Time   PROT 7.1 04/22/2018 0915   ALBUMIN 4.4 04/22/2018 0915   AST 11 04/22/2018 0915   ALT 9 04/22/2018 0915   ALKPHOS 123 (H) 04/22/2018 0915   BILITOT 0.5  04/22/2018 0915      Component Value Date/Time   TSH 0.792 12/29/2017 1051   TSH 1.16 08/07/2017 0938   TSH 1.69 08/06/2016 0855     Ref. Range 04/22/2018 09:15  Vitamin D, 25-Hydroxy Latest Ref Range: 30.0 - 100.0 ng/mL 57.2    OBESITY BEHAVIORAL INTERVENTION VISIT  Today's visit was # 16   Starting weight: 263 lbs Starting date: 12/29/2017 Today's weight : 256 lbs Today's date: 09/10/2018 Total lbs lost to date: 7    09/10/2018  Height 5\' 9"  (1.753 m)  Weight 256 lb (116.1 kg)  BMI (Calculated) 37.79  BLOOD PRESSURE - SYSTOLIC 549  BLOOD PRESSURE - DIASTOLIC 88   Body Fat % 82.6 %  Total Body Water (lbs) 90 lbs    ASK: We discussed the diagnosis of obesity with Marcene Brawn today and Yeraldy agreed to give [place] permission to discuss obesity behavioral modification therapy today.  ASSESS: Kerin has the diagnosis of obesity and her BMI today is 21.79 Tomesha is in the action stage of change   ADVISE: Demitra was educated on the multiple health risks of obesity as well as the benefit of weight loss to improve her health. She was advised of the need for long term treatment and the importance of lifestyle modifications to improve her current health and to decrease her risk of future health problems.  AGREE: Multiple dietary modification options and treatment options were discussed and  Richelle agreed to follow the recommendations documented in the above note.  ARRANGE: Tanee was educated on the importance of frequent visits to treat obesity as outlined per CMS and USPSTF guidelines and agreed to schedule her next follow up appointment today.  Corey Skains, am acting as transcriptionist for Abby Potash, PA-C I, Abby Potash, PA-C have reviewed above note and agree with its content

## 2018-09-15 ENCOUNTER — Other Ambulatory Visit: Payer: Self-pay

## 2018-09-15 ENCOUNTER — Encounter: Payer: Self-pay | Admitting: Family

## 2018-09-15 ENCOUNTER — Ambulatory Visit (INDEPENDENT_AMBULATORY_CARE_PROVIDER_SITE_OTHER): Payer: BC Managed Care – PPO | Admitting: Family

## 2018-09-15 DIAGNOSIS — I10 Essential (primary) hypertension: Secondary | ICD-10-CM

## 2018-09-15 MED ORDER — LOSARTAN POTASSIUM 25 MG PO TABS: 25.0000 mg | ORAL_TABLET | Freq: Every day | ORAL | 2 refills | Status: DC

## 2018-09-15 NOTE — Progress Notes (Signed)
Shannon [name] is a 28 y.o. female with the following history as recorded in EpicCare:  Patient Active Problem List   Diagnosis Date Noted  . Other hyperlipidemia 04/22/2018  . Insulin resistance 04/02/2018  . Shortness of breath on exertion 12/29/2017  . Essential hypertension 12/29/2017  . Vitamin D deficiency 12/29/2017  . Class 2 severe obesity with serious comorbidity and body mass index (BMI) of 37.0 to 37.9 in adult ([place]) 08/11/2017  . Routine general medical examination at a health care facility 08/10/2017  . Hypertension 12/30/2016  . Slurred speech 12/30/2016  . Iron deficiency anemia 09/24/2016  . Fatigue 08/06/2016  . Pulmonary embolism with acute cor pulmonale ([place]) 07/04/2016  . Hypokalemia 07/04/2016  . Prolonged QT interval 07/04/2016  . Depression 07/04/2016  . Adjustment disorder with emotional disturbance 03/31/2016  . Major depressive disorder, recurrent severe without psychotic features ([place]) 01/10/2016  . Major depressive disorder, recurrent episode, mild ([place]) 01/01/2016  . Suicide ideation 02/16/2013    Current Outpatient Medications  Medication Sig Dispense Refill  . buPROPion (WELLBUTRIN SR) 150 MG 12 hr tablet Take 150 mg by mouth 2 (two) times daily.    [name] losartan (COZAAR) 25 MG tablet Take 1 tablet (25 mg total) by mouth daily. 90 tablet 2  . metFORMIN (GLUCOPHAGE) 500 MG tablet Take 1 tablet (500 mg total) by mouth 2 (two) times daily with a meal. 60 tablet 0  . norethindrone (ORTHO MICRONOR) 0.35 MG tablet Take 1 tablet (0.35 mg total) by mouth daily. 3 Package 4  . Vitamin D, Ergocalciferol, 50 MCG (2000 UT) CAPS Take 2,000 Units by mouth daily. 30 capsule 0   No current facility-administered medications for this visit.     Allergies: Bupropion  Past Medical History:  Diagnosis Date  . Anemia   . Anxiety   . Constipation   . Depression   . Dyspnea   . HTN (hypertension)   . Knee pain   . Leg edema   . Medical history non-contributory   .  Obesity   . Pain in both feet   . Prolonged QT interval   . Pulmonary thromboembolism ([place])   . Suicide ideation     Past Surgical History:  Procedure Laterality Date  . NO PAST SURGERIES    . TEETH REMOVAL  07/2017    Family History  Problem Relation Age of Onset  . Healthy Mother   . Hypertension Mother   . Obesity Mother   . Healthy Father   . Pulmonary embolism Neg Hx   . Deep vein thrombosis Neg Hx     Social History   Tobacco Use  . Smoking status: Never Smoker  . Smokeless tobacco: Never Used  Substance Use Topics  . Alcohol use: No    Subjective:  Patient presents for 6 month follow-up on hypertension; in baseline state of health; Denies any chest pain, shortness of breath, blurred vision or headache. Working with weight loss clinic- every 2 months; sees GYN regularly- has CPE upcoming with GYN next month;  Recently graduated with Master's Degree from [employer] with [employer] degree;      Objective:  Vitals:   09/15/18 1046  BP: 132/82  Pulse: 97  Temp: 98.6 F (37 C)  TempSrc: Oral  SpO2: 98%  Weight: 258 lb 0.6 oz (117 kg)  Height: 5\' 9"  (1.753 m)    General: Well developed, well nourished, in no acute distress  Skin : Warm and dry.  Head: Normocephalic and atraumatic  Lungs: Respirations unlabored; clear to auscultation bilaterally without wheeze, rales, rhonchi  CVS exam: normal rate and regular rhythm.  Neurologic: Alert and oriented; speech intact; face symmetrical; moves all extremities well; CNII-XII intact without focal deficit   Assessment:  1. Essential hypertension     Plan:  Stable; continue Losartan 25 mg daily- refill updated; labs are being managed by weight loss provider; CPE is scheduled with GYN in the next month.   No follow-ups on file.  No orders of the defined types were placed in this encounter.   Requested Prescriptions   Signed Prescriptions Disp Refills  . losartan (COZAAR) 25 MG tablet 90 tablet 2     Sig: Take 1 tablet (25 mg total) by mouth daily.

## 2018-09-16 DIAGNOSIS — F332 Major depressive disorder, recurrent severe without psychotic features: Secondary | ICD-10-CM | POA: Diagnosis not present

## 2018-09-18 DIAGNOSIS — F332 Major depressive disorder, recurrent severe without psychotic features: Secondary | ICD-10-CM | POA: Diagnosis not present

## 2018-09-21 DIAGNOSIS — F332 Major depressive disorder, recurrent severe without psychotic features: Secondary | ICD-10-CM | POA: Diagnosis not present

## 2018-09-22 ENCOUNTER — Encounter (INDEPENDENT_AMBULATORY_CARE_PROVIDER_SITE_OTHER): Payer: Self-pay

## 2018-09-23 ENCOUNTER — Other Ambulatory Visit: Payer: Self-pay

## 2018-09-23 ENCOUNTER — Encounter (INDEPENDENT_AMBULATORY_CARE_PROVIDER_SITE_OTHER): Payer: Self-pay | Admitting: Physician Assistant

## 2018-09-23 ENCOUNTER — Ambulatory Visit (INDEPENDENT_AMBULATORY_CARE_PROVIDER_SITE_OTHER): Payer: BC Managed Care – PPO | Admitting: Physician Assistant

## 2018-09-23 VITALS — BP 121/85 | HR 96 | Temp 99.1°F | Ht 69.0 in | Wt 254.0 lb

## 2018-09-23 DIAGNOSIS — Z6837 Body mass index (BMI) 37.0-37.9, adult: Secondary | ICD-10-CM | POA: Diagnosis not present

## 2018-09-23 DIAGNOSIS — E669 Obesity, unspecified: Secondary | ICD-10-CM

## 2018-09-23 DIAGNOSIS — F331 Major depressive disorder, recurrent, moderate: Secondary | ICD-10-CM | POA: Diagnosis not present

## 2018-09-23 DIAGNOSIS — E559 Vitamin D deficiency, unspecified: Secondary | ICD-10-CM

## 2018-09-23 NOTE — Progress Notes (Signed)
Office: [phone]  /  Fax: [phone]   HPI:   Chief Complaint: OBESITY Shannon [name] is here to discuss her progress with her obesity treatment plan. She is keeping a food journal with 1400-1500 calories and 90 grams of protein and is following her eating plan approximately 75% of the time. She states she is walking 20 minutes 3 times per week. Shannon [name] reports that she is not always meeting her calorie or protein goals during the day. She is sometimes skipping lunch due to having to take a medication or lack of hunger.  Her weight is 254 lb (115.2 kg) today and has had a weight loss of 2 pounds over a period of 2 weeks since her last visit. She has lost 9 lbs since starting treatment with [place].  Vitamin D deficiency Shannon [name] has a diagnosis of Vitamin D deficiency. She is currently taking OTC Vit D and denies nausea, vomiting or muscle weakness.  ASSESSMENT AND PLAN:  Vitamin D deficiency  Class 2 obesity without serious comorbidity with body mass index (BMI) of 37.0 to 37.9 in adult, unspecified obesity type  PLAN:  Vitamin D Deficiency Shannon [name] was informed that low Vitamin D levels contributes to fatigue and are associated with obesity, breast, and colon cancer. She agrees to continue taking OTC Vit D and will follow-up for routine testing of Vitamin D, at least 2-3 times per year. She was informed of the risk of over-replacement of Vitamin D and agrees to not increase her dose unless she discusses this with [place] first. Shannon [name] agrees to follow-up with our clinic in 2 weeks.  Obesity Shannon [name] is currently in the action stage of change. As such, her goal is to continue with weight loss efforts. She has agreed to keep a food journal with 1400-1500 calories and 90 grams of protein daily. Shannon [name] has been instructed to work up to a goal of 150 minutes of combined cardio and strengthening exercise per week for weight loss and overall health benefits. We discussed the following Behavioral  Modification Strategies today: increasing lean protein intake, work on meal planning and easy cooking plans.  Shannon [name] has agreed to follow-up with our clinic in 2 weeks. She was informed of the importance of frequent follow-up visits to maximize her success with intensive lifestyle modifications for her multiple health conditions.  ALLERGIES: Allergies  Allergen Reactions   Bupropion Other (See Comments)    Reaction:  Constipation, states just the extended release    MEDICATIONS: Current Outpatient Medications on File Prior to Visit  Medication Sig Dispense Refill   buPROPion (WELLBUTRIN SR) 150 MG 12 hr tablet Take 150 mg by mouth 2 (two) times daily.     losartan (COZAAR) 25 MG tablet Take 1 tablet (25 mg total) by mouth daily. 90 tablet 2   metFORMIN (GLUCOPHAGE) 500 MG tablet Take 1 tablet (500 mg total) by mouth 2 (two) times daily with a meal. 60 tablet 0   norethindrone (ORTHO MICRONOR) 0.35 MG tablet Take 1 tablet (0.35 mg total) by mouth daily. 3 Package 4   Vitamin D, Ergocalciferol, 50 MCG (2000 UT) CAPS Take 2,000 Units by mouth daily. 30 capsule 0   No current facility-administered medications on file prior to visit.     PAST MEDICAL HISTORY: Past Medical History:  Diagnosis Date   Anemia    Anxiety    Constipation    Depression    Dyspnea    HTN (hypertension)    Knee pain    Leg edema  Medical history non-contributory    Obesity    Pain in both feet    Prolonged QT interval    Pulmonary thromboembolism ([facility])    Suicide ideation     PAST SURGICAL HISTORY: Past Surgical History:  Procedure Laterality Date   NO PAST SURGERIES     TEETH REMOVAL  07/2017    SOCIAL HISTORY: Social History   Tobacco Use   Smoking status: Never Smoker   Smokeless tobacco: Never Used  Substance Use Topics   Alcohol use: No   Drug use: No    FAMILY HISTORY: Family History  Problem Relation Age of Onset   Healthy Mother    Hypertension  Mother    Obesity Mother    Healthy Father    Pulmonary embolism Neg Hx    Deep vein thrombosis Neg Hx    ROS: Review of Systems  Gastrointestinal: Negative for nausea and vomiting.  Musculoskeletal:       Negative for muscle weakness.   PHYSICAL EXAM: Blood pressure 121/85, pulse 96, temperature 99.1 F (37.3 C), temperature source Oral, height 5\' 9"  (1.753 m), weight 254 lb (115.2 kg), SpO2 97 %. Body mass index is 37.51 kg/m. Physical Exam Vitals signs reviewed.  Constitutional:      Appearance: Normal appearance. She is obese.  Cardiovascular:     Rate and Rhythm: Normal rate.     Pulses: Normal pulses.  Pulmonary:     Effort: Pulmonary effort is normal.     Breath sounds: Normal breath sounds.  Musculoskeletal: Normal range of motion.  Skin:    General: Skin is warm and dry.  Neurological:     Mental Status: She is alert and oriented to person, place, and time.  Psychiatric:        Behavior: Behavior normal.   RECENT LABS AND TESTS: BMET    Component Value Date/Time   NA 140 04/22/2018 0915   K 4.4 04/22/2018 0915   CL 105 04/22/2018 0915   CO2 22 04/22/2018 0915   GLUCOSE 84 04/22/2018 0915   GLUCOSE 98 10/15/2017 1635   BUN 11 04/22/2018 0915   CREATININE 1.00 04/22/2018 0915   CALCIUM 9.4 04/22/2018 0915   GFRNONAA 77 04/22/2018 0915   GFRAA 89 04/22/2018 0915   Lab Results  Component Value Date   HGBA1C 5.3 04/22/2018   HGBA1C 5.5 12/29/2017   HGBA1C 5.8 08/07/2017   Lab Results  Component Value Date   INSULIN 11.9 04/22/2018   INSULIN 21.8 12/29/2017   CBC    Component Value Date/Time   WBC 5.0 04/22/2018 0915   WBC 5.7 08/07/2017 0938   RBC 4.70 04/22/2018 0915   RBC 4.90 08/07/2017 0938   HGB 11.9 04/22/2018 0915   HCT 37.9 04/22/2018 0915   PLT 354.0 08/07/2017 0938   MCV 81 04/22/2018 0915   MCH 25.3 (L) 04/22/2018 0915   MCH 24.5 (L) 07/06/2016 0536   MCHC 31.4 (L) 04/22/2018 0915   MCHC 32.7 08/07/2017 0938   RDW 13.8  04/22/2018 0915   LYMPHSABS 2.0 04/22/2018 0915   EOSABS 0.2 04/22/2018 0915   BASOSABS 0.1 04/22/2018 0915   Iron/TIBC/Ferritin/ %Sat    Component Value Date/Time   IRON 36 04/22/2018 0915   TIBC 340 04/22/2018 0915   FERRITIN 15 04/22/2018 0915   IRONPCTSAT 11 (L) 04/22/2018 0915   Lipid Panel     Component Value Date/Time   CHOL 193 04/22/2018 0915   TRIG 42 04/22/2018 0915   HDL 43 04/22/2018  0915   CHOLHDL 6 08/07/2017 0938   VLDL 22.0 08/07/2017 0938   LDLCALC 142 (H) 04/22/2018 0915   Hepatic Function Panel     Component Value Date/Time   PROT 7.1 04/22/2018 0915   ALBUMIN 4.4 04/22/2018 0915   AST 11 04/22/2018 0915   ALT 9 04/22/2018 0915   ALKPHOS 123 (H) 04/22/2018 0915   BILITOT 0.5 04/22/2018 0915      Component Value Date/Time   TSH 0.792 12/29/2017 1051   TSH 1.16 08/07/2017 0938   TSH 1.69 08/06/2016 0855   Results for XOIE, KREUSER (MRN [id number]) as of 09/23/2018 14:37  Ref. Range 04/22/2018 09:15  Vitamin D, 25-Hydroxy Latest Ref Range: 30.0 - 100.0 ng/mL 57.2   OBESITY BEHAVIORAL INTERVENTION VISIT  Today's visit was #17  Starting weight: 263 lbs Starting date: 12/29/2017 Today's weight: 254 lbs  Today's date: 09/23/2018 Total lbs lost to date: 9  ASK: We discussed the diagnosis of obesity with Marcene [name] today and Alyrica agreed to give [place] permission to discuss obesity behavioral modification therapy today.  ASSESS: Yoshie has the diagnosis of obesity and her BMI today is 37.6. Delta is in the action stage of change.   ADVISE: Yacine was educated on the multiple health risks of obesity as well as the benefit of weight loss to improve her health. She was advised of the need for long term treatment and the importance of lifestyle modifications to improve her current health and to decrease her risk of future health problems.  AGREE: Multiple dietary modification options and treatment options were discussed and  Ron agreed to follow  the recommendations documented in the above note.  ARRANGE: Chriss was educated on the importance of frequent visits to treat obesity as outlined per CMS and USPSTF guidelines and agreed to schedule her next follow up appointment today.  Migdalia Dk, am acting as Location manager for Abby Potash, PA-C I, Abby Potash, PA-C have reviewed above note and agree with its content

## 2018-09-25 DIAGNOSIS — F331 Major depressive disorder, recurrent, moderate: Secondary | ICD-10-CM | POA: Diagnosis not present

## 2018-09-28 DIAGNOSIS — F331 Major depressive disorder, recurrent, moderate: Secondary | ICD-10-CM | POA: Diagnosis not present

## 2018-09-30 ENCOUNTER — Encounter: Payer: Self-pay | Admitting: Women's Health

## 2018-09-30 ENCOUNTER — Other Ambulatory Visit: Payer: Self-pay

## 2018-09-30 ENCOUNTER — Ambulatory Visit: Payer: BC Managed Care – PPO | Admitting: Women's Health

## 2018-09-30 VITALS — BP 130/90 | Ht 69.0 in | Wt 256.0 lb

## 2018-09-30 DIAGNOSIS — Z01419 Encounter for gynecological examination (general) (routine) without abnormal findings: Secondary | ICD-10-CM

## 2018-09-30 DIAGNOSIS — F331 Major depressive disorder, recurrent, moderate: Secondary | ICD-10-CM | POA: Diagnosis not present

## 2018-09-30 MED ORDER — NORETHINDRONE 0.35 MG PO TABS: 1.0000 | ORAL_TABLET | Freq: Every day | ORAL | 4 refills | Status: DC
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
## 2018-09-30 NOTE — Patient Instructions (Signed)
Health Maintenance, Female Adopting a healthy lifestyle and getting preventive care are important in promoting health and wellness. Ask your health care provider about:  The right schedule for you to have regular tests and exams.  Things you can do on your own to prevent diseases and keep yourself healthy. What should I know about diet, weight, and exercise? Eat a healthy diet   Eat a diet that includes plenty of vegetables, fruits, low-fat dairy products, and lean protein.  Do not eat a lot of foods that are high in solid fats, added sugars, or sodium. Maintain a healthy weight Body mass index (BMI) is used to identify weight problems. It estimates body fat based on height and weight. Your health care provider can help determine your BMI and help you achieve or maintain a healthy weight. Get regular exercise Get regular exercise. This is one of the most important things you can do for your health. Most adults should:  Exercise for at least 150 minutes each week. The exercise should increase your heart rate and make you sweat (moderate-intensity exercise).  Do strengthening exercises at least twice a week. This is in addition to the moderate-intensity exercise.  Spend less time sitting. Even light physical activity can be beneficial. Watch cholesterol and blood lipids Have your blood tested for lipids and cholesterol at 29 years of age, then have this test every 5 years. Have your cholesterol levels checked more often if:  Your lipid or cholesterol levels are high.  You are older than 29 years of age.  You are at high risk for heart disease. What should I know about cancer screening? Depending on your health history and family history, you may need to have cancer screening at various ages. This may include screening for:  Breast cancer.  Cervical cancer.  Colorectal cancer.  Skin cancer.  Lung cancer. What should I know about heart disease, diabetes, and high blood  pressure? Blood pressure and heart disease  High blood pressure causes heart disease and increases the risk of stroke. This is more likely to develop in people who have high blood pressure readings, are of African descent, or are overweight.  Have your blood pressure checked: ? Every 3-5 years if you are 29-29 years of age. ? Every year if you are 44 years old or older. Diabetes Have regular diabetes screenings. This checks your fasting blood sugar level. Have the screening done:  Once every three years after age 35 if you are at a normal weight and have a low risk for diabetes.  More often and at a younger age if you are overweight or have a high risk for diabetes. What should I know about preventing infection? Hepatitis B If you have a higher risk for hepatitis B, you should be screened for this virus. Talk with your health care provider to find out if you are at risk for hepatitis B infection. Hepatitis C Testing is recommended for:  Everyone born from 29 through 29.  Anyone with known risk factors for hepatitis C. Sexually transmitted infections (STIs)  Get screened for STIs, including gonorrhea and chlamydia, if: ? You are sexually active and are younger than 29 years of age. ? You are older than 29 years of age and your health care provider tells you that you are at risk for this type of infection. ? Your sexual activity has changed since you were last screened, and you are at increased risk for chlamydia or gonorrhea. Ask your health care provider if  you are at risk.  Ask your health care provider about whether you are at high risk for HIV. Your health care provider may recommend a prescription medicine to help prevent HIV infection. If you choose to take medicine to prevent HIV, you should first get tested for HIV. You should then be tested every 3 months for as long as you are taking the medicine. Pregnancy  If you are about to stop having your period (premenopausal) and  you may become pregnant, seek counseling before you get pregnant.  Take 400 to 800 micrograms (mcg) of folic acid every day if you become pregnant.  Ask for birth control (contraception) if you want to prevent pregnancy. Osteoporosis and menopause Osteoporosis is a disease in which the bones lose minerals and strength with aging. This can result in bone fractures. If you are 58 years old or older, or if you are at risk for osteoporosis and fractures, ask your health care provider if you should:  Be screened for bone loss.  Take a calcium or vitamin D supplement to lower your risk of fractures.  Be given hormone replacement therapy (HRT) to treat symptoms of menopause. Follow these instructions at home: Lifestyle  Do not use any products that contain nicotine or tobacco, such as cigarettes, e-cigarettes, and chewing tobacco. If you need help quitting, ask your health care provider.  Do not use street drugs.  Do not share needles.  Ask your health care provider for help if you need support or information about quitting drugs. Alcohol use  Do not drink alcohol if: ? Your health care provider tells you not to drink. ? You are pregnant, may be pregnant, or are planning to become pregnant.  If you drink alcohol: ? Limit how much you use to 0-1 drink a day. ? Limit intake if you are breastfeeding.  Be aware of how much alcohol is in your drink. In the [place] one drink equals one 12 oz bottle of beer (355 mL), one 5 oz glass of wine (148 mL), or one 1 oz glass of hard liquor (44 mL). General instructions  Schedule regular health, dental, and eye exams.  Stay current with your vaccines.  Tell your health care provider if: ? You often feel depressed. ? You have ever been abused or do not feel safe at home. Summary  Adopting a healthy lifestyle and getting preventive care are important in promoting health and wellness.  Follow your health care provider's instructions about healthy  diet, exercising, and getting tested or screened for diseases.  Follow your health care provider's instructions on monitoring your cholesterol and blood pressure. This information is not intended to replace advice given to you by your health care provider. Make sure you discuss any questions you have with your health care provider. Document Released: 09/24/2010 Document Revised: 03/04/2018 Document Reviewed: 03/04/2018 Elsevier Patient Education  2020 [place] is this medicine? ETONOGESTREL (et oh noe JES trel) is a contraceptive (birth control) device. It is used to prevent pregnancy. It can be used for up to 3 years. This medicine may be used for other purposes; ask your health care provider or pharmacist if you have questions. COMMON BRAND NAME(S): Implanon, Nexplanon What should I tell my health care provider before I take this medicine? They need to know if you have any of these conditions:  abnormal vaginal bleeding  blood vessel disease or blood clots  breast, cervical, endometrial, ovarian, liver, or uterine cancer  diabetes  gallbladder disease  heart disease or recent heart attack  high blood pressure  high cholesterol or triglycerides  kidney disease  liver disease  migraine headaches  seizures  stroke  tobacco smoker  an unusual or allergic reaction to etonogestrel, anesthetics or antiseptics, other medicines, foods, dyes, or preservatives  pregnant or trying to get pregnant  breast-feeding How should I use this medicine? This device is inserted just under the skin on the inner side of your upper arm by a health care professional. Talk to your pediatrician regarding the use of this medicine in children. Special care may be needed. Overdosage: If you think you have taken too much of this medicine contact a poison control center or emergency room at once. NOTE: This medicine is only for you. Do not share this medicine with  others. What if I miss a dose? This does not apply. What may interact with this medicine? Do not take this medicine with any of the following medications:  amprenavir  fosamprenavir This medicine may also interact with the following medications:  acitretin  aprepitant  armodafinil  bexarotene  bosentan  carbamazepine  certain medicines for fungal infections like fluconazole, ketoconazole, itraconazole and voriconazole  certain medicines to treat hepatitis, HIV or AIDS  cyclosporine  felbamate  griseofulvin  lamotrigine  modafinil  oxcarbazepine  phenobarbital  phenytoin  primidone  rifabutin  rifampin  rifapentine  St. John's wort  topiramate This list may not describe all possible interactions. Give your health care provider a list of all the medicines, herbs, non-prescription drugs, or dietary supplements you use. Also tell them if you smoke, drink alcohol, or use illegal drugs. Some items may interact with your medicine. What should I watch for while using this medicine? This product does not protect you against HIV infection (AIDS) or other sexually transmitted diseases. You should be able to feel the implant by pressing your fingertips over the skin where it was inserted. Contact your doctor if you cannot feel the implant, and use a non-hormonal birth control method (such as condoms) until your doctor confirms that the implant is in place. Contact your doctor if you think that the implant may have broken or become bent while in your arm. You will receive a user card from your health care provider after the implant is inserted. The card is a record of the location of the implant in your upper arm and when it should be removed. Keep this card with your health records. What side effects may I notice from receiving this medicine? Side effects that you should report to your doctor or health care professional as soon as possible:  allergic reactions like  skin rash, itching or hives, swelling of the face, lips, or tongue  breast lumps, breast tissue changes, or discharge  breathing problems  changes in emotions or moods  if you feel that the implant may have broken or bent while in your arm  high blood pressure  pain, irritation, swelling, or bruising at the insertion site  scar at site of insertion  signs of infection at the insertion site such as fever, and skin redness, pain or discharge  signs and symptoms of a blood clot such as breathing problems; changes in vision; chest pain; severe, sudden headache; pain, swelling, warmth in the leg; trouble speaking; sudden numbness or weakness of the face, arm or leg  signs and symptoms of liver injury like dark yellow or brown urine; general ill feeling or flu-like symptoms; light-colored stools; loss of appetite; nausea; right  upper belly pain; unusually weak or tired; yellowing of the eyes or skin  unusual vaginal bleeding, discharge Side effects that usually do not require medical attention (report to your doctor or health care professional if they continue or are bothersome):  acne  breast pain or tenderness  headache  irregular menstrual bleeding  nausea This list may not describe all possible side effects. Call your doctor for medical advice about side effects. You may report side effects to FDA at 1-800-FDA-1088. Where should I keep my medicine? This drug is given in a hospital or clinic and will not be stored at home. NOTE: This sheet is a summary. It may not cover all possible information. If you have questions about this medicine, talk to your doctor, pharmacist, or health care provider.  2020 Elsevier/Gold Standard (2017-01-28 14:11:42)

## 2018-10-02 DIAGNOSIS — F331 Major depressive disorder, recurrent, moderate: Secondary | ICD-10-CM | POA: Diagnosis not present

## 2018-10-05 DIAGNOSIS — F331 Major depressive disorder, recurrent, moderate: Secondary | ICD-10-CM | POA: Diagnosis not present

## 2018-10-07 ENCOUNTER — Ambulatory Visit (INDEPENDENT_AMBULATORY_CARE_PROVIDER_SITE_OTHER): Payer: BC Managed Care – PPO | Admitting: Bariatrics

## 2018-10-07 ENCOUNTER — Other Ambulatory Visit: Payer: Self-pay

## 2018-10-07 ENCOUNTER — Encounter (INDEPENDENT_AMBULATORY_CARE_PROVIDER_SITE_OTHER): Payer: Self-pay | Admitting: Bariatrics

## 2018-10-07 VITALS — BP 119/81 | HR 99 | Temp 99.1°F | Ht 69.0 in | Wt 254.0 lb

## 2018-10-07 DIAGNOSIS — Z6836 Body mass index (BMI) 36.0-36.9, adult: Secondary | ICD-10-CM

## 2018-10-07 DIAGNOSIS — E8881 Metabolic syndrome: Secondary | ICD-10-CM | POA: Diagnosis not present

## 2018-10-07 DIAGNOSIS — Z9189 Other specified personal risk factors, not elsewhere classified: Secondary | ICD-10-CM | POA: Diagnosis not present

## 2018-10-07 DIAGNOSIS — I10 Essential (primary) hypertension: Secondary | ICD-10-CM | POA: Diagnosis not present

## 2018-10-07 DIAGNOSIS — F331 Major depressive disorder, recurrent, moderate: Secondary | ICD-10-CM | POA: Diagnosis not present

## 2018-10-07 MED ORDER — METFORMIN HCL 500 MG PO TABS: 500.0000 mg | ORAL_TABLET | Freq: Two times a day (BID) | ORAL | 0 refills | Status: DC

## 2018-10-08 ENCOUNTER — Encounter (INDEPENDENT_AMBULATORY_CARE_PROVIDER_SITE_OTHER): Payer: Self-pay | Admitting: Bariatrics

## 2018-10-08 NOTE — Progress Notes (Signed)
Office: [phone]  /  Fax: [phone]   HPI:   Chief Complaint: OBESITY Shannon [name] is here to discuss her progress with her obesity treatment plan. She is on the keep a food journal with 1400 to 1500 calories and 90 grams of protein daily plan and is following her eating plan approximately 50 % of the time. She states she is walking 20 minutes 3 times per week. Shannon [name] is down 7 pounds. She has been drinking her water, and she is getting enough protein. Her weight is 254 lb (115.2 kg) today and has had a weight loss of 7 pounds over a period of 2 weeks since her last visit. She has lost 16 lbs since starting treatment with [place].  Hypertension Shannon [name] is a 28 y.o. female with hypertension.  Shannon [name] denies lightheadedness. She is working weight loss to help control her blood [name] with the goal of decreasing her risk of heart attack and stroke. Shannon [name] is well controlled.  Insulin Resistance Shannon [name] has a diagnosis of insulin resistance based on her elevated fasting insulin level >5. Although Shannon [name]'s blood glucose readings are still under good control, insulin resistance puts her at greater risk of metabolic syndrome and diabetes. Her last A1c was at 5.3 and last insulin level was at 11.9 She is taking metformin currently and continues to work on diet and exercise to decrease risk of diabetes.  At risk for diabetes Shannon [name] is at higher than average risk for developing diabetes due to her obesity and insulin resistance. She currently denies polyuria or polydipsia.  ASSESSMENT AND PLAN:  Essential hypertension  Insulin resistance - Plan: metFORMIN (GLUCOPHAGE) 500 MG tablet  At risk for diabetes mellitus  Class 2 severe obesity with serious comorbidity and body mass index (BMI) of 36.0 to 36.9 in adult, unspecified obesity type ([place])  PLAN:  Hypertension We discussed sodium restriction, working on healthy weight loss, and a regular exercise program as the  means to achieve improved blood [name] control. Shannon [name] with this plan and [name] to follow up as directed. We will continue to monitor her blood [name] as well as her progress with the above lifestyle modifications. She will continue her medications as prescribed and will watch for signs of hypotension as she continues her lifestyle modifications.  Insulin Resistance Shannon [name] will continue to work on weight loss, exercise, and decreasing simple carbohydrates in her diet to help decrease the risk of diabetes. We dicussed metformin including benefits and risks. She was informed that eating too many simple carbohydrates or too many calories at one sitting increases the likelihood of GI side effects. Shannon [name] agrees to continue metformin 500 mg BID #60 with no refills and follow up with [place] as directed to monitor her progress.  Diabetes risk counseling Shannon [name] was given extended (15 minutes) diabetes prevention counseling today. She is 28 y.o. female and has risk factors for diabetes including obesity and insulin resistance. We discussed intensive lifestyle modifications today with an emphasis on weight loss as well as increasing exercise and decreasing simple carbohydrates in her diet.  Obesity Shannon [name] is currently in the action stage of change. As such, her goal is to continue with weight loss efforts She has [name] to keep a food journal with 1400 to 1500 calories and 90 grams of protein daily Shannon [name] has been instructed to work up to a goal of 150 minutes of combined cardio and strengthening exercise per week for weight loss and overall health benefits. We discussed the following Behavioral  Modification Strategies today: increase H2O intake, no skipping meals, keeping healthy foods in the home, increasing lean protein intake, decreasing simple carbohydrates, increasing vegetables, decrease eating out and work on meal planning and intentional eating  Shannon [name] has [name] to follow up with our clinic in  2 to 3 weeks fasting. She was informed of the importance of frequent follow up visits to maximize her success with intensive lifestyle modifications for her multiple health conditions.  ALLERGIES: Allergies  Allergen Reactions  . Bupropion Other (See Comments)    Reaction:  Constipation, states just the extended release    MEDICATIONS: Current Outpatient Medications on File Prior to Visit  Medication Sig Dispense Refill  . buPROPion (WELLBUTRIN SR) 150 MG 12 hr tablet Take 150 mg by mouth 2 (two) times daily.    [name] losartan (COZAAR) 25 MG tablet Take 1 tablet (25 mg total) by mouth daily. 90 tablet 2  . norethindrone (ORTHO MICRONOR) 0.35 MG tablet Take 1 tablet (0.35 mg total) by mouth daily. 3 Package 4  . Vitamin D, Ergocalciferol, 50 MCG (2000 UT) CAPS Take 2,000 Units by mouth daily. 30 capsule 0   No current facility-administered medications on file prior to visit.     PAST MEDICAL HISTORY: Past Medical History:  Diagnosis Date  . Anemia   . Anxiety   . Constipation   . Depression   . Dyspnea   . HTN (hypertension)   . Knee pain   . Leg edema   . Medical history non-contributory   . Obesity   . Pain in both feet   . Prolonged QT interval   . Pulmonary thromboembolism ([place])   . Suicide ideation     PAST SURGICAL HISTORY: Past Surgical History:  Procedure Laterality Date  . NO PAST SURGERIES    . TEETH REMOVAL  07/2017    SOCIAL HISTORY: Social History   Tobacco Use  . Smoking status: Never Smoker  . Smokeless tobacco: Never Used  Substance Use Topics  . Alcohol use: No  . Drug use: No    FAMILY HISTORY: Family History  Problem Relation Age of Onset  . Healthy Mother   . Hypertension Mother   . Obesity Mother   . Healthy Father   . Pulmonary embolism Neg Hx   . Deep vein thrombosis Neg Hx     ROS: Review of Systems  Constitutional: Positive for weight loss.  Genitourinary: Negative for frequency.  Neurological:       Negative for  lightheadedness  Endo/Heme/Allergies: Negative for polydipsia.    PHYSICAL EXAM: Blood [name] 119/81, pulse 99, temperature 99.1 F (37.3 C), temperature source Oral, height 5\' 9"  (1.753 m), weight 254 lb (115.2 kg), last menstrual period 09/10/2018, SpO2 100 %. Body mass index is 37.51 kg/m. Physical Exam Vitals signs reviewed.  Constitutional:      Appearance: Normal appearance. She is well-developed. She is obese.  Cardiovascular:     Rate and Rhythm: Normal rate.  Pulmonary:     Effort: Pulmonary effort is normal.  Musculoskeletal: Normal range of motion.  Skin:    General: Skin is warm and dry.  Neurological:     Mental Status: She is alert and oriented to person, place, and time.  Psychiatric:        Mood and Affect: Mood normal.        Behavior: Behavior normal.     RECENT LABS AND TESTS: BMET    Component Value Date/Time   NA 140 04/22/2018 0915  K 4.4 04/22/2018 0915   CL 105 04/22/2018 0915   CO2 22 04/22/2018 0915   GLUCOSE 84 04/22/2018 0915   GLUCOSE 98 10/15/2017 1635   BUN 11 04/22/2018 0915   CREATININE 1.00 04/22/2018 0915   CALCIUM 9.4 04/22/2018 0915   GFRNONAA 77 04/22/2018 0915   GFRAA 89 04/22/2018 0915   Lab Results  Component Value Date   HGBA1C 5.3 04/22/2018   HGBA1C 5.5 12/29/2017   HGBA1C 5.8 08/07/2017   Lab Results  Component Value Date   INSULIN 11.9 04/22/2018   INSULIN 21.8 12/29/2017   CBC    Component Value Date/Time   WBC 5.0 04/22/2018 0915   WBC 5.7 08/07/2017 0938   RBC 4.70 04/22/2018 0915   RBC 4.90 08/07/2017 0938   HGB 11.9 04/22/2018 0915   HCT 37.9 04/22/2018 0915   PLT 354.0 08/07/2017 0938   MCV 81 04/22/2018 0915   MCH 25.3 (L) 04/22/2018 0915   MCH 24.5 (L) 07/06/2016 0536   MCHC 31.4 (L) 04/22/2018 0915   MCHC 32.7 08/07/2017 0938   RDW 13.8 04/22/2018 0915   LYMPHSABS 2.0 04/22/2018 0915   EOSABS 0.2 04/22/2018 0915   BASOSABS 0.1 04/22/2018 0915   Iron/TIBC/Ferritin/ %Sat    Component  Value Date/Time   IRON 36 04/22/2018 0915   TIBC 340 04/22/2018 0915   FERRITIN 15 04/22/2018 0915   IRONPCTSAT 11 (L) 04/22/2018 0915   Lipid Panel     Component Value Date/Time   CHOL 193 04/22/2018 0915   TRIG 42 04/22/2018 0915   HDL 43 04/22/2018 0915   CHOLHDL 6 08/07/2017 0938   VLDL 22.0 08/07/2017 0938   LDLCALC 142 (H) 04/22/2018 0915   Hepatic Function Panel     Component Value Date/Time   PROT 7.1 04/22/2018 0915   ALBUMIN 4.4 04/22/2018 0915   AST 11 04/22/2018 0915   ALT 9 04/22/2018 0915   ALKPHOS 123 (H) 04/22/2018 0915   BILITOT 0.5 04/22/2018 0915      Component Value Date/Time   TSH 0.792 12/29/2017 1051   TSH 1.16 08/07/2017 0938   TSH 1.69 08/06/2016 0855      OBESITY BEHAVIORAL INTERVENTION VISIT  Today's visit was # 18   Starting weight: 263 lbs Starting date: 12/29/2017 Today's weight : 247 lbs Today's date: 10/07/2018 Total lbs lost to date: 16    10/07/2018  Height 5\' 9"  (1.753 m)  Weight 254 lb (115.2 kg)  BMI (Calculated) 37.49  BLOOD [name] - SYSTOLIC 419  BLOOD [name] - DIASTOLIC 81   Body Fat % 37.9 %    ASK: We discussed the diagnosis of obesity with Marcene Brawn today and Verania [name] to give [place] permission to discuss obesity behavioral modification therapy today.  ASSESS: Chayse has the diagnosis of obesity and her BMI today is 37.49 Milda is in the action stage of change   ADVISE: Aryona was educated on the multiple health risks of obesity as well as the benefit of weight loss to improve her health. She was advised of the need for long term treatment and the importance of lifestyle modifications to improve her current health and to decrease her risk of future health problems.  AGREE: Multiple dietary modification options and treatment options were discussed and  Lorenna [name] to follow the recommendations documented in the above note.  ARRANGE: Trinita was educated on the importance of frequent visits to treat  obesity as outlined per CMS and USPSTF guidelines and [name] to schedule her next  follow up appointment today.  Shannon [name], am acting as Location manager for [employer]. Owens Shark, DO  I have reviewed the above documentation for accuracy and completeness, and I agree with the above. -Jearld Lesch, DO

## 2018-10-09 DIAGNOSIS — F331 Major depressive disorder, recurrent, moderate: Secondary | ICD-10-CM | POA: Diagnosis not present

## 2018-10-12 DIAGNOSIS — F331 Major depressive disorder, recurrent, moderate: Secondary | ICD-10-CM | POA: Diagnosis not present

## 2018-10-14 DIAGNOSIS — F331 Major depressive disorder, recurrent, moderate: Secondary | ICD-10-CM | POA: Diagnosis not present

## 2018-10-16 ENCOUNTER — Ambulatory Visit: Payer: Self-pay | Admitting: Nurse Practitioner

## 2018-10-16 DIAGNOSIS — F331 Major depressive disorder, recurrent, moderate: Secondary | ICD-10-CM | POA: Diagnosis not present

## 2018-10-19 DIAGNOSIS — F331 Major depressive disorder, recurrent, moderate: Secondary | ICD-10-CM | POA: Diagnosis not present

## 2018-10-21 DIAGNOSIS — F3341 Major depressive disorder, recurrent, in partial remission: Secondary | ICD-10-CM | POA: Diagnosis not present

## 2018-10-28 DIAGNOSIS — F3341 Major depressive disorder, recurrent, in partial remission: Secondary | ICD-10-CM | POA: Diagnosis not present

## 2018-11-02 ENCOUNTER — Ambulatory Visit (INDEPENDENT_AMBULATORY_CARE_PROVIDER_SITE_OTHER): Payer: BC Managed Care – PPO | Admitting: Bariatrics

## 2018-11-02 ENCOUNTER — Encounter (INDEPENDENT_AMBULATORY_CARE_PROVIDER_SITE_OTHER): Payer: Self-pay | Admitting: Bariatrics

## 2018-11-02 ENCOUNTER — Other Ambulatory Visit: Payer: Self-pay

## 2018-11-02 VITALS — BP 141/96 | HR 78 | Temp 98.7°F | Ht 69.0 in | Wt 247.0 lb

## 2018-11-02 DIAGNOSIS — E8881 Metabolic syndrome: Secondary | ICD-10-CM | POA: Diagnosis not present

## 2018-11-02 DIAGNOSIS — E559 Vitamin D deficiency, unspecified: Secondary | ICD-10-CM

## 2018-11-02 DIAGNOSIS — Z6836 Body mass index (BMI) 36.0-36.9, adult: Secondary | ICD-10-CM

## 2018-11-02 DIAGNOSIS — Z9189 Other specified personal risk factors, not elsewhere classified: Secondary | ICD-10-CM | POA: Diagnosis not present

## 2018-11-03 LAB — COMPREHENSIVE METABOLIC PANEL
ALT: 12 IU/L (ref 0–32)
AST: 16 IU/L (ref 0–40)
Albumin/Globulin Ratio: 1.6 (ref 1.2–2.2)
Albumin: 4.4 g/dL (ref 3.9–5.0)
Alkaline Phosphatase: 102 IU/L (ref 39–117)
BUN/Creatinine Ratio: 8 — ABNORMAL LOW (ref 9–23)
BUN: 8 mg/dL (ref 6–20)
Bilirubin Total: 0.6 mg/dL (ref 0.0–1.2)
CO2: 21 mmol/L (ref 20–29)
Calcium: 9 mg/dL (ref 8.7–10.2)
Chloride: 102 mmol/L (ref 96–106)
Creatinine, Ser: 1.01 mg/dL — ABNORMAL HIGH (ref 0.57–1.00)
GFR calc Af Amer: 88 mL/min/{1.73_m2} (ref >59)
GFR calc non Af Amer: 76 mL/min/{1.73_m2} (ref >59)
Globulin, Total: 2.8 g/dL (ref 1.5–4.5)
Glucose: 79 mg/dL (ref 65–99)
Potassium: 4.3 mmol/L (ref 3.5–5.2)
Sodium: 139 mmol/L (ref 134–144)
Total Protein: 7.2 g/dL (ref 6.0–8.5)

## 2018-11-03 LAB — LIPID PANEL WITH LDL/HDL RATIO
Cholesterol, Total: 207 mg/dL — ABNORMAL HIGH (ref 100–199)
HDL: 40 mg/dL (ref >39)
LDL Calculated: 157 mg/dL — ABNORMAL HIGH (ref 0–99)
LDl/HDL Ratio: 3.9 ratio — ABNORMAL HIGH (ref 0.0–3.2)
Triglycerides: 50 mg/dL (ref 0–149)
VLDL Cholesterol Cal: 10 mg/dL (ref 5–40)

## 2018-11-03 LAB — VITAMIN D 25 HYDROXY (VIT D DEFICIENCY, FRACTURES): Vit D, 25-Hydroxy: 45.9 ng/mL (ref 30.0–100.0)

## 2018-11-03 LAB — INSULIN, RANDOM: INSULIN: 6.2 u[IU]/mL (ref 2.6–24.9)

## 2018-11-03 LAB — HEMOGLOBIN A1C
Est. average glucose Bld gHb Est-mCnc: 105 mg/dL
Hgb A1c MFr Bld: 5.3 % (ref 4.8–5.6)

## 2018-11-03 NOTE — Progress Notes (Signed)
Office: [phone]  /  Fax: [phone]   HPI:   Chief Complaint: OBESITY Shannon [name] is here to discuss her progress with her obesity treatment plan. She is on the keep a food journal with 1400 to 1500 calories and 90 grams of protein daily plan and she is following her eating plan approximately 25 % of the time. She states she is walking 30 minutes 2 times per week. Shannon [name] is not eating junk food and she is getting better at drinking water. Her weight is 247 lb (112 kg) today and has had a weight loss of 7 pounds over a period of 4 weeks since her last visit. She has lost 16 lbs since starting treatment with [place].  Insulin Resistance Shannon [name] has a diagnosis of insulin resistance based on her elevated fasting insulin level >5. Although Shannon [name]'s blood glucose readings are still under good control, insulin resistance puts her at greater risk of metabolic syndrome and diabetes. Her appetite is normal. Last A1c was at 5.3 and last insulin level was at 11.9 Shannon [name] is taking metformin currently and she continues to work on diet and exercise to decrease risk of diabetes.  At risk for diabetes Shannon [name] is at higher than average risk for developing diabetes due to her obesity and insulin resistance. She currently denies polyuria or polydipsia.  Vitamin D deficiency Shannon [name] has a diagnosis of vitamin D deficiency. Shannon [name] is currently taking OTC vit D and she denies nausea, vomiting or muscle weakness.  ASSESSMENT AND PLAN:  Insulin resistance - Plan: Comprehensive metabolic panel, Hemoglobin A1c, Insulin, random, Lipid Panel With LDL/HDL Ratio, metFORMIN (GLUCOPHAGE) 500 MG tablet  Vitamin D deficiency - Plan: VITAMIN D 25 Hydroxy (Vit-D Deficiency, Fractures)  At risk for diabetes mellitus  Class 2 severe obesity with serious comorbidity and body mass index (BMI) of 36.0 to 36.9 in adult, unspecified obesity type ([place])  PLAN:  Insulin Resistance Shannon [name] will continue to work on weight loss,  exercise, and decreasing simple carbohydrates in her diet to help decrease the risk of diabetes. We dicussed metformin including benefits and risks. She was informed that eating too many simple carbohydrates or too many calories at one sitting increases the likelihood of GI side effects. Shannon [name] agrees to continue metformin 500 mg and change to daily from twice daily #30 with no refills and follow up with [place] as directed to monitor her progress.  Diabetes risk counseling Shannon [name] was given extended (15 minutes) diabetes prevention counseling today. She is 28 y.o. female and has risk factors for diabetes including obesity and insulin resistance. We discussed intensive lifestyle modifications today with an emphasis on weight loss as well as increasing exercise and decreasing simple carbohydrates in her diet.  Vitamin D Deficiency Shannon [name] was informed that low vitamin D levels contributes to fatigue and are associated with obesity, breast, and colon cancer. Shannon [name] will continue to take OTC vitamin D and she will follow up for routine testing of vitamin D, at least 2-3 times per year. She was informed of the risk of over-replacement of vitamin D and agrees to not increase her dose unless she discusses this with [place] first. We will check vitamin D level and Shannon [name] agrees to follow up as directed.  Obesity Shannon [name] is currently in the action stage of change. As such, her goal is to continue with weight loss efforts She has agreed to keep a food journal with 1400 to 1500 calories and 90 grams of protein daily Shannon [name] will continue her exercise regimen for weight  loss and overall health benefits. We discussed the following Behavioral Modification Strategies today: increase H2O intake, keeping healthy foods in the home, increasing lean protein intake, decreasing simple carbohydrates, increasing vegetables, decrease eating out and work on meal planning and intentional eating  Shannon [name] has agreed to follow up with our  clinic in 2 weeks. She was informed of the importance of frequent follow up visits to maximize her success with intensive lifestyle modifications for her multiple health conditions.  ALLERGIES: Allergies  Allergen Reactions  . Bupropion Other (See Comments)    Reaction:  Constipation, states just the extended release    MEDICATIONS: Current Outpatient Medications on File Prior to Visit  Medication Sig Dispense Refill  . buPROPion (WELLBUTRIN SR) 150 MG 12 hr tablet Take 150 mg by mouth 2 (two) times daily.    [name] losartan (COZAAR) 25 MG tablet Take 1 tablet (25 mg total) by mouth daily. 90 tablet 2  . metFORMIN (GLUCOPHAGE) 500 MG tablet Take 1 tablet (500 mg total) by mouth 2 (two) times daily with a meal. 60 tablet 0  . norethindrone (ORTHO MICRONOR) 0.35 MG tablet Take 1 tablet (0.35 mg total) by mouth daily. 3 Package 4  . Vitamin D, Ergocalciferol, 50 MCG (2000 UT) CAPS Take 2,000 Units by mouth daily. 30 capsule 0   No current facility-administered medications on file prior to visit.     PAST MEDICAL HISTORY: Past Medical History:  Diagnosis Date  . Anemia   . Anxiety   . Constipation   . Depression   . Dyspnea   . HTN (hypertension)   . Knee pain   . Leg edema   . Medical history non-contributory   . Obesity   . Pain in both feet   . Prolonged QT interval   . Pulmonary thromboembolism ([place])   . Suicide ideation     PAST SURGICAL HISTORY: Past Surgical History:  Procedure Laterality Date  . NO PAST SURGERIES    . TEETH REMOVAL  07/2017    SOCIAL HISTORY: Social History   Tobacco Use  . Smoking status: Never Smoker  . Smokeless tobacco: Never Used  Substance Use Topics  . Alcohol use: No  . Drug use: No    FAMILY HISTORY: Family History  Problem Relation Age of Onset  . Healthy Mother   . Hypertension Mother   . Obesity Mother   . Healthy Father   . Pulmonary embolism Neg Hx   . Deep vein thrombosis Neg Hx     ROS: Review of Systems   Constitutional: Positive for weight loss.  Gastrointestinal: Negative for nausea and vomiting.  Genitourinary: Negative for frequency.  Musculoskeletal:       Negative for muscle weakness  Endo/Heme/Allergies: Negative for polydipsia.       Negative for polyphagia    PHYSICAL EXAM: Blood pressure (!) 141/96, pulse 78, temperature 98.7 F (37.1 C), temperature source Oral, height 5\' 9"  (1.753 m), weight 247 lb (112 kg), SpO2 100 %. Body mass index is 36.48 kg/m. Physical Exam Vitals signs reviewed.  Constitutional:      Appearance: Normal appearance. She is well-developed. She is obese.  Cardiovascular:     Rate and Rhythm: Normal rate.  Pulmonary:     Effort: Pulmonary effort is normal.  Musculoskeletal: Normal range of motion.  Skin:    General: Skin is warm and dry.  Neurological:     Mental Status: She is alert and oriented to person, place, and time.  Psychiatric:  Mood and Affect: Mood normal.        Behavior: Behavior normal.     RECENT LABS AND TESTS: BMET    Component Value Date/Time   NA 139 11/02/2018 0923   K 4.3 11/02/2018 0923   CL 102 11/02/2018 0923   CO2 21 11/02/2018 0923   GLUCOSE 79 11/02/2018 0923   GLUCOSE 98 10/15/2017 1635   BUN 8 11/02/2018 0923   CREATININE 1.01 (H) 11/02/2018 0923   CALCIUM 9.0 11/02/2018 0923   GFRNONAA 76 11/02/2018 0923   GFRAA 88 11/02/2018 0923   Lab Results  Component Value Date   HGBA1C 5.3 11/02/2018   HGBA1C 5.3 04/22/2018   HGBA1C 5.5 12/29/2017   HGBA1C 5.8 08/07/2017   Lab Results  Component Value Date   INSULIN 6.2 11/02/2018   INSULIN 11.9 04/22/2018   INSULIN 21.8 12/29/2017   CBC    Component Value Date/Time   WBC 5.0 04/22/2018 0915   WBC 5.7 08/07/2017 0938   RBC 4.70 04/22/2018 0915   RBC 4.90 08/07/2017 0938   HGB 11.9 04/22/2018 0915   HCT 37.9 04/22/2018 0915   PLT 354.0 08/07/2017 0938   MCV 81 04/22/2018 0915   MCH 25.3 (L) 04/22/2018 0915   MCH 24.5 (L) 07/06/2016 0536    MCHC 31.4 (L) 04/22/2018 0915   MCHC 32.7 08/07/2017 0938   RDW 13.8 04/22/2018 0915   LYMPHSABS 2.0 04/22/2018 0915   EOSABS 0.2 04/22/2018 0915   BASOSABS 0.1 04/22/2018 0915   Iron/TIBC/Ferritin/ %Sat    Component Value Date/Time   IRON 36 04/22/2018 0915   TIBC 340 04/22/2018 0915   FERRITIN 15 04/22/2018 0915   IRONPCTSAT 11 (L) 04/22/2018 0915   Lipid Panel     Component Value Date/Time   CHOL 207 (H) 11/02/2018 0923   TRIG 50 11/02/2018 0923   HDL 40 11/02/2018 0923   CHOLHDL 6 08/07/2017 0938   VLDL 22.0 08/07/2017 0938   LDLCALC 157 (H) 11/02/2018 0923   Hepatic Function Panel     Component Value Date/Time   PROT 7.2 11/02/2018 0923   ALBUMIN 4.4 11/02/2018 0923   AST 16 11/02/2018 0923   ALT 12 11/02/2018 0923   ALKPHOS 102 11/02/2018 0923   BILITOT 0.6 11/02/2018 0923      Component Value Date/Time   TSH 0.792 12/29/2017 1051   TSH 1.16 08/07/2017 0938   TSH 1.69 08/06/2016 0855     Ref. Range 04/22/2018 09:15  Vitamin D, 25-Hydroxy Latest Ref Range: 30.0 - 100.0 ng/mL 57.2    OBESITY BEHAVIORAL INTERVENTION VISIT  Today's visit was # 19   Starting weight: 263 lbs Starting date: 12/29/2017 Today's weight : 247 lbs Today's date: 11/02/2018 Total lbs lost to date: 16    11/02/2018  Height 5\' 9"  (1.753 m)  Weight 247 lb (112 kg)  BMI (Calculated) 36.46  BLOOD PRESSURE - SYSTOLIC 814  BLOOD PRESSURE - DIASTOLIC 96   Body Fat % 48.1 %  Total Body Water (lbs) 88.8 lbs    ASK: We discussed the diagnosis of obesity with Shannon [name] today and Shannon [name] agreed to give [place] permission to discuss obesity behavioral modification therapy today.  ASSESS: Shannon [name] has the diagnosis of obesity and her BMI today is 36.46 [place] is in the action stage of change   ADVISE: Shannon [name] was educated on the multiple health risks of obesity as well as the benefit of weight loss to improve her health. She was advised of the need for long  term treatment and the  importance of lifestyle modifications to improve her current health and to decrease her risk of future health problems.  AGREE: Multiple dietary modification options and treatment options were discussed and  Shannon [name] agreed to follow the recommendations documented in the above note.  ARRANGE: Shannon [name] was educated on the importance of frequent visits to treat obesity as outlined per CMS and USPSTF guidelines and agreed to schedule her next follow up appointment today.  Shannon [name], am acting as Location manager for [employer]. Owens Shark, DO  I have reviewed the above documentation for accuracy and completeness, and I agree with the above. -Jearld Lesch, DO

## 2018-11-04 ENCOUNTER — Encounter (INDEPENDENT_AMBULATORY_CARE_PROVIDER_SITE_OTHER): Payer: Self-pay | Admitting: Bariatrics

## 2018-11-04 DIAGNOSIS — F332 Major depressive disorder, recurrent severe without psychotic features: Secondary | ICD-10-CM | POA: Diagnosis not present

## 2018-11-04 DIAGNOSIS — F3341 Major depressive disorder, recurrent, in partial remission: Secondary | ICD-10-CM | POA: Diagnosis not present

## 2018-11-04 MED ORDER — METFORMIN HCL 500 MG PO TABS: 500.0000 mg | ORAL_TABLET | Freq: Every day | ORAL | 0 refills | Status: DC

## 2018-11-11 DIAGNOSIS — F3341 Major depressive disorder, recurrent, in partial remission: Secondary | ICD-10-CM | POA: Diagnosis not present

## 2018-11-16 ENCOUNTER — Other Ambulatory Visit: Payer: Self-pay

## 2018-11-16 ENCOUNTER — Ambulatory Visit (INDEPENDENT_AMBULATORY_CARE_PROVIDER_SITE_OTHER): Payer: BC Managed Care – PPO | Admitting: Family Medicine

## 2018-11-16 VITALS — BP 125/70 | HR 90 | Temp 97.9°F | Ht 69.0 in | Wt 243.4 lb

## 2018-11-16 DIAGNOSIS — Z9189 Other specified personal risk factors, not elsewhere classified: Secondary | ICD-10-CM

## 2018-11-16 DIAGNOSIS — E559 Vitamin D deficiency, unspecified: Secondary | ICD-10-CM | POA: Diagnosis not present

## 2018-11-16 DIAGNOSIS — Z6835 Body mass index (BMI) 35.0-35.9, adult: Secondary | ICD-10-CM

## 2018-11-16 DIAGNOSIS — F418 Other specified anxiety disorders: Secondary | ICD-10-CM | POA: Diagnosis not present

## 2018-11-16 MED ORDER — BUPROPION HCL ER (SR) 150 MG PO TB12: 150.0000 mg | ORAL_TABLET | Freq: Two times a day (BID) | ORAL | 0 refills | Status: DC

## 2018-11-16 NOTE — Progress Notes (Signed)
Office: [phone]  /  Fax: [phone]   HPI:   Chief Complaint: OBESITY Shannon [name] is here to discuss her progress with her obesity treatment plan. She is on the Category 3 plan and journaling 1300-1400 calories + 85-95 grams of protein and is following her eating plan approximately 50% of the time. She states she is walking 60 minutes 3 times per week. Shannon [name] reports she has no cravings for sweets since she started bupropion. She tends to eat out too much because she doesn't have room in the kitchen because of her roommate.  Her weight is 243 lb 6.4 oz (110.4 kg) today and has had a weight loss of 4 pounds over a period of 2 weeks since her last visit. She has lost 20 lbs since starting treatment with [place].  Depression with Anxiety Shannon [name] is struggling with depression and is seeing a counselor 1 time per week and going to a support group 2 times per week.  Depression screen [facility] 2/9 01/12/2018 12/29/2017 08/07/2017 08/06/2016  Decreased Interest 1 1 2  0  Down, Depressed, Hopeless 1 3 0 0  PHQ - 2 Score 2 4 2  0  Altered sleeping 0 2 0 0  Tired, decreased energy 2 2 2 1   Change in appetite 2 3 2  0  Feeling bad or failure about yourself  1 3 0 0  Trouble concentrating 2 3 0 0  Moving slowly or fidgety/restless 0 2 0 0  Suicidal thoughts 1 2 0 0  PHQ-9 Score 10 21 6 1   Difficult doing work/chores - Somewhat difficult Not difficult at all -   Vitamin D deficiency Shannon [name] has a diagnosis of Vitamin D deficiency. Her Vitamin D level is slightly down on 2,000 OTC daily - 45.9 on 11/02/2018, down from 57.2 on 04/22/2018. She denies nausea, vomiting or muscle weakness.  At risk for cardiovascular disease Shannon [name] is at a higher than average risk for cardiovascular disease due to obesity. She currently denies any chest pain.  ASSESSMENT AND PLAN:  Vitamin D deficiency  Depression with anxiety - Plan: buPROPion (WELLBUTRIN SR) 150 MG 12 hr tablet  At risk for heart disease  Class 2 severe  obesity with serious comorbidity and body mass index (BMI) of 35.0 to 35.9 in adult, unspecified obesity type ([place])  PLAN:  Depression with Anxiety We discussed behavior modification techniques today to help Shannon [name] deal with her depression and anxiety. Shannon [name] was given a refill on her bupropion SR 150 mg BID #60 with 0 refills and she agrees to follow-up with our clinic in 2 weeks. She will continue to see her counselor and psychiatrist.  Vitamin D Deficiency Shannon [name] was informed that low Vitamin D levels contributes to fatigue and are associated with obesity, breast, and colon cancer. She agrees to increase her dose of OTC Vit D to 4,000 IU daily and will follow-up for routine testing of Vitamin D, at least 2-3 times per year. She was informed of the risk of over-replacement of Vitamin D and agrees to not increase her dose unless she discusses this with [place] first. Shannon [name] agrees to follow-up with our clinic in 2 weeks.  Cardiovascular risk counseling Shannon [name] was given extended (15 minutes) coronary artery disease prevention counseling today. She is 28 y.o. female and has risk factors for heart disease including obesity. We discussed intensive lifestyle modifications today with an emphasis on specific weight loss instructions and strategies. Pt was also informed of the importance of increasing exercise and decreasing saturated fats to help  prevent heart disease.  Obesity Shannon [name] is currently in the action stage of change. As such, her goal is to continue with weight loss efforts. She has agreed to follow the Category 3 plan and journal 1300-1400 calories + 85-90 grams of protein. Shannon [name] has been instructed to continue her current exercise regimen for weight loss and overall health benefits. We discussed the following Behavioral Modification Strategies today: emotional eating strategies and keep a strict food journal. Shannon [name] was instructed to journal when eating out.  Shannon [name] has agreed to follow-up  with our clinic in 2 weeks. She was informed of the importance of frequent follow-up visits to maximize her success with intensive lifestyle modifications for her multiple health conditions.  ALLERGIES: Allergies  Allergen Reactions  . Bupropion Other (See Comments)    Reaction:  Constipation, states just the extended release    MEDICATIONS: Current Outpatient Medications on File Prior to Visit  Medication Sig Dispense Refill  . losartan (COZAAR) 25 MG tablet Take 1 tablet (25 mg total) by mouth daily. 90 tablet 2  . metFORMIN (GLUCOPHAGE) 500 MG tablet Take 1 tablet (500 mg total) by mouth daily with breakfast. 30 tablet 0  . norethindrone (ORTHO MICRONOR) 0.35 MG tablet Take 1 tablet (0.35 mg total) by mouth daily. 3 Package 4  . Vitamin D, Ergocalciferol, 50 MCG (2000 UT) CAPS Take 2,000 Units by mouth daily. 30 capsule 0   No current facility-administered medications on file prior to visit.     PAST MEDICAL HISTORY: Past Medical History:  Diagnosis Date  . Anemia   . Anxiety   . Constipation   . Depression   . Dyspnea   . HTN (hypertension)   . Knee pain   . Leg edema   . Medical history non-contributory   . Obesity   . Pain in both feet   . Prolonged QT interval   . Pulmonary thromboembolism ([place])   . Suicide ideation     PAST SURGICAL HISTORY: Past Surgical History:  Procedure Laterality Date  . NO PAST SURGERIES    . TEETH REMOVAL  07/2017    SOCIAL HISTORY: Social History   Tobacco Use  . Smoking status: Never Smoker  . Smokeless tobacco: Never Used  Substance Use Topics  . Alcohol use: No  . Drug use: No    FAMILY HISTORY: Family History  Problem Relation Age of Onset  . Healthy Mother   . Hypertension Mother   . Obesity Mother   . Healthy Father   . Pulmonary embolism Neg Hx   . Deep vein thrombosis Neg Hx    ROS: Review of Systems  Gastrointestinal: Negative for nausea and vomiting.  Musculoskeletal:       Negative for muscle weakness.   Psychiatric/Behavioral: Positive for depression. The patient is nervous/anxious.    PHYSICAL EXAM: Blood pressure 125/70, pulse 90, temperature 97.9 F (36.6 C), temperature source Oral, height 5\' 9"  (1.753 m), weight 243 lb 6.4 oz (110.4 kg), last menstrual period 11/01/2018, SpO2 100 %. Body mass index is 35.94 kg/m. Physical Exam Vitals signs reviewed.  Constitutional:      Appearance: Normal appearance. She is obese.  Cardiovascular:     Rate and Rhythm: Normal rate.     Pulses: Normal pulses.  Pulmonary:     Effort: Pulmonary effort is normal.     Breath sounds: Normal breath sounds.  Musculoskeletal: Normal range of motion.  Skin:    General: Skin is warm and dry.  Neurological:  Mental Status: She is alert and oriented to person, place, and time.  Psychiatric:        Behavior: Behavior normal.   RECENT LABS AND TESTS: BMET    Component Value Date/Time   NA 139 11/02/2018 0923   K 4.3 11/02/2018 0923   CL 102 11/02/2018 0923   CO2 21 11/02/2018 0923   GLUCOSE 79 11/02/2018 0923   GLUCOSE 98 10/15/2017 1635   BUN 8 11/02/2018 0923   CREATININE 1.01 (H) 11/02/2018 0923   CALCIUM 9.0 11/02/2018 0923   GFRNONAA 76 11/02/2018 0923   GFRAA 88 11/02/2018 0923   Lab Results  Component Value Date   HGBA1C 5.3 11/02/2018   HGBA1C 5.3 04/22/2018   HGBA1C 5.5 12/29/2017   HGBA1C 5.8 08/07/2017   Lab Results  Component Value Date   INSULIN 6.2 11/02/2018   INSULIN 11.9 04/22/2018   INSULIN 21.8 12/29/2017   CBC    Component Value Date/Time   WBC 5.0 04/22/2018 0915   WBC 5.7 08/07/2017 0938   RBC 4.70 04/22/2018 0915   RBC 4.90 08/07/2017 0938   HGB 11.9 04/22/2018 0915   HCT 37.9 04/22/2018 0915   PLT 354.0 08/07/2017 0938   MCV 81 04/22/2018 0915   MCH 25.3 (L) 04/22/2018 0915   MCH 24.5 (L) 07/06/2016 0536   MCHC 31.4 (L) 04/22/2018 0915   MCHC 32.7 08/07/2017 0938   RDW 13.8 04/22/2018 0915   LYMPHSABS 2.0 04/22/2018 0915   EOSABS 0.2  04/22/2018 0915   BASOSABS 0.1 04/22/2018 0915   Iron/TIBC/Ferritin/ %Sat    Component Value Date/Time   IRON 36 04/22/2018 0915   TIBC 340 04/22/2018 0915   FERRITIN 15 04/22/2018 0915   IRONPCTSAT 11 (L) 04/22/2018 0915   Lipid Panel     Component Value Date/Time   CHOL 207 (H) 11/02/2018 0923   TRIG 50 11/02/2018 0923   HDL 40 11/02/2018 0923   CHOLHDL 6 08/07/2017 0938   VLDL 22.0 08/07/2017 0938   LDLCALC 157 (H) 11/02/2018 0923   Hepatic Function Panel     Component Value Date/Time   PROT 7.2 11/02/2018 0923   ALBUMIN 4.4 11/02/2018 0923   AST 16 11/02/2018 0923   ALT 12 11/02/2018 0923   ALKPHOS 102 11/02/2018 0923   BILITOT 0.6 11/02/2018 0923      Component Value Date/Time   TSH 0.792 12/29/2017 1051   TSH 1.16 08/07/2017 0938   TSH 1.69 08/06/2016 0855   Results for Shannon, [name] (MRN [id number]) as of 11/16/2018 13:48  Ref. Range 11/02/2018 09:23  Vitamin D, 25-Hydroxy Latest Ref Range: 30.0 - 100.0 ng/mL 45.9   OBESITY BEHAVIORAL INTERVENTION VISIT  Today's visit was #20  Starting weight: 263 lbs Starting date: 12/29/2017 Today's weight: 243 lbs  Today's date: 11/16/2018 Total lbs lost to date: 20   11/16/2018  Height 5\' 9"  (1.753 m)  Weight 243 lb 6.4 oz (110.4 kg)  BMI (Calculated) 35.93  BLOOD PRESSURE - SYSTOLIC 0000000  BLOOD PRESSURE - DIASTOLIC 70   Body Fat % [id number] %  Total Body Water (lbs) 88.2 lbs   ASK: We discussed the diagnosis of obesity with Marcene Brawn today and Wana agreed to give [place] permission to discuss obesity behavioral modification therapy today.  ASSESS: Mlissa has the diagnosis of obesity and her BMI today is 35.9. [place] is in the action stage of change   ADVISE: Kristye was educated on the multiple health risks of obesity as well as the benefit of weight  loss to improve her health. She was advised of the need for long term treatment and the importance of lifestyle modifications to improve her current health and to  decrease her risk of future health problems.  AGREE: Multiple dietary modification options and treatment options were discussed and  Shantavia agreed to follow the recommendations documented in the above note.  ARRANGE: Kamrey was educated on the importance of frequent visits to treat obesity as outlined per CMS and USPSTF guidelines and agreed to schedule her next follow up appointment today.  IMichaelene Song, am acting as Location manager for [employer], FNP  I have reviewed the above documentation for accuracy and completeness, and I agree with the above.  - Katisha Shimizu, FNP-C.

## 2018-11-18 ENCOUNTER — Encounter (INDEPENDENT_AMBULATORY_CARE_PROVIDER_SITE_OTHER): Payer: Self-pay | Admitting: Family Medicine

## 2018-11-18 DIAGNOSIS — F3341 Major depressive disorder, recurrent, in partial remission: Secondary | ICD-10-CM | POA: Diagnosis not present

## 2018-11-25 DIAGNOSIS — F3341 Major depressive disorder, recurrent, in partial remission: Secondary | ICD-10-CM | POA: Diagnosis not present

## 2018-11-26 ENCOUNTER — Ambulatory Visit (INDEPENDENT_AMBULATORY_CARE_PROVIDER_SITE_OTHER): Payer: BC Managed Care – PPO | Admitting: Family Medicine

## 2018-11-26 ENCOUNTER — Other Ambulatory Visit: Payer: Self-pay

## 2018-11-26 VITALS — BP 121/86 | HR 94 | Temp 98.4°F | Ht 69.0 in | Wt 240.2 lb

## 2018-11-26 DIAGNOSIS — Z6835 Body mass index (BMI) 35.0-35.9, adult: Secondary | ICD-10-CM

## 2018-11-26 DIAGNOSIS — Z9189 Other specified personal risk factors, not elsewhere classified: Secondary | ICD-10-CM | POA: Diagnosis not present

## 2018-11-26 DIAGNOSIS — E88819 Insulin resistance, unspecified: Secondary | ICD-10-CM

## 2018-11-26 DIAGNOSIS — E8881 Metabolic syndrome: Secondary | ICD-10-CM | POA: Diagnosis not present

## 2018-11-26 DIAGNOSIS — F418 Other specified anxiety disorders: Secondary | ICD-10-CM | POA: Diagnosis not present

## 2018-11-26 MED ORDER — METFORMIN HCL 500 MG PO TABS: 500.0000 mg | ORAL_TABLET | Freq: Every day | ORAL | 0 refills | Status: DC

## 2018-12-01 NOTE — Progress Notes (Signed)
Office: [phone]  /  Fax: [phone]   HPI:   Chief Complaint: OBESITY Shannon [name] is here to discuss her progress with her obesity treatment plan. She is on the keep a food journal with 1300 to 1400 calories and 85 to 90 grams of protein daily plan and she is following her eating plan approximately 15 % of the time. She states she is walking 60 minutes 3 times per week. [name] has decreased eating out. She is not eating candy or junk food which has been an issue for her in the past.  She does not always meet her protein goal.   Shannon [name] is not journaling consistently. Her weight is 240 lb 3.2 oz (109 kg) today and has had a weight loss of 3 pounds over a period of 2 weeks since her last visit. She has lost 23 lbs since starting treatment with [place].  Insulin Resistance Shannon [name] has a diagnosis of insulin resistance based on her elevated fasting insulin level >5. Although Shannon [name]'s blood glucose readings are still under good control, insulin resistance puts her at greater risk of metabolic syndrome and diabetes. She is taking metformin currently and continues to work on diet and exercise to decrease risk of diabetes. Shannon [name] denies polyphagia. Lab Results  Component Value Date   HGBA1C 5.3 11/02/2018    At risk for diabetes Shannon [name] is at higher than average risk for developing diabetes due to her obesity and insulin resistance. She currently denies polyuria or polydipsia.  Depression with anxiety Shannon [name] has more energy. She denies depression. Shannon [name] feels extra sunlight from walking outdoors helps. She feels her anxiety has increased recently. Shannon [name] would like a new psychiatric provider. Her current provider won't prescribe medications.  She is seeing her counselor regularly and  attending support groups.She shows no sign of suicidal or homicidal ideations.  ASSESSMENT AND PLAN:  Insulin resistance - Plan: metFORMIN (GLUCOPHAGE) 500 MG tablet  Depression with anxiety - Plan: Ambulatory  referral to Psychiatry  At risk for diabetes mellitus  Class 2 severe obesity with serious comorbidity and body mass index (BMI) of 35.0 to 35.9 in adult, unspecified obesity type ([place])  PLAN:  Insulin Resistance Shannon [name] will continue to work on weight loss, exercise, and decreasing simple carbohydrates in her diet to help decrease the risk of diabetes. We dicussed metformin including benefits and risks. She was informed that eating too many simple carbohydrates or too many calories at one sitting increases the likelihood of GI side effects. Shannon [name] agrees to continue metformin 500 mg qAM #30 with no refills and follow up with [place] as directed to monitor her progress.  Diabetes risk counseling Shannon [name] was given extended (15 minutes) diabetes prevention counseling today. She is 28 y.o. female and has risk factors for diabetes including obesity and insulin resistance. We discussed intensive lifestyle modifications today with an emphasis on weight loss as well as increasing exercise and decreasing simple carbohydrates in her diet.  Depression with anxiety We will refer patient to psychiatry. She will continue to see her counselor and continue to attend support groups. Shannon [name] agrees to follow up with our clinic in 2 weeks.  Obesity Shannon [name] is currently in the action stage of change. As such, her goal is to continue with weight loss efforts She has agreed to follow the Category 3 plan Shannon [name] will continue her exercise regimen for weight loss and overall health benefits. We discussed the following Behavioral Modification Strategies today: increasing lean protein intake, planning for success and keep a strict food  journal.  Shannon [name] has agreed to follow up with our clinic in 2 weeks. She was informed of the importance of frequent follow up visits to maximize her success with intensive lifestyle modifications for her multiple health conditions.  ALLERGIES: Allergies  Allergen Reactions  . Bupropion  Other (See Comments)    Reaction:  Constipation, states just the extended release    MEDICATIONS: Current Outpatient Medications on File Prior to Visit  Medication Sig Dispense Refill  . buPROPion (WELLBUTRIN SR) 150 MG 12 hr tablet Take 1 tablet (150 mg total) by mouth 2 (two) times daily. 60 tablet 0  . losartan (COZAAR) 25 MG tablet Take 1 tablet (25 mg total) by mouth daily. 90 tablet 2  . norethindrone (ORTHO MICRONOR) 0.35 MG tablet Take 1 tablet (0.35 mg total) by mouth daily. 3 Package 4  . Vitamin D, Ergocalciferol, 50 MCG (2000 UT) CAPS Take 2,000 Units by mouth daily. 30 capsule 0   No current facility-administered medications on file prior to visit.     PAST MEDICAL HISTORY: Past Medical History:  Diagnosis Date  . Anemia   . Anxiety   . Constipation   . Depression   . Dyspnea   . HTN (hypertension)   . Knee pain   . Leg edema   . Medical history non-contributory   . Obesity   . Pain in both feet   . Prolonged QT interval   . Pulmonary thromboembolism ([place])   . Suicide ideation     PAST SURGICAL HISTORY: Past Surgical History:  Procedure Laterality Date  . NO PAST SURGERIES    . TEETH REMOVAL  07/2017    SOCIAL HISTORY: Social History   Tobacco Use  . Smoking status: Never Smoker  . Smokeless tobacco: Never Used  Substance Use Topics  . Alcohol use: No  . Drug use: No    FAMILY HISTORY: Family History  Problem Relation Age of Onset  . Healthy Mother   . Hypertension Mother   . Obesity Mother   . Healthy Father   . Pulmonary embolism Neg Hx   . Deep vein thrombosis Neg Hx     ROS: Review of Systems  Constitutional: Positive for weight loss.  Genitourinary: Negative for frequency.  Endo/Heme/Allergies: Negative for polydipsia.       Negative for polyphagia  Psychiatric/Behavioral: Positive for depression. Negative for suicidal ideas.    PHYSICAL EXAM: Blood pressure 121/86, pulse 94, temperature 98.4 F (36.9 C), temperature source  Oral, height 5\' 9"  (1.753 m), weight 240 lb 3.2 oz (109 kg), last menstrual period 11/01/2018, SpO2 100 %. Body mass index is 35.47 kg/m. Physical Exam Vitals signs reviewed.  Constitutional:      Appearance: Normal appearance. She is well-developed. She is obese.  Cardiovascular:     Rate and Rhythm: Normal rate.  Pulmonary:     Effort: Pulmonary effort is normal.  Musculoskeletal: Normal range of motion.  Skin:    General: Skin is warm and dry.  Neurological:     Mental Status: She is alert and oriented to person, place, and time.  Psychiatric:        Mood and Affect: Mood normal.        Behavior: Behavior normal.        Thought Content: Thought content does not include homicidal or suicidal ideation.     RECENT LABS AND TESTS: BMET    Component Value Date/Time   NA 139 11/02/2018 0923   K 4.3 11/02/2018 [id number]  CL 102 11/02/2018 0923   CO2 21 11/02/2018 0923   GLUCOSE 79 11/02/2018 0923   GLUCOSE 98 10/15/2017 1635   BUN 8 11/02/2018 0923   CREATININE 1.01 (H) 11/02/2018 0923   CALCIUM 9.0 11/02/2018 0923   GFRNONAA 76 11/02/2018 0923   GFRAA 88 11/02/2018 0923   Lab Results  Component Value Date   HGBA1C 5.3 11/02/2018   HGBA1C 5.3 04/22/2018   HGBA1C 5.5 12/29/2017   HGBA1C 5.8 08/07/2017   Lab Results  Component Value Date   INSULIN 6.2 11/02/2018   INSULIN 11.9 04/22/2018   INSULIN 21.8 12/29/2017   CBC    Component Value Date/Time   WBC 5.0 04/22/2018 0915   WBC 5.7 08/07/2017 0938   RBC 4.70 04/22/2018 0915   RBC 4.90 08/07/2017 0938   HGB 11.9 04/22/2018 0915   HCT 37.9 04/22/2018 0915   PLT 354.0 08/07/2017 0938   MCV 81 04/22/2018 0915   MCH 25.3 (L) 04/22/2018 0915   MCH 24.5 (L) 07/06/2016 0536   MCHC 31.4 (L) 04/22/2018 0915   MCHC 32.7 08/07/2017 0938   RDW 13.8 04/22/2018 0915   LYMPHSABS 2.0 04/22/2018 0915   EOSABS 0.2 04/22/2018 0915   BASOSABS 0.1 04/22/2018 0915   Iron/TIBC/Ferritin/ %Sat    Component Value Date/Time    IRON 36 04/22/2018 0915   TIBC 340 04/22/2018 0915   FERRITIN 15 04/22/2018 0915   IRONPCTSAT 11 (L) 04/22/2018 0915   Lipid Panel     Component Value Date/Time   CHOL 207 (H) 11/02/2018 0923   TRIG 50 11/02/2018 0923   HDL 40 11/02/2018 0923   CHOLHDL 6 08/07/2017 0938   VLDL 22.0 08/07/2017 0938   LDLCALC 157 (H) 11/02/2018 0923   Hepatic Function Panel     Component Value Date/Time   PROT 7.2 11/02/2018 0923   ALBUMIN 4.4 11/02/2018 0923   AST 16 11/02/2018 0923   ALT 12 11/02/2018 0923   ALKPHOS 102 11/02/2018 0923   BILITOT 0.6 11/02/2018 0923      Component Value Date/Time   TSH 0.792 12/29/2017 1051   TSH 1.16 08/07/2017 0938   TSH 1.69 08/06/2016 0855     Ref. Range 11/02/2018 09:23  Vitamin D, 25-Hydroxy Latest Ref Range: 30.0 - 100.0 ng/mL 45.9    OBESITY BEHAVIORAL INTERVENTION VISIT  Today's visit was # 21   Starting weight: 263 lbs Starting date: 12/29/2017 Today's weight : 240 lbs Today's date: 11/26/2018 Total lbs lost to date: 23    11/26/2018  Height 5\' 9"  (1.753 m)  Weight 240 lb 3.2 oz (109 kg)  BMI (Calculated) 35.46  BLOOD PRESSURE - SYSTOLIC [id number]  BLOOD PRESSURE - DIASTOLIC 86   Body Fat % [id number] %  Total Body Water (lbs) 88.8 lbs    ASK: We discussed the diagnosis of obesity with Marcene Brawn today and Monasia agreed to give [place] permission to discuss obesity behavioral modification therapy today.  ASSESS: Kaylenn has the diagnosis of obesity and her BMI today is 35.46 Charlottie is in the action stage of change   ADVISE: Aubrei was educated on the multiple health risks of obesity as well as the benefit of weight loss to improve her health. She was advised of the need for long term treatment and the importance of lifestyle modifications to improve her current health and to decrease her risk of future health problems.  AGREE: Multiple dietary modification options and treatment options were discussed and  Sherrica agreed to follow the  recommendations  documented in the above note.  ARRANGE: Vickii was educated on the importance of frequent visits to treat obesity as outlined per CMS and USPSTF guidelines and agreed to schedule her next follow up appointment today.  I, Doreene Nest, am acting as transcriptionist for [employer], FNP-C  I have reviewed the above documentation for accuracy and completeness, and I agree with the above.  - Jarelle Ates, FNP-C.

## 2018-12-02 ENCOUNTER — Encounter (INDEPENDENT_AMBULATORY_CARE_PROVIDER_SITE_OTHER): Payer: Self-pay | Admitting: Family Medicine

## 2018-12-02 DIAGNOSIS — F3341 Major depressive disorder, recurrent, in partial remission: Secondary | ICD-10-CM | POA: Diagnosis not present

## 2018-12-09 DIAGNOSIS — F3341 Major depressive disorder, recurrent, in partial remission: Secondary | ICD-10-CM | POA: Diagnosis not present

## 2018-12-10 ENCOUNTER — Other Ambulatory Visit: Payer: Self-pay

## 2018-12-10 ENCOUNTER — Ambulatory Visit (INDEPENDENT_AMBULATORY_CARE_PROVIDER_SITE_OTHER): Payer: BC Managed Care – PPO | Admitting: Family Medicine

## 2018-12-10 VITALS — BP 125/83 | HR 102 | Temp 98.0°F | Ht 69.0 in | Wt 238.4 lb

## 2018-12-10 DIAGNOSIS — E66812 Obesity, class 2: Secondary | ICD-10-CM

## 2018-12-10 DIAGNOSIS — E8881 Metabolic syndrome: Secondary | ICD-10-CM

## 2018-12-10 DIAGNOSIS — Z9189 Other specified personal risk factors, not elsewhere classified: Secondary | ICD-10-CM

## 2018-12-10 DIAGNOSIS — E88819 Insulin resistance, unspecified: Secondary | ICD-10-CM

## 2018-12-10 DIAGNOSIS — F418 Other specified anxiety disorders: Secondary | ICD-10-CM | POA: Diagnosis not present

## 2018-12-10 DIAGNOSIS — Z6835 Body mass index (BMI) 35.0-35.9, adult: Secondary | ICD-10-CM

## 2018-12-10 MED ORDER — BUPROPION HCL ER (SR) 150 MG PO TB12: 150.0000 mg | ORAL_TABLET | Freq: Two times a day (BID) | ORAL | 0 refills | Status: DC

## 2018-12-16 DIAGNOSIS — F3341 Major depressive disorder, recurrent, in partial remission: Secondary | ICD-10-CM | POA: Diagnosis not present

## 2018-12-17 ENCOUNTER — Encounter (INDEPENDENT_AMBULATORY_CARE_PROVIDER_SITE_OTHER): Payer: Self-pay | Admitting: Family Medicine

## 2018-12-17 NOTE — Progress Notes (Addendum)
Office: [phone]  /  Fax: [phone]   HPI:   Chief Complaint: OBESITY Shannon [name] is here to discuss her progress with her obesity treatment plan. She is on the Category 3 plan and journaling 1300-1400 calories and 85-90 grams of protein and is following her eating plan approximately 50% of the time. She states she is walking 90 minutes 3 times per week. Shannon [name] states she is journaling every day and meeting her goals 50% of the time. She reports she does worse when she is feeling stressed/angry at work. Her boss often does not give her a meal break though taking one himself. She is cooking more.  Her weight is 238 lb 6.4 oz (108.1 kg) today and has had a weight loss of 2 pounds over a period of 3 weeks since her last visit. She has lost 25 lbs since starting treatment with [place].  Depression with Anxiety Shannon [name] is struggling with emotional eating and using food for comfort to the extent that it is negatively impacting her health. She often snacks when she is not hungry. Shannon [name] sometimes feels she is out of control and then feels guilty that she made poor food choices. She has been working on behavior modification techniques to help reduce her emotional eating and has been somewhat successful. Shannon [name] is seeing a counselor weekly and has gone to 3 support groups. She is on bupropion with no side effects and feels this helps with cravings. She was referred to Psychiatry at her last visit but has not heard from [facility]. She shows no sign of suicidal or homicidal ideations.  Depression screen [facility] 2/9 01/12/2018 12/29/2017 08/07/2017 08/06/2016  Decreased Interest 1 1 2  0  Down, Depressed, Hopeless 1 3 0 0  PHQ - 2 Score 2 4 2  0  Altered sleeping 0 2 0 0  Tired, decreased energy 2 2 2 1   Change in appetite 2 3 2  0  Feeling bad or failure about yourself  1 3 0 0  Trouble concentrating 2 3 0 0  Moving slowly or fidgety/restless 0 2 0 0  Suicidal thoughts 1 2 0 0  PHQ-9 Score 10 21 6 1   Difficult doing  work/chores - Somewhat difficult Not difficult at all -   Insulin Resistance Taquanda has a diagnosis of insulin resistance based on her elevated fasting insulin level >5. Although Shermeka's blood glucose readings are still under good control, insulin resistance puts her at greater risk of metabolic syndrome and diabetes. She is taking metformin currently and continues to work on diet and exercise to decrease risk of diabetes. No polyphagia.  At risk for diabetes Darissa is at higher than average risk for developing diabetes due to her obesity. She currently denies polyuria or polydipsia.  ASSESSMENT AND PLAN:  Insulin resistance  Depression with anxiety - Plan: buPROPion (WELLBUTRIN SR) 150 MG 12 hr tablet, Ambulatory referral to Psychiatry  At risk for diabetes mellitus  Class 2 severe obesity with serious comorbidity and body mass index (BMI) of 35.0 to 35.9 in adult, unspecified obesity type ([place])  PLAN:  Depression with Anxiety We discussed behavior modification techniques today to help Shannon [name] deal with her emotional eating and depression. [name] was given a refill on her bupropion 150 mg BID #60 with 0 refills and she agrees to follow-up with our clinic in 2 weeks. We will check on the status of the Psychiatry referral.  Insulin Resistance [name] will continue to work on weight loss, exercise, and decreasing simple carbohydrates in her diet  to help decrease the risk of diabetes. Shannon [name] will continue metformin and follow-up with [place] as directed to monitor her progress.  Diabetes risk counseling Shannon [name] was given extended (15 minutes) diabetes prevention counseling today. She is 28 y.o. female and has risk factors for diabetes including obesity. We discussed intensive lifestyle modifications today with an emphasis on weight loss as well as increasing exercise and decreasing simple carbohydrates in her diet.  Obesity Shannon [name] is currently in the action stage of change. As such, her goal is  to continue with weight loss efforts. She has agreed to follow the Category 3 plan or journal 1300-1400 calories and 85-90 grams of protein. Shannon [name] has been instructed to continue her current exercise regimen for weight loss and overall health benefits. We discussed the following Behavioral Modification Strategies today: increasing lean protein intake, planning for success, and keep a strict food journal.  Shannon [name] has agreed to follow-up with our clinic in 2 weeks. She was informed of the importance of frequent follow-up visits to maximize her success with intensive lifestyle modifications for her multiple health conditions.  ALLERGIES: Allergies  Allergen Reactions  . Bupropion Other (See Comments)    Reaction:  Constipation, states just the extended release    MEDICATIONS: Current Outpatient Medications on File Prior to Visit  Medication Sig Dispense Refill  . losartan (COZAAR) 25 MG tablet Take 1 tablet (25 mg total) by mouth daily. 90 tablet 2  . metFORMIN (GLUCOPHAGE) 500 MG tablet Take 1 tablet (500 mg total) by mouth daily with breakfast. 30 tablet 0  . norethindrone (ORTHO MICRONOR) 0.35 MG tablet Take 1 tablet (0.35 mg total) by mouth daily. 3 Package 4  . Vitamin D, Ergocalciferol, 50 MCG (2000 UT) CAPS Take 2,000 Units by mouth daily. 30 capsule 0   No current facility-administered medications on file prior to visit.     PAST MEDICAL HISTORY: Past Medical History:  Diagnosis Date  . Anemia   . Anxiety   . Constipation   . Depression   . Dyspnea   . HTN (hypertension)   . Knee pain   . Leg edema   . Medical history non-contributory   . Obesity   . Pain in both feet   . Prolonged QT interval   . Pulmonary thromboembolism ([place])   . Suicide ideation     PAST SURGICAL HISTORY: Past Surgical History:  Procedure Laterality Date  . NO PAST SURGERIES    . TEETH REMOVAL  07/2017    SOCIAL HISTORY: Social History   Tobacco Use  . Smoking status: Never Smoker  .  Smokeless tobacco: Never Used  Substance Use Topics  . Alcohol use: No  . Drug use: No    FAMILY HISTORY: Family History  Problem Relation Age of Onset  . Healthy Mother   . Hypertension Mother   . Obesity Mother   . Healthy Father   . Pulmonary embolism Neg Hx   . Deep vein thrombosis Neg Hx    ROS: Review of Systems  Endo/Heme/Allergies:       Negative for polyphagia.  Psychiatric/Behavioral: Positive for depression. Negative for suicidal ideas. The patient is nervous/anxious.        Negative for homicidal ideas.   PHYSICAL EXAM: Blood pressure 125/83, pulse (!) 102, temperature 98 F (36.7 C), temperature source Oral, height 5\' 9"  (1.753 m), weight 238 lb 6.4 oz (108.1 kg), last menstrual period 11/24/2018, SpO2 99 %. Body mass index is 35.21 kg/m. Physical Exam Vitals signs reviewed.  Constitutional:      Appearance: Normal appearance. She is obese.  Cardiovascular:     Rate and Rhythm: Normal rate.     Pulses: Normal pulses.  Pulmonary:     Effort: Pulmonary effort is normal.     Breath sounds: Normal breath sounds.  Musculoskeletal: Normal range of motion.  Skin:    General: Skin is warm and dry.  Neurological:     Mental Status: She is alert and oriented to person, place, and time.  Psychiatric:        Behavior: Behavior normal.   RECENT LABS AND TESTS: BMET    Component Value Date/Time   NA 139 11/02/2018 0923   K 4.3 11/02/2018 0923   CL 102 11/02/2018 0923   CO2 21 11/02/2018 0923   GLUCOSE 79 11/02/2018 0923   GLUCOSE 98 10/15/2017 1635   BUN 8 11/02/2018 0923   CREATININE 1.01 (H) 11/02/2018 0923   CALCIUM 9.0 11/02/2018 0923   GFRNONAA 76 11/02/2018 0923   GFRAA 88 11/02/2018 0923   Lab Results  Component Value Date   HGBA1C 5.3 11/02/2018   HGBA1C 5.3 04/22/2018   HGBA1C 5.5 12/29/2017   HGBA1C 5.8 08/07/2017   Lab Results  Component Value Date   INSULIN 6.2 11/02/2018   INSULIN 11.9 04/22/2018   INSULIN 21.8 12/29/2017   CBC     Component Value Date/Time   WBC 5.0 04/22/2018 0915   WBC 5.7 08/07/2017 0938   RBC 4.70 04/22/2018 0915   RBC 4.90 08/07/2017 0938   HGB 11.9 04/22/2018 0915   HCT 37.9 04/22/2018 0915   PLT 354.0 08/07/2017 0938   MCV 81 04/22/2018 0915   MCH 25.3 (L) 04/22/2018 0915   MCH 24.5 (L) 07/06/2016 0536   MCHC 31.4 (L) 04/22/2018 0915   MCHC 32.7 08/07/2017 0938   RDW 13.8 04/22/2018 0915   LYMPHSABS 2.0 04/22/2018 0915   EOSABS 0.2 04/22/2018 0915   BASOSABS 0.1 04/22/2018 0915   Iron/TIBC/Ferritin/ %Sat    Component Value Date/Time   IRON 36 04/22/2018 0915   TIBC 340 04/22/2018 0915   FERRITIN 15 04/22/2018 0915   IRONPCTSAT 11 (L) 04/22/2018 0915   Lipid Panel     Component Value Date/Time   CHOL 207 (H) 11/02/2018 0923   TRIG 50 11/02/2018 0923   HDL 40 11/02/2018 0923   CHOLHDL 6 08/07/2017 0938   VLDL 22.0 08/07/2017 0938   LDLCALC 157 (H) 11/02/2018 0923   Hepatic Function Panel     Component Value Date/Time   PROT 7.2 11/02/2018 0923   ALBUMIN 4.4 11/02/2018 0923   AST 16 11/02/2018 0923   ALT 12 11/02/2018 0923   ALKPHOS 102 11/02/2018 0923   BILITOT 0.6 11/02/2018 0923      Component Value Date/Time   TSH 0.792 12/29/2017 1051   TSH 1.16 08/07/2017 0938   TSH 1.69 08/06/2016 0855   Results for ELMER, ALESSI (MRN [id number]) as of 12/17/2018 10:07  Ref. Range 11/02/2018 09:23  Vitamin D, 25-Hydroxy Latest Ref Range: 30.0 - 100.0 ng/mL 45.9   OBESITY BEHAVIORAL INTERVENTION VISIT  Today's visit was #22   Starting weight: 263 lbs Starting date: 12/29/2017 Today's weight: 238 lbs  Today's date: 12/10/2018 Total lbs lost to date: 25    12/10/2018  Height 5\' 9"  (1.753 m)  Weight 238 lb 6.4 oz (108.1 kg)  BMI (Calculated) 35.19  BLOOD PRESSURE - SYSTOLIC 0000000  BLOOD PRESSURE - DIASTOLIC 83   Body Fat % 0000000 %  Total Body Water (lbs) 86.6 lbs   ASK: We discussed the diagnosis of obesity with Marcene Brawn today and Javeah agreed to give [place]  permission to discuss obesity behavioral modification therapy today.  ASSESS: Corren has the diagnosis of obesity and her BMI today is 35.2. Kimberleigh is in the action stage of change.   ADVISE: Kelsi was educated on the multiple health risks of obesity as well as the benefit of weight loss to improve her health. She was advised of the need for long term treatment and the importance of lifestyle modifications to improve her current health and to decrease her risk of future health problems.  AGREE: Multiple dietary modification options and treatment options were discussed and  Aryiah agreed to follow the recommendations documented in the above note.  ARRANGE: Clare was educated on the importance of frequent visits to treat obesity as outlined per CMS and USPSTF guidelines and agreed to schedule her next follow up appointment today.  IMichaelene Song, am acting as Location manager for [employer], FNP  I have reviewed the above documentation for accuracy and completeness, and I agree with the above.  - Avrey Flanagin, FNP-C.

## 2018-12-21 ENCOUNTER — Telehealth (HOSPITAL_COMMUNITY): Payer: Self-pay | Admitting: Professional

## 2018-12-23 DIAGNOSIS — F3341 Major depressive disorder, recurrent, in partial remission: Secondary | ICD-10-CM | POA: Diagnosis not present

## 2018-12-24 ENCOUNTER — Other Ambulatory Visit: Payer: Self-pay

## 2018-12-24 ENCOUNTER — Ambulatory Visit (INDEPENDENT_AMBULATORY_CARE_PROVIDER_SITE_OTHER): Payer: BC Managed Care – PPO | Admitting: Family Medicine

## 2018-12-24 VITALS — BP 122/88 | HR 95 | Temp 98.4°F | Ht 69.0 in | Wt 236.0 lb

## 2018-12-24 DIAGNOSIS — E669 Obesity, unspecified: Secondary | ICD-10-CM

## 2018-12-24 DIAGNOSIS — E88819 Insulin resistance, unspecified: Secondary | ICD-10-CM

## 2018-12-24 DIAGNOSIS — E8881 Metabolic syndrome: Secondary | ICD-10-CM

## 2018-12-24 DIAGNOSIS — F418 Other specified anxiety disorders: Secondary | ICD-10-CM | POA: Diagnosis not present

## 2018-12-24 DIAGNOSIS — Z9189 Other specified personal risk factors, not elsewhere classified: Secondary | ICD-10-CM | POA: Diagnosis not present

## 2018-12-24 DIAGNOSIS — Z6834 Body mass index (BMI) 34.0-34.9, adult: Secondary | ICD-10-CM

## 2018-12-24 MED ORDER — METFORMIN HCL 500 MG PO TABS: 500.0000 mg | ORAL_TABLET | Freq: Every day | ORAL | 0 refills | Status: DC

## 2018-12-28 NOTE — Progress Notes (Signed)
Office: [phone]  /  Fax: [phone]   HPI:   Chief Complaint: OBESITY Shannon [name] is here to discuss her progress with her obesity treatment plan. She is on the keep a food journal with 1300 to 1400 calories and 85 to 90 grams of protein daily plan and is following her eating plan approximately 50 % of the time. She states she is walking 120 minutes 2 times per week. Shannon [name] is journaling 3 days a week. She has been off her plan and has done some eating out. Shannon [name] is not getting enough protein. She skips dinner quite often. She sometimes skips meals at work because she does not always get a lunch break.  Shannon [name] has not been eating sweets. Her weight is 236 lb (107 kg) today and she has had a weight loss of 2 pounds over a period of 2 weeks since her last visit. She has lost 27 lbs since starting treatment with [place].  Insulin Resistance Shannon [name] has a diagnosis of insulin resistance based on her elevated fasting insulin level >5. Although Shannon [name]'s blood glucose readings are still under good control, insulin resistance puts her at greater risk of metabolic syndrome and diabetes. She is taking metformin currently and continues to work on diet and exercise to decrease risk of diabetes. Shannon [name] denies polyphagia. Lab Results  Component Value Date   HGBA1C 5.3 11/02/2018    At risk for diabetes Shannon [name] is at higher than average risk for developing diabetes due to her obesity and insulin resistance. She currently denies polyuria or polydipsia.  Depression with anxiety- with emotional eating behaviors Shannon [name] is still attending support meetings three times a week. She feels somewhat depressed due to being harassed by her boss at work. She is going to contact HR about this. She also has increased anxiety over the past few weeks related to this situation. Shannon [name] is stable on Bupropion. She was contacted by behavioral health to set up an appointment. Shannon [name] struggles with emotional eating and using food  for comfort to the extent that it is negatively impacting her health. She often snacks when she is not hungry. Shannon [name] sometimes feels she is out of control and then feels guilty that she made poor food choices. She has been working on behavior modification techniques to help reduce her emotional eating and has been somewhat successful. She shows no sign of suicidal or homicidal ideations.  ASSESSMENT AND PLAN:  Insulin resistance - Plan: metFORMIN (GLUCOPHAGE) 500 MG tablet  Depression with anxiety - with emotional eating   At risk for diabetes mellitus  Class 1 obesity with serious comorbidity and body mass index (BMI) of 34.0 to 34.9 in adult, unspecified obesity type  PLAN:  Insulin Resistance Shannon [name] will continue to work on weight loss, exercise, and decreasing simple carbohydrates in her diet to help decrease the risk of diabetes. We dicussed metformin including benefits and risks. She was informed that eating too many simple carbohydrates or too many calories at one sitting increases the likelihood of GI side effects. Shannon [name] agreed to continue metformin 500 mg daily with breakfast #30 with no refills and follow up with [place] as directed to monitor her progress.  Diabetes risk counseling Shannon [name] was given extended (15 minutes) diabetes prevention counseling today. She is 28 y.o. female and has risk factors for diabetes including obesity and insulin resistance. We discussed intensive lifestyle modifications today with an emphasis on weight loss as well as increasing exercise and decreasing simple carbohydrates in her diet.  Depression with  anxiety- with Emotional Eating Behaviors We discussed behavior modification techniques today to help Shannon [name] deal with her emotional eating and depression. Shannon [name] will continue Bupropion and she will call behavioral health back, if she does not hear from them. Shannon [name] agreed to follow up as directed.  Obesity Shannon [name] is currently in the action stage of  change. As such, her goal is to continue with weight loss efforts She has agreed to keep a food journal with 1300 to 1400 calories and 85 to 90 grams of protein daily Shannon [name]  will continue walking 120 minutes 2 times per week for weight loss and overall health benefits. We discussed the following Behavioral Modification Strategies today: planning for success, keep a strict food journal, increasing lean protein intake, increasing vegetables and work on meal planning and easy cooking plans  Shannon [name] will journal 5 to 6 days per week.  Shannon [name] has agreed to follow up with our clinic in 2 weeks. She was informed of the importance of frequent follow up visits to maximize her success with intensive lifestyle modifications for her multiple health conditions.  ALLERGIES: Allergies  Allergen Reactions   Bupropion Other (See Comments)    Reaction:  Constipation, states just the extended release    MEDICATIONS: Current Outpatient Medications on File Prior to Visit  Medication Sig Dispense Refill   buPROPion (WELLBUTRIN SR) 150 MG 12 hr tablet Take 1 tablet (150 mg total) by mouth 2 (two) times daily. 60 tablet 0   losartan (COZAAR) 25 MG tablet Take 1 tablet (25 mg total) by mouth daily. 90 tablet 2   norethindrone (ORTHO MICRONOR) 0.35 MG tablet Take 1 tablet (0.35 mg total) by mouth daily. 3 Package 4   Vitamin D, Ergocalciferol, 50 MCG (2000 UT) CAPS Take 2,000 Units by mouth daily. 30 capsule 0   No current facility-administered medications on file prior to visit.     PAST MEDICAL HISTORY: Past Medical History:  Diagnosis Date   Anemia    Anxiety    Constipation    Depression    Dyspnea    HTN (hypertension)    Knee pain    Leg edema    Medical history non-contributory    Obesity    Pain in both feet    Prolonged QT interval    Pulmonary thromboembolism ([place])    Suicide ideation     PAST SURGICAL HISTORY: Past Surgical History:  Procedure Laterality Date    NO PAST SURGERIES     TEETH REMOVAL  07/2017    SOCIAL HISTORY: Social History   Tobacco Use   Smoking status: Never Smoker   Smokeless tobacco: Never Used  Substance Use Topics   Alcohol use: No   Drug use: No    FAMILY HISTORY: Family History  Problem Relation Age of Onset   Healthy Mother    Hypertension Mother    Obesity Mother    Healthy Father    Pulmonary embolism Neg Hx    Deep vein thrombosis Neg Hx     ROS: Review of Systems  Constitutional: Positive for weight loss.  Genitourinary: Negative for frequency.  Endo/Heme/Allergies: Negative for polydipsia.       Negative for polyphagia  Psychiatric/Behavioral: Positive for depression. Negative for suicidal ideas. The patient is nervous/anxious.     PHYSICAL EXAM: Blood pressure 122/88, pulse 95, temperature 98.4 F (36.9 C), temperature source Oral, height 5\' 9"  (1.753 m), weight 236 lb (107 kg), last menstrual period 11/24/2018, SpO2 99 %. Body mass index is 34.85  kg/m. Physical Exam Vitals signs reviewed.  Constitutional:      Appearance: Normal appearance. She is well-developed. She is obese.  Cardiovascular:     Rate and Rhythm: Normal rate.  Pulmonary:     Effort: Pulmonary effort is normal.  Musculoskeletal: Normal range of motion.  Skin:    General: Skin is warm and dry.  Neurological:     Mental Status: She is alert and oriented to person, place, and time.  Psychiatric:        Mood and Affect: Mood normal.        Behavior: Behavior normal.        Thought Content: Thought content does not include homicidal or suicidal ideation.     RECENT LABS AND TESTS: BMET    Component Value Date/Time   NA 139 11/02/2018 0923   K 4.3 11/02/2018 0923   CL 102 11/02/2018 0923   CO2 21 11/02/2018 0923   GLUCOSE 79 11/02/2018 0923   GLUCOSE 98 10/15/2017 1635   BUN 8 11/02/2018 0923   CREATININE 1.01 (H) 11/02/2018 0923   CALCIUM 9.0 11/02/2018 0923   GFRNONAA 76 11/02/2018 0923   GFRAA  88 11/02/2018 0923   Lab Results  Component Value Date   HGBA1C 5.3 11/02/2018   HGBA1C 5.3 04/22/2018   HGBA1C 5.5 12/29/2017   HGBA1C 5.8 08/07/2017   Lab Results  Component Value Date   INSULIN 6.2 11/02/2018   INSULIN 11.9 04/22/2018   INSULIN 21.8 12/29/2017   CBC    Component Value Date/Time   WBC 5.0 04/22/2018 0915   WBC 5.7 08/07/2017 0938   RBC 4.70 04/22/2018 0915   RBC 4.90 08/07/2017 0938   HGB 11.9 04/22/2018 0915   HCT 37.9 04/22/2018 0915   PLT 354.0 08/07/2017 0938   MCV 81 04/22/2018 0915   MCH 25.3 (L) 04/22/2018 0915   MCH 24.5 (L) 07/06/2016 0536   MCHC 31.4 (L) 04/22/2018 0915   MCHC 32.7 08/07/2017 0938   RDW 13.8 04/22/2018 0915   LYMPHSABS 2.0 04/22/2018 0915   EOSABS 0.2 04/22/2018 0915   BASOSABS 0.1 04/22/2018 0915   Iron/TIBC/Ferritin/ %Sat    Component Value Date/Time   IRON 36 04/22/2018 0915   TIBC 340 04/22/2018 0915   FERRITIN 15 04/22/2018 0915   IRONPCTSAT 11 (L) 04/22/2018 0915   Lipid Panel     Component Value Date/Time   CHOL 207 (H) 11/02/2018 0923   TRIG 50 11/02/2018 0923   HDL 40 11/02/2018 0923   CHOLHDL 6 08/07/2017 0938   VLDL 22.0 08/07/2017 0938   LDLCALC 157 (H) 11/02/2018 0923   Hepatic Function Panel     Component Value Date/Time   PROT 7.2 11/02/2018 0923   ALBUMIN 4.4 11/02/2018 0923   AST 16 11/02/2018 0923   ALT 12 11/02/2018 0923   ALKPHOS 102 11/02/2018 0923   BILITOT 0.6 11/02/2018 0923      Component Value Date/Time   TSH 0.792 12/29/2017 1051   TSH 1.16 08/07/2017 0938   TSH 1.69 08/06/2016 0855     Ref. Range 11/02/2018 09:23  Vitamin D, 25-Hydroxy Latest Ref Range: 30.0 - 100.0 ng/mL 45.9    OBESITY BEHAVIORAL INTERVENTION VISIT  Today's visit was # 23  Starting weight: 263 lbs Starting date: 12/29/2017 Today's weight : 236 lbs Today's date: 12/24/2018 Total lbs lost to date: 27    12/24/2018  Height 5\' 9"  (1.753 m)  Weight 236 lb (107 kg)  BMI (Calculated) 34.84  BLOOD  PRESSURE -  SYSTOLIC 123XX123  BLOOD PRESSURE - DIASTOLIC 88   Body Fat % 123456 %  Total Body Water (lbs) 86.4 lbs    ASK: We discussed the diagnosis of obesity with Shannon [name] today and Shannon [name] agreed to give [place] permission to discuss obesity behavioral modification therapy today.  ASSESS: Shannon [name] has the diagnosis of obesity and her BMI today is 64.84 Shannon [name] is in the action stage of change   ADVISE: Shannon [name] was educated on the multiple health risks of obesity as well as the benefit of weight loss to improve her health. She was advised of the need for long term treatment and the importance of lifestyle modifications to improve her current health and to decrease her risk of future health problems.  AGREE: Multiple dietary modification options and treatment options were discussed and  Shannon [name] agreed to follow the recommendations documented in the above note.  ARRANGE: Shannon [name] was educated on the importance of frequent visits to treat obesity as outlined per CMS and USPSTF guidelines and agreed to schedule her next follow up appointment today.  I, Doreene Nest, am acting as transcriptionist for [employer], FNP-C  I have reviewed the above documentation for accuracy and completeness, and I agree with the above.  - Allana Shrestha, FNP-C.

## 2018-12-29 ENCOUNTER — Telehealth (HOSPITAL_COMMUNITY): Payer: Self-pay | Admitting: Professional

## 2018-12-29 ENCOUNTER — Encounter (INDEPENDENT_AMBULATORY_CARE_PROVIDER_SITE_OTHER): Payer: Self-pay | Admitting: Family Medicine

## 2018-12-30 DIAGNOSIS — F3341 Major depressive disorder, recurrent, in partial remission: Secondary | ICD-10-CM | POA: Diagnosis not present

## 2019-01-06 DIAGNOSIS — F3341 Major depressive disorder, recurrent, in partial remission: Secondary | ICD-10-CM | POA: Diagnosis not present

## 2019-01-07 ENCOUNTER — Other Ambulatory Visit: Payer: Self-pay

## 2019-01-07 ENCOUNTER — Ambulatory Visit (INDEPENDENT_AMBULATORY_CARE_PROVIDER_SITE_OTHER): Payer: BC Managed Care – PPO | Admitting: Family Medicine

## 2019-01-07 VITALS — BP 121/84 | HR 84 | Temp 98.6°F | Ht 69.0 in | Wt 234.0 lb

## 2019-01-07 DIAGNOSIS — F418 Other specified anxiety disorders: Secondary | ICD-10-CM

## 2019-01-07 DIAGNOSIS — E8881 Metabolic syndrome: Secondary | ICD-10-CM | POA: Diagnosis not present

## 2019-01-07 DIAGNOSIS — Z6834 Body mass index (BMI) 34.0-34.9, adult: Secondary | ICD-10-CM

## 2019-01-07 DIAGNOSIS — E669 Obesity, unspecified: Secondary | ICD-10-CM

## 2019-01-07 DIAGNOSIS — K5909 Other constipation: Secondary | ICD-10-CM | POA: Diagnosis not present

## 2019-01-07 DIAGNOSIS — Z9189 Other specified personal risk factors, not elsewhere classified: Secondary | ICD-10-CM

## 2019-01-07 MED ORDER — BUPROPION HCL ER (SR) 150 MG PO TB12: 150.0000 mg | ORAL_TABLET | Freq: Two times a day (BID) | ORAL | 0 refills | Status: DC

## 2019-01-12 NOTE — Progress Notes (Signed)
Office: [phone]  /  Fax: [phone]   HPI:   Chief Complaint: OBESITY Shannon [name] is here to discuss her progress with her obesity treatment plan. She is on the keep a food journal with 1300 to 1400 calories and 85 to 90 grams of protein daily or the Category 3 plan and is following her eating plan approximately 50 % of the time. She states she is walking 60 minutes 2 times per week. Shannon [name] does not get a meal break at work which throws her off the plan. She has contacted human resources at work about not getting a meal break. Her weight is 234 lb (106.1 kg) today and has had a weight loss of 2 pounds over a period of 2 weeks since her last visit. She has lost 29 lbs since starting treatment with [place].  Insulin Resistance Shannon [name] has a diagnosis of insulin resistance based on her elevated fasting insulin level >5. Although Shannon [name]'s blood glucose readings are still under good control, insulin resistance puts her at greater risk of metabolic syndrome and diabetes. She is not taking metformin currently and continues to work on diet and exercise to decrease risk of diabetes. Shannon [name] admits to polyphagia when she skips meals. Lab Results  Component Value Date   HGBA1C 5.3 11/02/2018    At risk for diabetes Shannon [name] is at higher than average risk for developing diabetes due to her obesity and insulin resistance. She currently denies polyuria or polydipsia.  Constipation  Shannon [name] notes increased protein causes constipation. She denies hematochezia. She is taking Metamucil sporadically.  Depression with anxiety Shannon [name] is stressed due to work but it has not affected her eating. She denies any stress eating currently. Shannon [name] struggles with emotional eating and using food for comfort to the extent that it is negatively impacting her health. She often snacks when she is not hungry. Shannon [name] sometimes feels she is out of control and then feels guilty that she made poor food choices. She has been working on  behavior modification techniques to help reduce her emotional eating and has been somewhat successful. She shows no sign of suicidal or homicidal ideations.  ASSESSMENT AND PLAN:  Insulin resistance  Other constipation  Depression with anxiety - Plan: buPROPion (WELLBUTRIN SR) 150 MG 12 hr tablet, DISCONTINUED: buPROPion (WELLBUTRIN SR) 150 MG 12 hr tablet  At risk for diabetes mellitus  Class 1 obesity with serious comorbidity and body mass index (BMI) of 34.0 to 34.9 in adult, unspecified obesity type  PLAN:  Insulin Resistance Shannon [name] will continue to work on weight loss, exercise, and decreasing simple carbohydrates in her diet to help decrease the risk of diabetes. Shannon [name] will continue with the meal plan and follow up with [place] as directed to monitor her progress.  Diabetes risk counseling Shannon [name] was given extended (15 minutes) diabetes prevention counseling today. She is 28 y.o. female and has risk factors for diabetes including obesity and insulin resistance. We discussed intensive lifestyle modifications today with an emphasis on weight loss as well as increasing exercise and decreasing simple carbohydrates in her diet.  Constipation  Shannon [name] was informed decrease bowel movement frequency is normal while losing weight. She was advised to increase her H20 intake and work on increasing her fiber intake. High fiber foods were discussed today. Shannon [name] will take Metamucil daily and follow up as directed.  Depression with anxiety We discussed behavior modification techniques today to help Shannon [name] deal with her emotional eating and depression. She agrees to continue Bupropion (Wellbutrin SR) 150 mg  two times daily #60 with no refills and follow up as directed.  Obesity Shannon [name] is currently in the action stage of change. As such, her goal is to continue with weight loss efforts She has agreed to keep a food journal with 1300 calories and 85 to 90 grams of protein daily or follow the Category  3 plan Shannon [name] will increase walking to 3 to 4 days per week for weight loss and overall health benefits. We discussed the following Behavioral Modification Strategies today: planning for success and decrease eating out  Shannon [name] has agreed to follow up with our clinic in 2 to 3 weeks. She was informed of the importance of frequent follow up visits to maximize her success with intensive lifestyle modifications for her multiple health conditions.  ALLERGIES: Allergies  Allergen Reactions  . Bupropion Other (See Comments)    Reaction:  Constipation, states just the extended release    MEDICATIONS: Current Outpatient Medications on File Prior to Visit  Medication Sig Dispense Refill  . losartan (COZAAR) 25 MG tablet Take 1 tablet (25 mg total) by mouth daily. 90 tablet 2  . metFORMIN (GLUCOPHAGE) 500 MG tablet Take 1 tablet (500 mg total) by mouth daily with breakfast. 30 tablet 0  . norethindrone (ORTHO MICRONOR) 0.35 MG tablet Take 1 tablet (0.35 mg total) by mouth daily. 3 Package 4  . Vitamin D, Ergocalciferol, 50 MCG (2000 UT) CAPS Take 2,000 Units by mouth daily. 30 capsule 0   No current facility-administered medications on file prior to visit.     PAST MEDICAL HISTORY: Past Medical History:  Diagnosis Date  . Anemia   . Anxiety   . Constipation   . Depression   . Dyspnea   . HTN (hypertension)   . Knee pain   . Leg edema   . Medical history non-contributory   . Obesity   . Pain in both feet   . Prolonged QT interval   . Pulmonary thromboembolism ([place])   . Suicide ideation     PAST SURGICAL HISTORY: Past Surgical History:  Procedure Laterality Date  . NO PAST SURGERIES    . TEETH REMOVAL  07/2017    SOCIAL HISTORY: Social History   Tobacco Use  . Smoking status: Never Smoker  . Smokeless tobacco: Never Used  Substance Use Topics  . Alcohol use: No  . Drug use: No    FAMILY HISTORY: Family History  Problem Relation Age of Onset  . Healthy Mother   .  Hypertension Mother   . Obesity Mother   . Healthy Father   . Pulmonary embolism Neg Hx   . Deep vein thrombosis Neg Hx     ROS: Review of Systems  Constitutional: Positive for weight loss.  Gastrointestinal: Positive for constipation.       Negative for hematochezia  Genitourinary: Negative for frequency.  Endo/Heme/Allergies: Negative for polydipsia.       Positive for polyphagia  Psychiatric/Behavioral: Positive for depression. Negative for suicidal ideas. The patient is nervous/anxious.     PHYSICAL EXAM: Blood pressure 121/84, pulse 84, temperature 98.6 F (37 C), temperature source Oral, height 5\' 9"  (1.753 m), weight 234 lb (106.1 kg), SpO2 99 %. Body mass index is 34.56 kg/m. Physical Exam Vitals signs reviewed.  Constitutional:      Appearance: Normal appearance. She is well-developed. She is obese.  Cardiovascular:     Rate and Rhythm: Normal rate.  Pulmonary:     Effort: Pulmonary effort is normal.  Musculoskeletal: Normal range  of motion.  Skin:    General: Skin is warm and dry.  Neurological:     Mental Status: She is alert and oriented to person, place, and time.  Psychiatric:        Mood and Affect: Mood normal.        Behavior: Behavior normal.        Thought Content: Thought content does not include homicidal or suicidal ideation.     RECENT LABS AND TESTS: BMET    Component Value Date/Time   NA 139 11/02/2018 0923   K 4.3 11/02/2018 0923   CL 102 11/02/2018 0923   CO2 21 11/02/2018 0923   GLUCOSE 79 11/02/2018 0923   GLUCOSE 98 10/15/2017 1635   BUN 8 11/02/2018 0923   CREATININE 1.01 (H) 11/02/2018 0923   CALCIUM 9.0 11/02/2018 0923   GFRNONAA 76 11/02/2018 0923   GFRAA 88 11/02/2018 0923   Lab Results  Component Value Date   HGBA1C 5.3 11/02/2018   HGBA1C 5.3 04/22/2018   HGBA1C 5.5 12/29/2017   HGBA1C 5.8 08/07/2017   Lab Results  Component Value Date   INSULIN 6.2 11/02/2018   INSULIN 11.9 04/22/2018   INSULIN 21.8 12/29/2017    CBC    Component Value Date/Time   WBC 5.0 04/22/2018 0915   WBC 5.7 08/07/2017 0938   RBC 4.70 04/22/2018 0915   RBC 4.90 08/07/2017 0938   HGB 11.9 04/22/2018 0915   HCT 37.9 04/22/2018 0915   PLT 354.0 08/07/2017 0938   MCV 81 04/22/2018 0915   MCH 25.3 (L) 04/22/2018 0915   MCH 24.5 (L) 07/06/2016 0536   MCHC 31.4 (L) 04/22/2018 0915   MCHC 32.7 08/07/2017 0938   RDW 13.8 04/22/2018 0915   LYMPHSABS 2.0 04/22/2018 0915   EOSABS 0.2 04/22/2018 0915   BASOSABS 0.1 04/22/2018 0915   Iron/TIBC/Ferritin/ %Sat    Component Value Date/Time   IRON 36 04/22/2018 0915   TIBC 340 04/22/2018 0915   FERRITIN 15 04/22/2018 0915   IRONPCTSAT 11 (L) 04/22/2018 0915   Lipid Panel     Component Value Date/Time   CHOL 207 (H) 11/02/2018 0923   TRIG 50 11/02/2018 0923   HDL 40 11/02/2018 0923   CHOLHDL 6 08/07/2017 0938   VLDL 22.0 08/07/2017 0938   LDLCALC 157 (H) 11/02/2018 0923   Hepatic Function Panel     Component Value Date/Time   PROT 7.2 11/02/2018 0923   ALBUMIN 4.4 11/02/2018 0923   AST 16 11/02/2018 0923   ALT 12 11/02/2018 0923   ALKPHOS 102 11/02/2018 0923   BILITOT 0.6 11/02/2018 0923      Component Value Date/Time   TSH 0.792 12/29/2017 1051   TSH 1.16 08/07/2017 0938   TSH 1.69 08/06/2016 0855     Ref. Range 11/02/2018 09:23  Vitamin D, 25-Hydroxy Latest Ref Range: 30.0 - 100.0 ng/mL 45.9    OBESITY BEHAVIORAL INTERVENTION VISIT  Today's visit was # 24  Starting weight: 263 lbs Starting date: 12/29/2017 Today's weight : 234 lbs Today's date: 01/07/2019 Total lbs lost to date: 29    01/07/2019  Height 5\' 9"  (1.753 m)  Weight 234 lb (106.1 kg)  BMI (Calculated) 34.54  BLOOD PRESSURE - SYSTOLIC 123XX123  BLOOD PRESSURE - DIASTOLIC 84   Body Fat % 123456 %  Total Body Water (lbs) 86.4 lbs    ASK: We discussed the diagnosis of obesity with Shannon [name] today and Shannon [name] agreed to give [place] permission to discuss obesity behavioral modification  therapy today.  ASSESS: Shannon [name] has the diagnosis of obesity and her BMI today is 34.54 Shannon [name] is in the action stage of change   ADVISE: Shannon [name] was educated on the multiple health risks of obesity as well as the benefit of weight loss to improve her health. She was advised of the need for long term treatment and the importance of lifestyle modifications to improve her current health and to decrease her risk of future health problems.  AGREE: Multiple dietary modification options and treatment options were discussed and  Shannon [name] agreed to follow the recommendations documented in the above note.  ARRANGE: Shannon [name] was educated on the importance of frequent visits to treat obesity as outlined per CMS and USPSTF guidelines and agreed to schedule her next follow up appointment today.  Corey Skains, am acting as Location manager for [employer], FNP-C.  I have reviewed the above documentation for accuracy and completeness, and I agree with the above.  - Willard Farquharson, FNP-C.

## 2019-01-13 DIAGNOSIS — F3341 Major depressive disorder, recurrent, in partial remission: Secondary | ICD-10-CM | POA: Diagnosis not present

## 2019-01-14 ENCOUNTER — Encounter (INDEPENDENT_AMBULATORY_CARE_PROVIDER_SITE_OTHER): Payer: Self-pay | Admitting: Family Medicine

## 2019-01-20 DIAGNOSIS — F3341 Major depressive disorder, recurrent, in partial remission: Secondary | ICD-10-CM | POA: Diagnosis not present

## 2019-01-26 ENCOUNTER — Encounter (INDEPENDENT_AMBULATORY_CARE_PROVIDER_SITE_OTHER): Payer: Self-pay | Admitting: Family Medicine

## 2019-01-27 DIAGNOSIS — F3341 Major depressive disorder, recurrent, in partial remission: Secondary | ICD-10-CM | POA: Diagnosis not present

## 2019-01-28 ENCOUNTER — Ambulatory Visit (INDEPENDENT_AMBULATORY_CARE_PROVIDER_SITE_OTHER): Payer: BC Managed Care – PPO | Admitting: Family Medicine

## 2019-01-29 IMAGING — CT CT ANGIO CHEST
2 of 6 series · 18 of 36 positions shown · IV contrast (ISOVUE 370)
Comparison: Chest radiograph July 04, 2016

CLINICAL DATA: Shortness of Breath

EXAM:
CT ANGIOGRAPHY CHEST WITH CONTRAST
TECHNIQUE: Multidetector CT imaging of the chest was performed using the
standard protocol during bolus administration of intravenous
contrast. Multiplanar CT image reconstructions and MIPs were
obtained to evaluate the vascular anatomy.
CONTRAST:  100 mL Isovue 370 nonionic

[Series 6: thins for pacs · axial · 0.63mm/px · z∈[-227,-15]mm · 17 of 236 slices shown]
[im 12/236  lung]
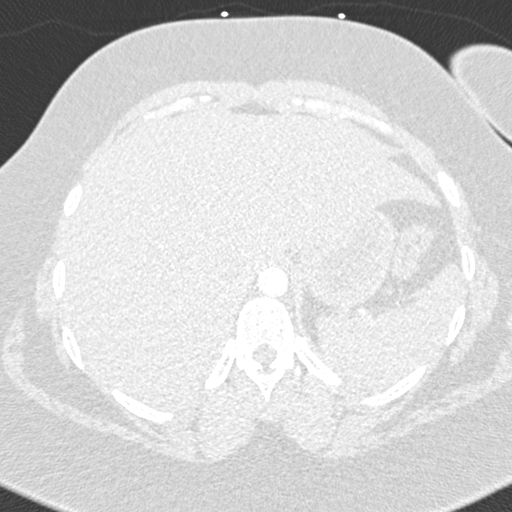
[im 24/236  mediastinal]
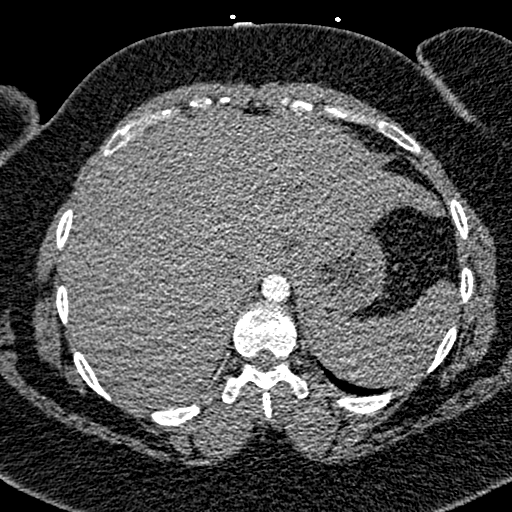
[im 36/236  lung]
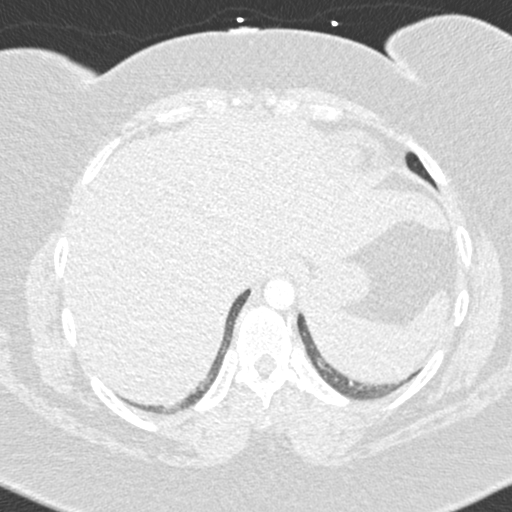
[im 48/236  mediastinal]
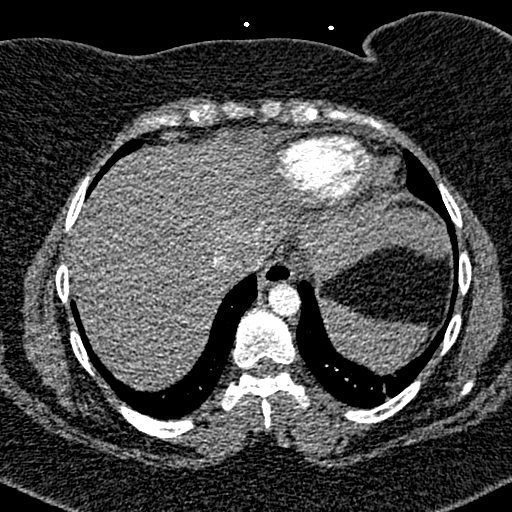
[im 71/236  lung]
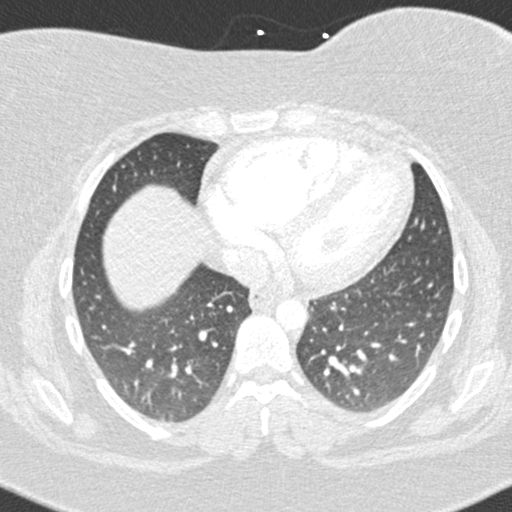
[im 83/236  mediastinal]
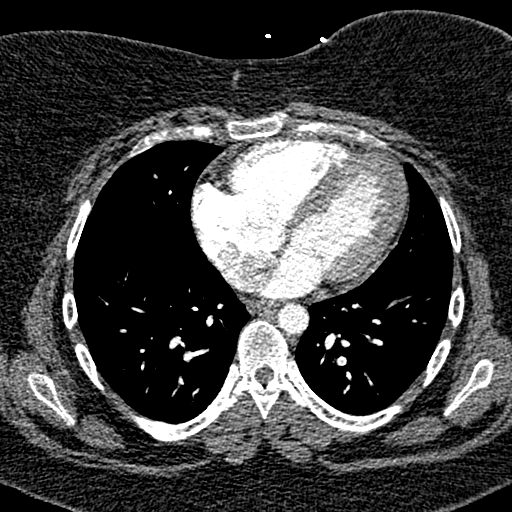
[im 95/236  lung]
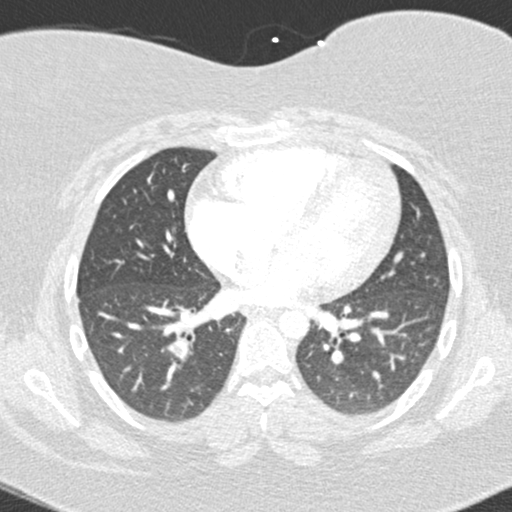
[im 106/236  mediastinal]
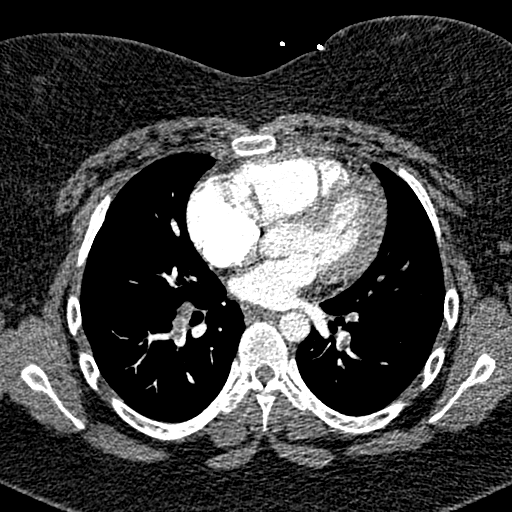
[im 118/236  lung]
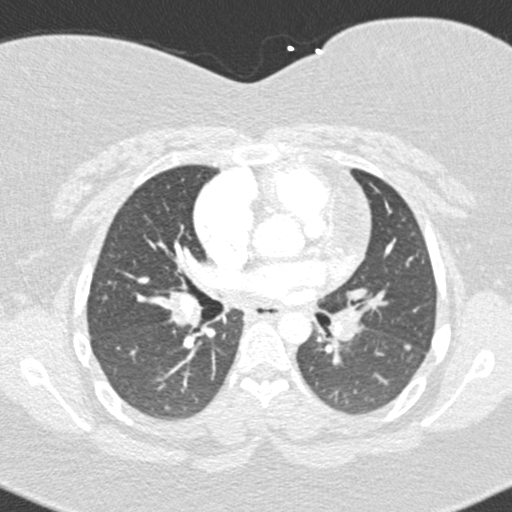
[im 130/236  mediastinal]
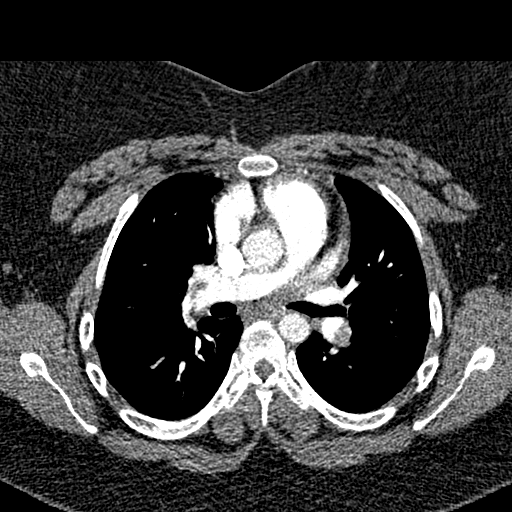
[im 142/236  lung]
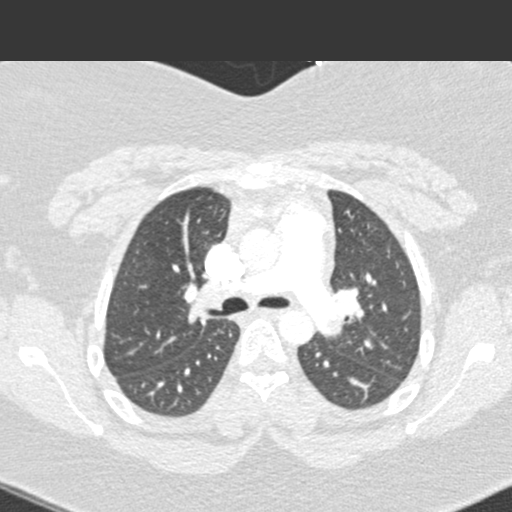
[im 153/236  mediastinal]
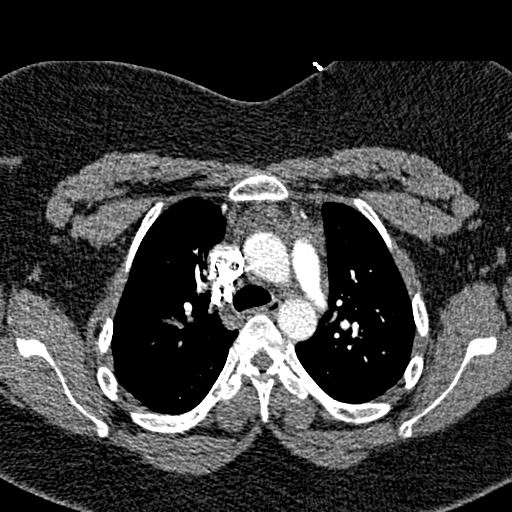
[im 165/236  lung]
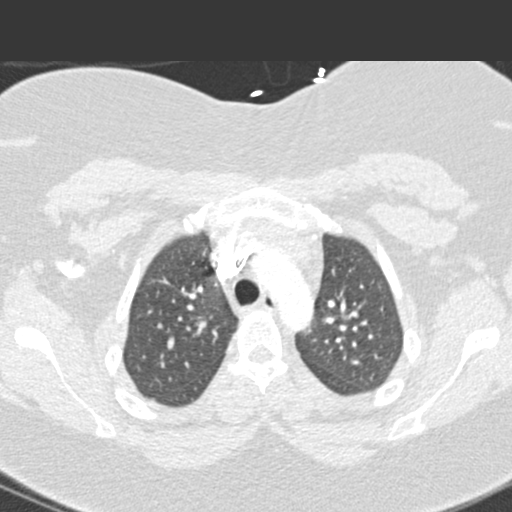
[im 189/236  mediastinal]
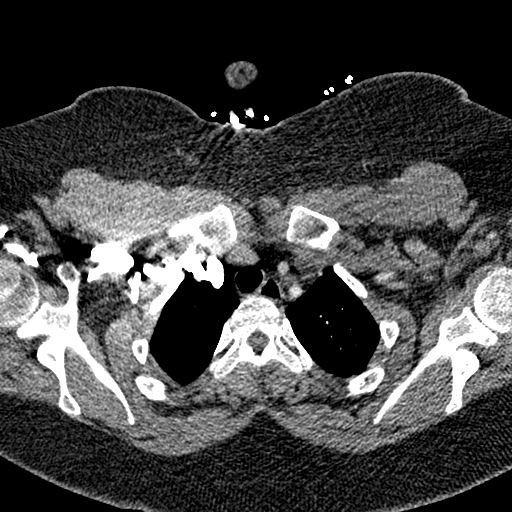
[im 200/236  lung]
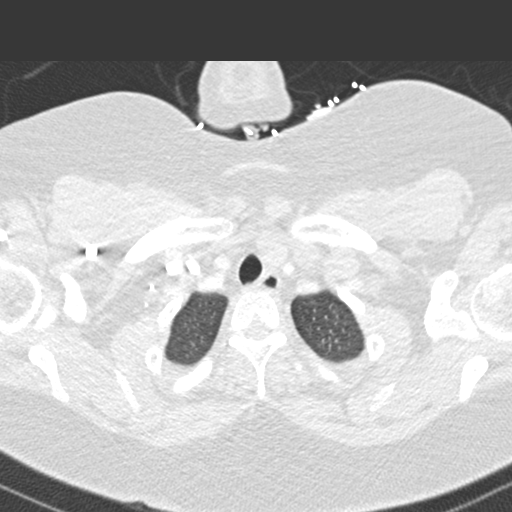
[im 212/236  mediastinal]
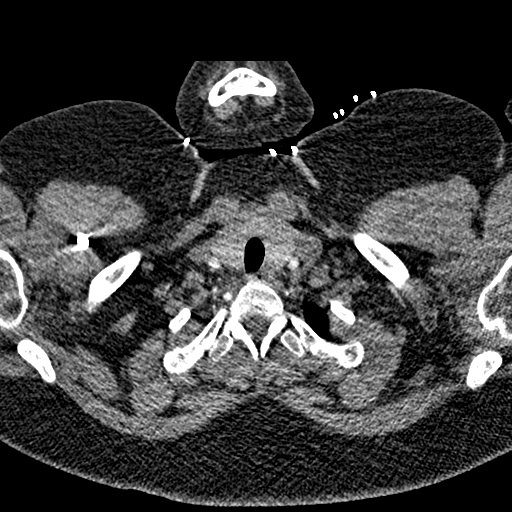
[im 224/236  lung]
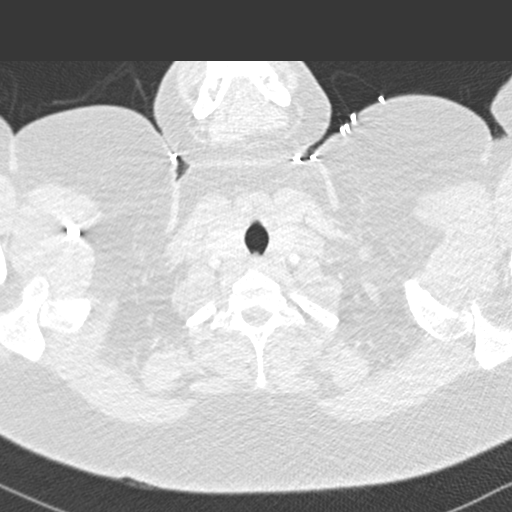

[Series 8: coronal mpr · coronal · 0.47mm/px · 1 of 113 slices shown]
[im 57/113  mediastinal]
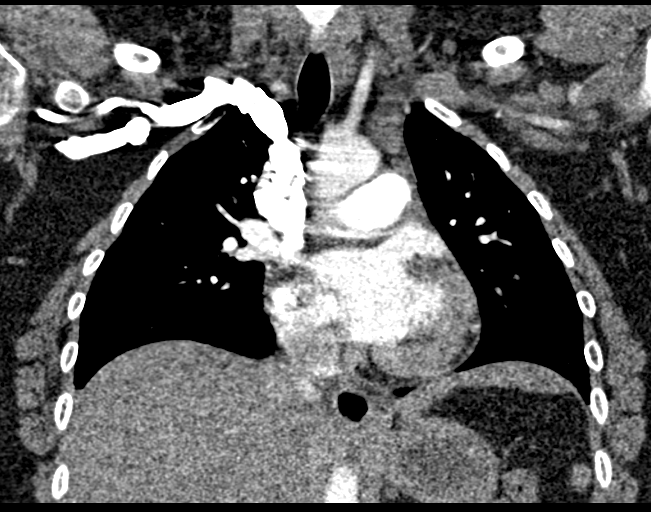

[18 of 36 positions shown; findings below may reference images not displayed]

FINDINGS: Cardiovascular: There is pulmonary embolism arising from each distal
main pulmonary artery and extending into multiple upper and lower
lobe pulmonary artery branches. The right ventricle to left
ventricle diameter ratio is approximately 1.1, abnormal and
indicative of right heart strain. There is no appreciable thoracic
aortic aneurysm or dissection. The visualized great vessels appear
normal. Note that the right and left common carotid arteries arise
as a common trunk, an anatomic variant. There is no appreciable
pericardial thickening.

Mediastinum/Nodes: Thyroid appears normal. There is no appreciable
adenopathy in the chest.

Lungs/Pleura: The lungs are clear except for slight atelectasis in
the right base laterally. No pleural effusion or pleural thickening.
No findings felt to represent pulmonary infarction.

Upper Abdomen: In the visualized upper abdomen, there is mild reflux
of contrast into the inferior vena cava and hepatic veins.
Visualized upper abdominal structures are otherwise normal.

Musculoskeletal: There are no appreciable blastic or lytic bone
lesions.

Review of the MIP images confirms the above findings.
IMPRESSION: Pulmonary emboli arising from the distal main pulmonary arteries
extending into multiple peripheral branches bilaterally. Positive
for acute PE with CT evidence of right heart strain (RV/LV Ratio =
1.1) consistent with at least submassive (intermediate risk) PE. The
presence of right heart strain has been associated with an increased
risk of morbidity and mortality. Please activate Code PE by paging
[phone].

Slight right base atelectasis. Lungs elsewhere clear. No adenopathy.
Reflux of contrast into the inferior vena cava and hepatic veins may
indicate a degree of increased right heart pressure.

Critical Value/emergent results were called by telephone at the time
of interpretation on 07/04/2016 at [DATE] to Dr. JISELLE PANGILINAN , who
verbally acknowledged these results.

## 2019-02-03 DIAGNOSIS — F3341 Major depressive disorder, recurrent, in partial remission: Secondary | ICD-10-CM | POA: Diagnosis not present

## 2019-02-10 DIAGNOSIS — F3341 Major depressive disorder, recurrent, in partial remission: Secondary | ICD-10-CM | POA: Diagnosis not present

## 2019-02-11 ENCOUNTER — Encounter (INDEPENDENT_AMBULATORY_CARE_PROVIDER_SITE_OTHER): Payer: Self-pay | Admitting: Family Medicine

## 2019-02-11 ENCOUNTER — Other Ambulatory Visit: Payer: Self-pay

## 2019-02-11 ENCOUNTER — Ambulatory Visit (INDEPENDENT_AMBULATORY_CARE_PROVIDER_SITE_OTHER): Payer: BC Managed Care – PPO | Admitting: Family Medicine

## 2019-02-11 VITALS — BP 128/87 | HR 86 | Temp 98.5°F | Ht 69.0 in | Wt 234.0 lb

## 2019-02-11 DIAGNOSIS — E8881 Metabolic syndrome: Secondary | ICD-10-CM

## 2019-02-11 DIAGNOSIS — Z6834 Body mass index (BMI) 34.0-34.9, adult: Secondary | ICD-10-CM

## 2019-02-11 DIAGNOSIS — Z9189 Other specified personal risk factors, not elsewhere classified: Secondary | ICD-10-CM

## 2019-02-11 DIAGNOSIS — E669 Obesity, unspecified: Secondary | ICD-10-CM | POA: Diagnosis not present

## 2019-02-11 DIAGNOSIS — F418 Other specified anxiety disorders: Secondary | ICD-10-CM | POA: Diagnosis not present

## 2019-02-11 MED ORDER — METFORMIN HCL 500 MG PO TABS: 500.0000 mg | ORAL_TABLET | Freq: Every day | ORAL | 0 refills | Status: DC

## 2019-02-11 MED ORDER — BUPROPION HCL ER (SR) 150 MG PO TB12: 150.0000 mg | ORAL_TABLET | Freq: Two times a day (BID) | ORAL | 0 refills | Status: DC

## 2019-02-16 NOTE — Progress Notes (Signed)
Office: [phone]  /  Fax: [phone]   HPI:   Chief Complaint: OBESITY Shannon [name] is here to discuss her progress with her obesity treatment plan. She is on the keep a food journal with 1300 to 1400 calories and 85 to 90 grams of protein daily plan and the Category 3 plan and is following her eating plan approximately 25 % of the time. She states she is jogging 30 minutes 2 times per week. Deoveon has been depressed and she does not feel like cooking. She has been eating out quite a bit. Her appetite is decreased at times however she does report binge-type eating. Her weight is 234 lb (106.1 kg) today and she has maintained weight since her last visit. She has lost 29 lbs since starting treatment with [place].  Insulin Resistance Gudalupe has a diagnosis of insulin resistance based on her elevated fasting insulin level >5. Although Shannon [name]'s blood glucose readings are still under good control, insulin resistance puts her at greater risk of metabolic syndrome and diabetes. Shannon [name] is taking metformin currently and she continues to work on diet and exercise to decrease risk of diabetes. Shannon [name] denies polyphagia. Lab Results  Component Value Date   HGBA1C 5.3 11/02/2018    At risk for diabetes Shannon [name] is at higher than average risk for developing diabetes due to her obesity and insulin resistance. She currently denies polyuria or polydipsia.  Depression with Anxiety Shannon [name] is feeling depressed. She reports going through times such as this in the past and she reports they eventually pass.  She is still seeing a counselor but not a psychiatrist. She also attends support group meeting regularly. Patient denies suicidal or homicidal ideations.  ASSESSMENT AND PLAN:  Insulin resistance - Plan: metFORMIN (GLUCOPHAGE) 500 MG tablet  Depression with anxiety - Plan: buPROPion (WELLBUTRIN SR) 150 MG 12 hr tablet  At risk for diabetes mellitus  Class 1 obesity with serious comorbidity and body mass index  (BMI) of 34.0 to 34.9 in adult, unspecified obesity type  PLAN:  Insulin Resistance Shannon [name] will continue to work on weight loss, exercise, and decreasing simple carbohydrates in her diet to help decrease the risk of diabetes. She was informed that eating too many simple carbohydrates or too many calories at one sitting increases the likelihood of GI side effects. Shannon [name] agrees to continue metformin 500 mg daily with breakfast #30 with no refills and follow up with [place] as directed to monitor her progress.  Diabetes risk counseling Shannon [name] was given extended (15 minutes) diabetes prevention counseling today. She is 28 y.o. female and has risk factors for diabetes including obesity and insulin resistance. We discussed intensive lifestyle modifications today with an emphasis on weight loss as well as increasing exercise and decreasing simple carbohydrates in her diet.  Depression with Anxiety Phone number was given to patient to call about referral to psychiatry. WE had referred her at a prior visit and [facility] contacted her but she never made an appointment. Anthonette agrees to continue Bupropion 150 mg BID #60 with no refills and follow up as directed.  Obesity Shannon [name] is currently in the action stage of change. As such, her goal is to continue with weight loss efforts She has agreed to keep a food journal with 1300 to 1400 calories and 80 to 90 grams of protein daily  Yancey will continue jogging for 30 minutes, 2 times per week for weight loss and overall health benefits. We discussed the following Behavioral Modification Strategies today: planning for success, increasing lean  protein intake, decreasing simple carbohydrates and decrease eating out  Shannon [name] has agreed to follow up with our clinic in 3 to 4 weeks. She was informed of the importance of frequent follow up visits to maximize her success with intensive lifestyle modifications for her multiple health conditions.  ALLERGIES: Allergies  Allergen  Reactions  . Bupropion Other (See Comments)    Reaction:  Constipation, states just the extended release    MEDICATIONS: Current Outpatient Medications on File Prior to Visit  Medication Sig Dispense Refill  . losartan (COZAAR) 25 MG tablet Take 1 tablet (25 mg total) by mouth daily. 90 tablet 2  . norethindrone (ORTHO MICRONOR) 0.35 MG tablet Take 1 tablet (0.35 mg total) by mouth daily. 3 Package 4  . Vitamin D, Ergocalciferol, 50 MCG (2000 UT) CAPS Take 2,000 Units by mouth daily. 30 capsule 0   No current facility-administered medications on file prior to visit.     PAST MEDICAL HISTORY: Past Medical History:  Diagnosis Date  . Anemia   . Anxiety   . Constipation   . Depression   . Dyspnea   . HTN (hypertension)   . Knee pain   . Leg edema   . Medical history non-contributory   . Obesity   . Pain in both feet   . Prolonged QT interval   . Pulmonary thromboembolism ([place])   . Suicide ideation     PAST SURGICAL HISTORY: Past Surgical History:  Procedure Laterality Date  . NO PAST SURGERIES    . TEETH REMOVAL  07/2017    SOCIAL HISTORY: Social History   Tobacco Use  . Smoking status: Never Smoker  . Smokeless tobacco: Never Used  Substance Use Topics  . Alcohol use: No  . Drug use: No    FAMILY HISTORY: Family History  Problem Relation Age of Onset  . Healthy Mother   . Hypertension Mother   . Obesity Mother   . Healthy Father   . Pulmonary embolism Neg Hx   . Deep vein thrombosis Neg Hx     ROS: Review of Systems  Constitutional: Negative for weight loss.  Genitourinary: Negative for frequency.  Endo/Heme/Allergies: Negative for polydipsia.       Negative for polyphagia  Psychiatric/Behavioral: Positive for depression. The patient is nervous/anxious.     PHYSICAL EXAM: Blood pressure 128/87, pulse 86, temperature 98.5 F (36.9 C), height 5\' 9"  (1.753 m), weight 234 lb (106.1 kg), SpO2 99 %. Body mass index is 34.56 kg/m. Physical Exam  Vitals signs reviewed.  Constitutional:      Appearance: Normal appearance. She is well-developed. She is obese.  Cardiovascular:     Rate and Rhythm: Normal rate.  Pulmonary:     Effort: Pulmonary effort is normal.  Musculoskeletal: Normal range of motion.  Skin:    General: Skin is warm and dry.  Neurological:     Mental Status: She is alert and oriented to person, place, and time.  Psychiatric:        Mood and Affect: Mood normal.        Behavior: Behavior normal.     RECENT LABS AND TESTS: BMET    Component Value Date/Time   NA 139 11/02/2018 0923   K 4.3 11/02/2018 0923   CL 102 11/02/2018 0923   CO2 21 11/02/2018 0923   GLUCOSE 79 11/02/2018 0923   GLUCOSE 98 10/15/2017 1635   BUN 8 11/02/2018 0923   CREATININE 1.01 (H) 11/02/2018 0923   CALCIUM 9.0 11/02/2018 0923  GFRNONAA 76 11/02/2018 0923   GFRAA 88 11/02/2018 0923   Lab Results  Component Value Date   HGBA1C 5.3 11/02/2018   HGBA1C 5.3 04/22/2018   HGBA1C 5.5 12/29/2017   HGBA1C 5.8 08/07/2017   Lab Results  Component Value Date   INSULIN 6.2 11/02/2018   INSULIN 11.9 04/22/2018   INSULIN 21.8 12/29/2017   CBC    Component Value Date/Time   WBC 5.0 04/22/2018 0915   WBC 5.7 08/07/2017 0938   RBC 4.70 04/22/2018 0915   RBC 4.90 08/07/2017 0938   HGB 11.9 04/22/2018 0915   HCT 37.9 04/22/2018 0915   PLT 354.0 08/07/2017 0938   MCV 81 04/22/2018 0915   MCH 25.3 (L) 04/22/2018 0915   MCH 24.5 (L) 07/06/2016 0536   MCHC 31.4 (L) 04/22/2018 0915   MCHC 32.7 08/07/2017 0938   RDW 13.8 04/22/2018 0915   LYMPHSABS 2.0 04/22/2018 0915   EOSABS 0.2 04/22/2018 0915   BASOSABS 0.1 04/22/2018 0915   Iron/TIBC/Ferritin/ %Sat    Component Value Date/Time   IRON 36 04/22/2018 0915   TIBC 340 04/22/2018 0915   FERRITIN 15 04/22/2018 0915   IRONPCTSAT 11 (L) 04/22/2018 0915   Lipid Panel     Component Value Date/Time   CHOL 207 (H) 11/02/2018 0923   TRIG 50 11/02/2018 0923   HDL 40 11/02/2018  0923   CHOLHDL 6 08/07/2017 0938   VLDL 22.0 08/07/2017 0938   LDLCALC 157 (H) 11/02/2018 0923   Hepatic Function Panel     Component Value Date/Time   PROT 7.2 11/02/2018 0923   ALBUMIN 4.4 11/02/2018 0923   AST 16 11/02/2018 0923   ALT 12 11/02/2018 0923   ALKPHOS 102 11/02/2018 0923   BILITOT 0.6 11/02/2018 0923      Component Value Date/Time   TSH 0.792 12/29/2017 1051   TSH 1.16 08/07/2017 0938   TSH 1.69 08/06/2016 0855     Ref. Range 11/02/2018 09:23  Vitamin D, 25-Hydroxy Latest Ref Range: 30.0 - 100.0 ng/mL 45.9    OBESITY BEHAVIORAL INTERVENTION VISIT  Today's visit was # 25  Starting weight: 263 lbs Starting date: 12/29/2017 Today's weight : 234 lbs Today's date: 02/11/2019 Total lbs lost to date: 29    02/11/2019  Height 5\' 9"  (1.753 m)  Weight 234 lb (106.1 kg)  BMI (Calculated) 34.54  BLOOD PRESSURE - SYSTOLIC 0000000  BLOOD PRESSURE - DIASTOLIC 87   Body Fat % 123456 %  Total Body Water (lbs) 93.2 lbs    ASK: We discussed the diagnosis of obesity with Marcene Brawn today and Charlise agreed to give [place] permission to discuss obesity behavioral modification therapy today.  ASSESS: Shannon [name] has the diagnosis of obesity and her BMI today is 34.54 Shannon [name] is in the action stage of change   ADVISE: Shannon [name] was educated on the multiple health risks of obesity as well as the benefit of weight loss to improve her health. She was advised of the need for long term treatment and the importance of lifestyle modifications to improve her current health and to decrease her risk of future health problems.  AGREE: Multiple dietary modification options and treatment options were discussed and  Shannon [name] agreed to follow the recommendations documented in the above note.  ARRANGE: Shannon [name] was educated on the importance of frequent visits to treat obesity as outlined per CMS and USPSTF guidelines and agreed to schedule her next follow up appointment today.  Corey Skains, am  acting as Location manager for [employer]  Jorja Empie, FNP-C  I have reviewed the above documentation for accuracy and completeness, and I agree with the above.  - Vannessa Godown, FNP-C.

## 2019-02-17 DIAGNOSIS — F3341 Major depressive disorder, recurrent, in partial remission: Secondary | ICD-10-CM | POA: Diagnosis not present

## 2019-02-22 ENCOUNTER — Encounter (INDEPENDENT_AMBULATORY_CARE_PROVIDER_SITE_OTHER): Payer: Self-pay | Admitting: Family Medicine

## 2019-03-03 DIAGNOSIS — F3341 Major depressive disorder, recurrent, in partial remission: Secondary | ICD-10-CM | POA: Diagnosis not present

## 2019-03-08 ENCOUNTER — Ambulatory Visit (INDEPENDENT_AMBULATORY_CARE_PROVIDER_SITE_OTHER): Payer: BC Managed Care – PPO | Admitting: Family Medicine

## 2019-03-08 ENCOUNTER — Other Ambulatory Visit: Payer: Self-pay

## 2019-03-08 ENCOUNTER — Encounter (INDEPENDENT_AMBULATORY_CARE_PROVIDER_SITE_OTHER): Payer: Self-pay | Admitting: Family Medicine

## 2019-03-08 VITALS — BP 124/83 | HR 84 | Temp 98.4°F | Ht 69.0 in | Wt 234.0 lb

## 2019-03-08 DIAGNOSIS — E8881 Metabolic syndrome: Secondary | ICD-10-CM | POA: Diagnosis not present

## 2019-03-08 DIAGNOSIS — E7849 Other hyperlipidemia: Secondary | ICD-10-CM | POA: Diagnosis not present

## 2019-03-08 DIAGNOSIS — E559 Vitamin D deficiency, unspecified: Secondary | ICD-10-CM

## 2019-03-08 DIAGNOSIS — Z9189 Other specified personal risk factors, not elsewhere classified: Secondary | ICD-10-CM | POA: Diagnosis not present

## 2019-03-08 DIAGNOSIS — E66811 Obesity, class 1: Secondary | ICD-10-CM

## 2019-03-08 DIAGNOSIS — D508 Other iron deficiency anemias: Secondary | ICD-10-CM

## 2019-03-08 DIAGNOSIS — Z6834 Body mass index (BMI) 34.0-34.9, adult: Secondary | ICD-10-CM

## 2019-03-08 DIAGNOSIS — E88819 Insulin resistance, unspecified: Secondary | ICD-10-CM

## 2019-03-08 DIAGNOSIS — F418 Other specified anxiety disorders: Secondary | ICD-10-CM

## 2019-03-08 DIAGNOSIS — E669 Obesity, unspecified: Secondary | ICD-10-CM

## 2019-03-08 MED ORDER — BUPROPION HCL ER (SR) 150 MG PO TB12: 150.0000 mg | ORAL_TABLET | Freq: Two times a day (BID) | ORAL | 0 refills | Status: DC

## 2019-03-08 MED ORDER — METFORMIN HCL 500 MG PO TABS: 500.0000 mg | ORAL_TABLET | Freq: Every day | ORAL | 0 refills | Status: DC

## 2019-03-10 DIAGNOSIS — F3341 Major depressive disorder, recurrent, in partial remission: Secondary | ICD-10-CM | POA: Diagnosis not present

## 2019-03-10 LAB — CBC WITH DIFFERENTIAL/PLATELET
Basophils Absolute: 0.1 10*3/uL (ref 0.0–0.2)
Basos: 1 %
EOS (ABSOLUTE): 0.1 10*3/uL (ref 0.0–0.4)
Eos: 2 %
Hemoglobin: 13.1 g/dL (ref 11.1–15.9)
Immature Grans (Abs): 0 10*3/uL (ref 0.0–0.1)
Immature Granulocytes: 0 %
Lymphocytes Absolute: 2.3 10*3/uL (ref 0.7–3.1)
Lymphs: 48 %
MCH: 27.3 pg (ref 26.6–33.0)
MCHC: 32.6 g/dL (ref 31.5–35.7)
MCV: 84 fL (ref 79–97)
Monocytes Absolute: 0.4 10*3/uL (ref 0.1–0.9)
Monocytes: 8 %
Neutrophils Absolute: 2 10*3/uL (ref 1.4–7.0)
Neutrophils: 41 %
Platelets: 339 10*3/uL (ref 150–450)
RBC: 4.8 x10E6/uL (ref 3.77–5.28)
RDW: 12.7 % (ref 11.7–15.4)
WBC: 4.8 10*3/uL (ref 3.4–10.8)

## 2019-03-10 LAB — ANEMIA PANEL
Ferritin: 13 ng/mL — ABNORMAL LOW (ref 15–150)
Folate, Hemolysate: 368 ng/mL
Folate, RBC: 915 ng/mL (ref >498)
Hematocrit: 40.2 % (ref 34.0–46.6)
Iron Saturation: 16 % (ref 15–55)
Iron: 54 ug/dL (ref 27–159)
Retic Ct Pct: 1 % (ref 0.6–2.6)
Total Iron Binding Capacity: 343 ug/dL (ref 250–450)
UIBC: 289 ug/dL (ref 131–425)
Vitamin B-12: 624 pg/mL (ref 232–1245)

## 2019-03-10 LAB — LIPID PANEL WITH LDL/HDL RATIO
Cholesterol, Total: 216 mg/dL — ABNORMAL HIGH (ref 100–199)
HDL: 46 mg/dL (ref >39)
LDL Chol Calc (NIH): 158 mg/dL — ABNORMAL HIGH (ref 0–99)
LDL/HDL Ratio: 3.4 ratio — ABNORMAL HIGH (ref 0.0–3.2)
Triglycerides: 68 mg/dL (ref 0–149)
VLDL Cholesterol Cal: 12 mg/dL (ref 5–40)

## 2019-03-10 LAB — COMPREHENSIVE METABOLIC PANEL
ALT: 7 IU/L (ref 0–32)
AST: 14 IU/L (ref 0–40)
Albumin/Globulin Ratio: 1.4 (ref 1.2–2.2)
Albumin: 4.5 g/dL (ref 3.9–5.0)
Alkaline Phosphatase: 128 IU/L — ABNORMAL HIGH (ref 39–117)
BUN/Creatinine Ratio: 7 — ABNORMAL LOW (ref 9–23)
BUN: 8 mg/dL (ref 6–20)
Bilirubin Total: 0.9 mg/dL (ref 0.0–1.2)
CO2: 23 mmol/L (ref 20–29)
Calcium: 9.3 mg/dL (ref 8.7–10.2)
Chloride: 105 mmol/L (ref 96–106)
Creatinine, Ser: 1.1 mg/dL — ABNORMAL HIGH (ref 0.57–1.00)
GFR calc Af Amer: 78 mL/min/{1.73_m2} (ref >59)
GFR calc non Af Amer: 68 mL/min/{1.73_m2} (ref >59)
Globulin, Total: 3.2 g/dL (ref 1.5–4.5)
Glucose: 74 mg/dL (ref 65–99)
Potassium: 4.1 mmol/L (ref 3.5–5.2)
Sodium: 141 mmol/L (ref 134–144)
Total Protein: 7.7 g/dL (ref 6.0–8.5)

## 2019-03-10 LAB — INSULIN, RANDOM: INSULIN: 4 u[IU]/mL (ref 2.6–24.9)

## 2019-03-10 LAB — VITAMIN D 25 HYDROXY (VIT D DEFICIENCY, FRACTURES): Vit D, 25-Hydroxy: 55.1 ng/mL (ref 30.0–100.0)

## 2019-03-10 LAB — HEMOGLOBIN A1C
Est. average glucose Bld gHb Est-mCnc: 105 mg/dL
Hgb A1c MFr Bld: 5.3 % (ref 4.8–5.6)

## 2019-03-10 NOTE — Progress Notes (Signed)
Office: [phone]  /  Fax: [phone]   HPI:  Chief Complaint: OBESITY Shannon [name] is here to discuss her progress with her obesity treatment plan. She is on the keep a food journal with 1300 to 1400 calories and 85 to 90 grams of protein daily plan and the Category 3 plan and states she is following her eating plan approximately 25 % of the time. She states she is exercising 0 minutes 0 times per week.  Shannon [name] is not exercising because of lack of time since she now has a full time job. She is still eating out quite often. Shannon [name] cooks food but she still goes out to eat and she ends up throwing food away.  Iron Deficiency Anemia Shannon [name] has a history of iron deficiency anemia. She is not on iron supplementation. She denies heavy menstrual periods.  Insulin Resistance Shannon [name] has a diagnosis of insulin resistance. She is on metformin and she denies polyphagia or hypoglycemia.     At risk for diabetes Shannon [name] is at higher than average risk for developing diabetes due to her obesity.     Hyperlipidemia Shannon [name] has hyperlipidemia and she is not on statin. Her last LDL was elevated at 157. Her HDL is low at 40 and triglycerides were normal. She denies chest pain or shortness of breath.    Vitamin D deficiency Shannon [name] has a diagnosis of vitamin D deficiency. She is on OTC vitamin D3 4,000 IU daily. Her last vitamin D level was slightly low at 45.9. She denies nausea, vomiting or muscle weakness.  Depression with Anxiety Shannon [name] feels her mood is stable. She is still seeing a counselor and she attends a support group.   Today's visit was # 26  Starting weight: 263 lbs Starting date: 12/29/2017 Today's weight : 234 lbs  Today's date: 03/08/2019 Total lbs lost to date: 29 Total lbs lost since last in-office visit: 0  ASSESSMENT AND PLAN:  Other iron deficiency anemia - Plan: Anemia panel, CBC w/Diff/Platelet  Insulin resistance - Plan: Comprehensive Metabolic Panel (CMET), HgB A1c,  Insulin, random, metFORMIN (GLUCOPHAGE) 500 MG tablet  At risk for diabetes mellitus  Other hyperlipidemia - Plan: Lipid Panel With LDL/HDL Ratio  Depression with anxiety - Plan: buPROPion (WELLBUTRIN SR) 150 MG 12 hr tablet  Vitamin D deficiency - Plan: Vitamin D (25 hydroxy)  Class 1 obesity with serious comorbidity and body mass index (BMI) of 34.0 to 34.9 in adult, unspecified obesity type  PLAN:  Iron Deficiency Anemia We will check CBC and anemia panel. Shannon [name] will follow up with our clinic in 3 to 4 weeks.  Insulin Resistance Shannon [name] will continue to work on weight loss, exercise, and decreasing simple carbohydrates to help decrease the risk of diabetes. We will check fasting glucose, insulin and A1c today. Shannon [name] agreed to continue metformin 500 mg daily #30 with no refills and follow up with [place] as directed to closely monitor her progress.  Diabetes risk counseling (~15 min) Shannon [name] is a 28 y.o. female and has risk factors for diabetes including obesity. We discussed intensive lifestyle modifications today with an emphasis on weight loss as well as increasing exercise and decreasing simple carbohydrates in her diet.  Hyperlipidemia Intensive lifestyle modifications as the first line treatment for hyperlipidemia. We discussed many lifestyle modifications today and Shannon [name] will continue to work on diet, exercise and weight loss efforts. We will check fasting lipid panel today.  Vitamin D Deficiency Low vitamin D level contributes to fatigue and are associated with obesity, breast,  and colon cancer. Shannon [name] will continue to take OTC Vit D3 and she will follow up for routine testing of vitamin D, at least 2-3 times per year to avoid over-replacement. We will check vitamin D level today.  Depression with Anxiety Shannon [name] agrees to continue Bupropion 150 mg BID#60 with no refills and follow up as directed.   Obesity Shannon [name] is currently in the action stage of change. As such, her goal  is to continue with weight loss efforts She has agreed to keep a food journal with 1300 to 1400 calories and 80 to 90 grams of protein daily Shannon [name] will walk for 30 minutes, 2 times per week for weight loss and overall health benefits. We discussed the following Behavioral Modification Strategies today: planning for success, keep a strict food journal, increasing lean protein intake and decrease eating out Shannon [name] will journal at least 5 days per week.  Shannon [name] has agreed to follow up with our clinic in 3 to 4 weeks. She was informed of the importance of frequent follow up visits to maximize her success with intensive lifestyle modifications for her multiple health conditions.  ALLERGIES: Allergies  Allergen Reactions  . Bupropion Other (See Comments)    Reaction:  Constipation, states just the extended release    MEDICATIONS: Current Outpatient Medications on File Prior to Visit  Medication Sig Dispense Refill  . losartan (COZAAR) 25 MG tablet Take 1 tablet (25 mg total) by mouth daily. 90 tablet 2  . norethindrone (ORTHO MICRONOR) 0.35 MG tablet Take 1 tablet (0.35 mg total) by mouth daily. 3 Package 4  . Vitamin D, Ergocalciferol, 50 MCG (2000 UT) CAPS Take 2,000 Units by mouth daily. 30 capsule 0   No current facility-administered medications on file prior to visit.    PAST MEDICAL HISTORY: Past Medical History:  Diagnosis Date  . Anemia   . Anxiety   . Constipation   . Depression   . Dyspnea   . HTN (hypertension)   . Knee pain   . Leg edema   . Medical history non-contributory   . Obesity   . Pain in both feet   . Prolonged QT interval   . Pulmonary thromboembolism ([place])   . Suicide ideation     PAST SURGICAL HISTORY: Past Surgical History:  Procedure Laterality Date  . NO PAST SURGERIES    . TEETH REMOVAL  07/2017    SOCIAL HISTORY: Social History   Tobacco Use  . Smoking status: Never Smoker  . Smokeless tobacco: Never Used  Substance Use Topics  .  Alcohol use: No  . Drug use: No    FAMILY HISTORY: Family History  Problem Relation Age of Onset  . Healthy Mother   . Hypertension Mother   . Obesity Mother   . Healthy Father   . Pulmonary embolism Neg Hx   . Deep vein thrombosis Neg Hx     ROS: Review of Systems  Constitutional: Negative for weight loss.  Respiratory: Negative for shortness of breath.   Cardiovascular: Negative for chest pain.  Gastrointestinal: Negative for nausea and vomiting.  Musculoskeletal:       Negative for muscle weakness  Endo/Heme/Allergies:       Negative for polyphagia Negative for hypoglycemia  Psychiatric/Behavioral: Positive for depression. The patient is nervous/anxious.     PHYSICAL EXAM: Blood pressure 124/83, pulse 84, temperature 98.4 F (36.9 C), temperature source Oral, height 5\' 9"  (1.753 m), weight 234 lb (106.1 kg), last menstrual period 03/02/2019, SpO2 100 %. Body  mass index is 34.56 kg/m. Physical Exam Vitals reviewed.  Constitutional:      General: She is not in acute distress.    Appearance: Normal appearance. She is well-developed. She is obese.  Cardiovascular:     Rate and Rhythm: Normal rate.  Pulmonary:     Effort: Pulmonary effort is normal.  Musculoskeletal:        General: Normal range of motion.  Skin:    General: Skin is warm and dry.  Neurological:     Mental Status: She is alert and oriented to person, place, and time.  Psychiatric:        Mood and Affect: Mood normal.        Behavior: Behavior normal.     RECENT LABS AND TESTS: BMET    Component Value Date/Time   NA 141 03/08/2019 1654   K 4.1 03/08/2019 1654   CL 105 03/08/2019 1654   CO2 23 03/08/2019 1654   GLUCOSE 74 03/08/2019 1654   GLUCOSE 98 10/15/2017 1635   BUN 8 03/08/2019 1654   CREATININE 1.10 (H) 03/08/2019 1654   CALCIUM 9.3 03/08/2019 1654   GFRNONAA 68 03/08/2019 1654   GFRAA 78 03/08/2019 1654   Lab Results  Component Value Date   HGBA1C 5.3 03/08/2019   HGBA1C  5.3 11/02/2018   HGBA1C 5.3 04/22/2018   HGBA1C 5.5 12/29/2017   HGBA1C 5.8 08/07/2017   Lab Results  Component Value Date   INSULIN 4.0 03/08/2019   INSULIN 6.2 11/02/2018   INSULIN 11.9 04/22/2018   INSULIN 21.8 12/29/2017   CBC    Component Value Date/Time   WBC 4.8 03/08/2019 1654   WBC 5.7 08/07/2017 0938   RBC 4.80 03/08/2019 1654   RBC 4.90 08/07/2017 0938   HGB 13.1 03/08/2019 1654   HCT 40.2 03/08/2019 1654   PLT 339 03/08/2019 1654   MCV 84 03/08/2019 1654   MCH 27.3 03/08/2019 1654   MCH 24.5 (L) 07/06/2016 0536   MCHC 32.6 03/08/2019 1654   MCHC 32.7 08/07/2017 0938   RDW 12.7 03/08/2019 1654   LYMPHSABS 2.3 03/08/2019 1654   EOSABS 0.1 03/08/2019 1654   BASOSABS 0.1 03/08/2019 1654   Iron/TIBC/Ferritin/ %Sat    Component Value Date/Time   IRON 54 03/08/2019 1654   TIBC 343 03/08/2019 1654   FERRITIN 13 (L) 03/08/2019 1654   IRONPCTSAT 16 03/08/2019 1654   Lipid Panel     Component Value Date/Time   CHOL 216 (H) 03/08/2019 1654   TRIG 68 03/08/2019 1654   HDL 46 03/08/2019 1654   CHOLHDL 6 08/07/2017 0938   VLDL 22.0 08/07/2017 0938   LDLCALC 158 (H) 03/08/2019 1654   Hepatic Function Panel     Component Value Date/Time   PROT 7.7 03/08/2019 1654   ALBUMIN 4.5 03/08/2019 1654   AST 14 03/08/2019 1654   ALT 7 03/08/2019 1654   ALKPHOS 128 (H) 03/08/2019 1654   BILITOT 0.9 03/08/2019 1654      Component Value Date/Time   TSH 0.792 12/29/2017 1051   TSH 1.16 08/07/2017 0938   TSH 1.69 08/06/2016 0855     Ref. Range 03/08/2019 16:54  Vitamin D, 25-Hydroxy Latest Ref Range: 30.0 - 100.0 ng/mL 55.1   I, Doreene Nest, am acting as Location manager for [employer], FNP-C  I have reviewed the above documentation for accuracy and completeness, and I agree with the above.  - Charo Philipp, FNP-C.

## 2019-03-11 ENCOUNTER — Encounter (INDEPENDENT_AMBULATORY_CARE_PROVIDER_SITE_OTHER): Payer: Self-pay | Admitting: Family Medicine

## 2019-03-17 ENCOUNTER — Encounter: Payer: Self-pay | Admitting: Family

## 2019-03-17 ENCOUNTER — Other Ambulatory Visit: Payer: Self-pay

## 2019-03-17 ENCOUNTER — Ambulatory Visit (INDEPENDENT_AMBULATORY_CARE_PROVIDER_SITE_OTHER): Payer: BC Managed Care – PPO | Admitting: Family

## 2019-03-17 DIAGNOSIS — I10 Essential (primary) hypertension: Secondary | ICD-10-CM

## 2019-03-17 DIAGNOSIS — F3341 Major depressive disorder, recurrent, in partial remission: Secondary | ICD-10-CM | POA: Diagnosis not present

## 2019-03-17 DIAGNOSIS — F339 Major depressive disorder, recurrent, unspecified: Secondary | ICD-10-CM | POA: Diagnosis not present

## 2019-03-17 MED ORDER — LOSARTAN POTASSIUM 25 MG PO TABS: 25.0000 mg | ORAL_TABLET | Freq: Every day | ORAL | 3 refills | Status: DC

## 2019-03-17 NOTE — Progress Notes (Signed)
Shannon [name] is a 28 y.o. female with the following history as recorded in Epic[employer]:  Patient Active Problem List   Diagnosis Date Noted  . Other hyperlipidemia 04/22/2018  . Insulin resistance 04/02/2018  . Shortness of breath on exertion 12/29/2017  . Essential hypertension 12/29/2017  . Vitamin D deficiency 12/29/2017  . Class 1 obesity with serious comorbidity and body mass index (BMI) of 34.0 to 34.9 in adult 08/11/2017  . Routine general medical examination at a health care facility 08/10/2017  . Hypertension 12/30/2016  . Slurred speech 12/30/2016  . Iron deficiency anemia 09/24/2016  . Fatigue 08/06/2016  . Pulmonary embolism with acute cor pulmonale ([place]) 07/04/2016  . Hypokalemia 07/04/2016  . Prolonged QT interval 07/04/2016  . Depression with anxiety 07/04/2016  . Adjustment disorder with emotional disturbance 03/31/2016  . Major depressive disorder, recurrent severe without psychotic features ([place]) 01/10/2016  . Major depressive disorder, recurrent episode, mild ([place]) 01/01/2016  . Suicide ideation 02/16/2013    Current Outpatient Medications  Medication Sig Dispense Refill  . buPROPion (WELLBUTRIN SR) 150 MG 12 hr tablet Take 1 tablet (150 mg total) by mouth 2 (two) times daily. 60 tablet 0  . losartan (COZAAR) 25 MG tablet Take 1 tablet (25 mg total) by mouth daily. 90 tablet 3  . metFORMIN (GLUCOPHAGE) 500 MG tablet Take 1 tablet (500 mg total) by mouth daily with breakfast. 30 tablet 0  . norethindrone (ORTHO MICRONOR) 0.35 MG tablet Take 1 tablet (0.35 mg total) by mouth daily. 3 Package 4  . Vitamin D, Ergocalciferol, 50 MCG (2000 UT) CAPS Take 2,000 Units by mouth daily. 30 capsule 0   No current facility-administered medications for this visit.    Allergies: Bupropion  Past Medical History:  Diagnosis Date  . Anemia   . Anxiety   . Constipation   . Depression   . Dyspnea   . HTN (hypertension)   . Knee pain   . Leg edema   . Medical history  non-contributory   . Obesity   . Pain in both feet   . Prolonged QT interval   . Pulmonary thromboembolism ([place])   . Suicide ideation     Past Surgical History:  Procedure Laterality Date  . NO PAST SURGERIES    . TEETH REMOVAL  07/2017    Family History  Problem Relation Age of Onset  . Healthy Mother   . Hypertension Mother   . Obesity Mother   . Healthy Father   . Pulmonary embolism Neg Hx   . Deep vein thrombosis Neg Hx     Social History   Tobacco Use  . Smoking status: Never Smoker  . Smokeless tobacco: Never Used  Substance Use Topics  . Alcohol use: No    Subjective:  6 month follow-up on hypertension; in baseline state of health today; Denies any chest pain, shortness of breath, blurred vision or headache Majority of healthcare needs are managed by her GYN/ weight loss specialist; is establishing with a new psychiatrist today; Needs refill on her medication;   This visit occurred during the SARS-CoV-2 public health emergency.  Safety protocols were in place, including screening questions prior to the visit, additional usage of staff PPE, and extensive cleaning of exam room while observing appropriate contact time as indicated for disinfecting solutions.       Objective:  Vitals:   03/17/19 0900  BP: 122/78  Pulse: 98  Temp: 97.9 F (36.6 C)  TempSrc: Oral  SpO2: 99%  Weight:  243 lb 12.8 oz (110.6 kg)  Height: 5\' 9"  (1.753 m)    General: Well developed, well nourished, in no acute distress  Skin : Warm and dry.  Head: Normocephalic and atraumatic  Lungs: Respirations unlabored; clear to auscultation bilaterally without wheeze, rales, rhonchi  CVS exam: normal rate and regular rhythm.  Neurologic: Alert and oriented; speech intact; face symmetrical; moves all extremities well; CNII-XII intact without focal deficit   Assessment:  1. Essential hypertension     Plan:  Stable; refills updated; labs are being managed by specialist; follow-up in 1 year,  sooner prn.  Continue working on weight loss goals with her weight loss specialist;   No follow-ups on file.  No orders of the defined types were placed in this encounter.   Requested Prescriptions   Signed Prescriptions Disp Refills  . losartan (COZAAR) 25 MG tablet 90 tablet 3    Sig: Take 1 tablet (25 mg total) by mouth daily.

## 2019-03-31 DIAGNOSIS — F3341 Major depressive disorder, recurrent, in partial remission: Secondary | ICD-10-CM | POA: Diagnosis not present

## 2019-04-01 ENCOUNTER — Other Ambulatory Visit: Payer: Self-pay

## 2019-04-01 ENCOUNTER — Encounter (INDEPENDENT_AMBULATORY_CARE_PROVIDER_SITE_OTHER): Payer: Self-pay | Admitting: Family Medicine

## 2019-04-01 ENCOUNTER — Ambulatory Visit (INDEPENDENT_AMBULATORY_CARE_PROVIDER_SITE_OTHER): Payer: BC Managed Care – PPO | Admitting: Family Medicine

## 2019-04-01 VITALS — BP 139/88 | HR 91 | Temp 98.4°F | Ht 69.0 in | Wt 236.0 lb

## 2019-04-01 DIAGNOSIS — E669 Obesity, unspecified: Secondary | ICD-10-CM

## 2019-04-01 DIAGNOSIS — D509 Iron deficiency anemia, unspecified: Secondary | ICD-10-CM | POA: Diagnosis not present

## 2019-04-01 DIAGNOSIS — Z9189 Other specified personal risk factors, not elsewhere classified: Secondary | ICD-10-CM | POA: Diagnosis not present

## 2019-04-01 DIAGNOSIS — E8881 Metabolic syndrome: Secondary | ICD-10-CM

## 2019-04-01 DIAGNOSIS — F418 Other specified anxiety disorders: Secondary | ICD-10-CM

## 2019-04-01 DIAGNOSIS — E559 Vitamin D deficiency, unspecified: Secondary | ICD-10-CM | POA: Diagnosis not present

## 2019-04-01 DIAGNOSIS — Z6834 Body mass index (BMI) 34.0-34.9, adult: Secondary | ICD-10-CM

## 2019-04-01 DIAGNOSIS — E88819 Insulin resistance, unspecified: Secondary | ICD-10-CM

## 2019-04-01 MED ORDER — POLYSACCHARIDE IRON COMPLEX 150 MG PO CAPS: 150.0000 mg | ORAL_CAPSULE | Freq: Every day | ORAL | 0 refills | Status: DC

## 2019-04-01 MED ORDER — METFORMIN HCL 500 MG PO TABS: 500.0000 mg | ORAL_TABLET | Freq: Every day | ORAL | 0 refills | Status: DC

## 2019-04-01 MED ORDER — BUPROPION HCL ER (SR) 150 MG PO TB12: 150.0000 mg | ORAL_TABLET | Freq: Two times a day (BID) | ORAL | 0 refills | Status: DC

## 2019-04-06 NOTE — Progress Notes (Addendum)
Chief Complaint:   OBESITY Shannon [name] is here to discuss her progress with her obesity treatment plan along with follow-up of her obesity related diagnoses. Shannon [name] is on the Category 3 Plan and journaling 1300-1400 calories + 85-90 grams of protein and states she is following her eating plan approximately 25% of the time. Shannon [name] states she is jogging 30 minutes 3 times per week.  Today's visit was #: 53 Starting weight: 263 lbs Starting date: 12/29/2017 Today's weight: 236 lbs Today's date: 04/01/2019 Total lbs lost to date: 27 Total lbs lost since last in-office visit: 0  Interim History: Tamrah has been off the plan since her last visit. She started back on the plan today and has journaled her food today.  Subjective:   Iron deficiency anemia, unspecified iron deficiency anemia type. Aylinn reports she has always had IDA, even as a kid. She reports periods used to be heavy, but are better since she has lost weight. H&H are normal, but ferritin is slightly low.  At risk for constipation Shannon [name] is at increased risk for constipation due to inadequate water intake, changes in diet, and/or use of medications such as GLP1 agonists. Shannon [name] denies hard, infrequent stools currently.   Insulin resistance.  Shannon [name] was previously pre-diabetic. A1c is stable at 5.3. She is on metformin and denies polyphagia.  Vitamin D deficiency. Vitamin D is at goal. She is on OTC 4,000 IU daily.  Depression with anxiety. Mood is stable. Shannon [name] reports some days are better than others. She feels the dark days of winter make her feel worse. Therapist is now talking to her once weekly about her diet.   Assessment/Plan:   Iron deficiency anemia, unspecified iron deficiency anemia type. New prescription was given for Iron polysaccharides (NU-IRON) 150 MG capsule QD 30 with 0 refills.   At risk for constipation Shannon [name] was given approximately 15 minutes of counseling today regarding prevention of  constipation. She was encouraged to increase water and fiber intake.  Insulin resistance. metFORMIN (GLUCOPHAGE) 500 MG tablet QAM #30 with 0 refills was refilled.  Vitamin D deficiency. Shannon [name] will continue OTC Vitamin D 4,000 IU daily.  Depression with anxiety. buPROPion (WELLBUTRIN SR) 150 MG 12 hr tablet BID #60 with 0 refills was refilled. She will see her psychologist as scheduled.  Class 1 obesity with serious comorbidity and body mass index (BMI) of 34.0 to 34.9 in adult, unspecified obesity type.  Shannon [name] is currently in the action stage of change. As such, her goal is to continue with weight loss efforts. She has agreed to keeping a food journal and adhering to recommended goals of 1300-1400 calories and 90-100 grams of protein.   We discussed the following exercise goals today: Go to the gym 3 times per week. She will do both resistance and cardio.  We discussed the following behavioral modification strategies today: increasing lean protein intake, planning for success and keeping a strict food journal.  Darl Fullerton has agreed to follow-up with our clinic in 2 weeks. She was informed of the importance of frequent follow-up visits to maximize her success with intensive lifestyle modifications for her multiple health conditions.   Objective:   Blood pressure 139/88, pulse 91, temperature 98.4 F (36.9 C), temperature source Oral, height 5\' 9"  (1.753 m), weight 236 lb (107 kg), last menstrual period 03/02/2019, SpO2 99 %. Body mass index is 34.85 kg/m.  General: Cooperative, alert, well developed, in no acute distress. HEENT: Conjunctivae and lids unremarkable. Neck: No thyromegaly.  Cardiovascular: Regular rhythm.  Lungs: Normal work of breathing. Extremities: No edema.  Neurologic: No focal deficits.   Lab Results  Component Value Date   CREATININE 1.10 (H) 03/08/2019   BUN 8 03/08/2019   NA 141 03/08/2019   K 4.1 03/08/2019   CL 105 03/08/2019   CO2 23 03/08/2019     Lab Results  Component Value Date   ALT 7 03/08/2019   AST 14 03/08/2019   ALKPHOS 128 (H) 03/08/2019   BILITOT 0.9 03/08/2019   Lab Results  Component Value Date   HGBA1C 5.3 03/08/2019   HGBA1C 5.3 11/02/2018   HGBA1C 5.3 04/22/2018   HGBA1C 5.5 12/29/2017   HGBA1C 5.8 08/07/2017   Lab Results  Component Value Date   INSULIN 4.0 03/08/2019   INSULIN 6.2 11/02/2018   INSULIN 11.9 04/22/2018   INSULIN 21.8 12/29/2017   Lab Results  Component Value Date   TSH 0.792 12/29/2017   Lab Results  Component Value Date   CHOL 216 (H) 03/08/2019   HDL 46 03/08/2019   LDLCALC 158 (H) 03/08/2019   TRIG 68 03/08/2019   CHOLHDL 6 08/07/2017   Lab Results  Component Value Date   WBC 4.8 03/08/2019   HGB 13.1 03/08/2019   HCT 40.2 03/08/2019   MCV 84 03/08/2019   PLT 339 03/08/2019   Lab Results  Component Value Date   IRON 54 03/08/2019   TIBC 343 03/08/2019   FERRITIN 13 (L) 03/08/2019   Attestation Statements:   Reviewed by clinician on day of visit: allergies, medications, problem list, medical history, surgical history, family history, social history, and previous encounter notes.  IMichaelene Song, am acting as Location manager for [employer], FNP  I have reviewed the above documentation for accuracy and completeness, and I agree with the above. -  Georgianne Fick, FNP

## 2019-04-07 DIAGNOSIS — F3341 Major depressive disorder, recurrent, in partial remission: Secondary | ICD-10-CM | POA: Diagnosis not present

## 2019-04-08 ENCOUNTER — Encounter (INDEPENDENT_AMBULATORY_CARE_PROVIDER_SITE_OTHER): Payer: Self-pay | Admitting: Family Medicine

## 2019-04-14 DIAGNOSIS — F3341 Major depressive disorder, recurrent, in partial remission: Secondary | ICD-10-CM | POA: Diagnosis not present

## 2019-04-19 ENCOUNTER — Ambulatory Visit (INDEPENDENT_AMBULATORY_CARE_PROVIDER_SITE_OTHER): Payer: BC Managed Care – PPO | Admitting: Family Medicine

## 2019-04-19 ENCOUNTER — Other Ambulatory Visit: Payer: Self-pay

## 2019-04-19 ENCOUNTER — Encounter (INDEPENDENT_AMBULATORY_CARE_PROVIDER_SITE_OTHER): Payer: Self-pay | Admitting: Family Medicine

## 2019-04-19 VITALS — BP 128/83 | HR 87 | Temp 98.2°F | Ht 69.0 in | Wt 240.0 lb

## 2019-04-19 DIAGNOSIS — F418 Other specified anxiety disorders: Secondary | ICD-10-CM | POA: Diagnosis not present

## 2019-04-19 DIAGNOSIS — R7303 Prediabetes: Secondary | ICD-10-CM | POA: Diagnosis not present

## 2019-04-19 DIAGNOSIS — Z6835 Body mass index (BMI) 35.0-35.9, adult: Secondary | ICD-10-CM

## 2019-04-19 DIAGNOSIS — Z9189 Other specified personal risk factors, not elsewhere classified: Secondary | ICD-10-CM

## 2019-04-19 DIAGNOSIS — D509 Iron deficiency anemia, unspecified: Secondary | ICD-10-CM | POA: Diagnosis not present

## 2019-04-19 MED ORDER — POLYSACCHARIDE IRON COMPLEX 150 MG PO CAPS: 150.0000 mg | ORAL_CAPSULE | Freq: Every day | ORAL | 0 refills | Status: DC

## 2019-04-19 MED ORDER — METFORMIN HCL 500 MG PO TABS: 500.0000 mg | ORAL_TABLET | Freq: Every day | ORAL | 0 refills | Status: DC

## 2019-04-19 MED ORDER — BUPROPION HCL ER (SR) 150 MG PO TB12: 150.0000 mg | ORAL_TABLET | Freq: Two times a day (BID) | ORAL | 0 refills | Status: DC

## 2019-04-20 NOTE — Progress Notes (Signed)
Chief Complaint:   OBESITY Shannon [name] is here to discuss her progress with her obesity treatment plan along with follow-up of her obesity related diagnoses. Shannon [name] is on keeping a food journal and adhering to recommended goals of 1300-1400 calories and 90-100 grams of protein and states she is following her eating plan approximately 50% of the time. Shannon [name] states she is exercising 0 minutes 0 times per week.  Today's visit was #: 28 Starting weight: 263 lbs Starting date: 12/29/2017 Today's weight: 240 lbs Today's date: 04/19/2019 Total lbs lost to date: 23 Total lbs lost since last in-office visit: 0  Interim History: Shannon [name] has been struggling with journaling all of her food since the holidays. She has gained a bit, but is ready to get back on track and feels she will do better with a more structured plan.  Subjective:   Iron deficiency anemia, unspecified iron deficiency anemia type. Shannon [name] has been stable on Niferex. She is not struggling with constipation.  CBC Latest Ref Rng & Units 03/08/2019 04/22/2018 12/29/2017  WBC 3.4 - 10.8 x10E3/uL 4.8 5.0 5.1  Hemoglobin 11.1 - 15.9 g/dL 13.1 11.9 12.2  Hematocrit 34.0 - 46.6 % 40.2 37.9 37.9  Platelets 150 - 450 x10E3/uL 339 - -   Lab Results  Component Value Date   IRON 54 03/08/2019   TIBC 343 03/08/2019   FERRITIN 13 (L) 03/08/2019   Lab Results  Component Value Date   VITAMINB12 624 03/08/2019   Prediabetes. Shannon [name] has a diagnosis of prediabetes based on her elevated HgA1c and was informed this puts her at greater risk of developing diabetes. She continues to work on diet and exercise to decrease her risk of diabetes. She denies nausea, vomiting, or hypoglycemia. Shannon [name] stable on metformin.  Lab Results  Component Value Date   HGBA1C 5.3 03/08/2019   Lab Results  Component Value Date   INSULIN 4.0 03/08/2019   INSULIN 6.2 11/02/2018   INSULIN 11.9 04/22/2018   INSULIN 21.8 12/29/2017   Depression with  anxiety. Shannon [name] is struggling with emotional eating and using food for comfort to the extent that it is negatively impacting her health. She has been working on behavior modification techniques to help reduce her emotional eating and has been somewhat successful. She shows no sign of suicidal or homicidal ideations. Shannon [name] is stable on Wellbutrin, is sleeping well and blood pressure is stable. She requests a refill.  At risk for dehydration. [name] is at risk for dehydration due to weight loss and current medications.  Assessment/Plan:   Iron deficiency anemia, unspecified iron deficiency anemia type. Orders and follow up as documented in patient record. Refill was given for iron polysaccharides (NU-IRON) 150 MG capsule x1 month. She will continue an iron rich diet.  Counseling . Iron is essential for our bodies to make red blood cells.  Reasons that someone may be deficient include: an iron-deficient diet (more likely in those following vegan or vegetarian diets), women with heavy menses, patients with GI disorders or poor absorption, patients that have had bariatric surgery, frequent blood donors, patients with cancer, and patients with heart disease.   Shannon [name] Kitchen An iron supplement has been recommended. This is found over-the-counter.  Shannon [name] foods include dark leafy greens, red and white meats, eggs, seafood, and beans.   . Certain foods and drinks prevent your body from absorbing iron properly. Avoid eating these foods in the same meal as iron-rich foods or with iron supplements. These foods include: coffee, black tea,  and red wine; milk, dairy products, and foods that are high in calcium; beans and soybeans; whole grains.  Constipation can be a side effect of iron supplementation. Increased water and fiber intake are helpful. Water goal: > 2 liters/day. Fiber goal: > 25 grams/day.   Prediabetes. Shannon [name] will continue to work on weight loss, exercise, and decreasing simple carbohydrates to help  decrease the risk of diabetes. Refill was given for metFORMIN (GLUCOPHAGE) 500 MG tablet x1 month. She will get back to her diet prescription.  Depression with anxiety. Behavior modification techniques were discussed today to help Shannon [name] deal with her emotional/non-hunger eating behaviors.  Orders and follow up as documented in patient record. Refill was given for buPROPion (WELLBUTRIN SR) 150 MG 12 hr tablet x1 month.  At risk for dehydration. Shannon [name] was given approximately 15 minutes dehydration prevention counseling today. Shannon [name] is at risk for dehydration due to weight loss and current medication(s). She was encouraged to hydrate and monitor fluid status to avoid dehydration as well as weight loss plateaus.   Class 2 severe obesity with serious comorbidity and body mass index (BMI) of 35.0 to 35.9 in adult, unspecified obesity type (Shannon [name]).  Shannon [name] is currently in the action stage of change. As such, her goal is to continue with weight loss efforts. She has agreed to change plans and will now follow the Category 3 Plan. Lean meat equivalents were discussed.  Behavioral modification strategies: increasing water intake and meal planning and cooking strategies.  Shannon [name] has agreed to follow-up with our clinic in 2 weeks. She was informed of the importance of frequent follow-up visits to maximize her success with intensive lifestyle modifications for her multiple health conditions.   Objective:   Blood pressure 128/83, pulse 87, temperature 98.2 F (36.8 C), temperature source Oral, height 5\' 9"  (1.753 m), weight 240 lb (108.9 kg), SpO2 100 %. Body mass index is 35.44 kg/m.  General: Cooperative, alert, well developed, in no acute distress. HEENT: Conjunctivae and lids unremarkable. Cardiovascular: Regular rhythm.  Lungs: Normal work of breathing. Neurologic: No focal deficits.   Lab Results  Component Value Date   CREATININE 1.10 (H) 03/08/2019   BUN 8 03/08/2019   NA 141 03/08/2019   K  4.1 03/08/2019   CL 105 03/08/2019   CO2 23 03/08/2019   Lab Results  Component Value Date   ALT 7 03/08/2019   AST 14 03/08/2019   ALKPHOS 128 (H) 03/08/2019   BILITOT 0.9 03/08/2019   Lab Results  Component Value Date   HGBA1C 5.3 03/08/2019   HGBA1C 5.3 11/02/2018   HGBA1C 5.3 04/22/2018   HGBA1C 5.5 12/29/2017   HGBA1C 5.8 08/07/2017   Lab Results  Component Value Date   INSULIN 4.0 03/08/2019   INSULIN 6.2 11/02/2018   INSULIN 11.9 04/22/2018   INSULIN 21.8 12/29/2017   Lab Results  Component Value Date   TSH 0.792 12/29/2017   Lab Results  Component Value Date   CHOL 216 (H) 03/08/2019   HDL 46 03/08/2019   LDLCALC 158 (H) 03/08/2019   TRIG 68 03/08/2019   CHOLHDL 6 08/07/2017   Lab Results  Component Value Date   WBC 4.8 03/08/2019   HGB 13.1 03/08/2019   HCT 40.2 03/08/2019   MCV 84 03/08/2019   PLT 339 03/08/2019   Lab Results  Component Value Date   IRON 54 03/08/2019   TIBC 343 03/08/2019   FERRITIN 13 (L) 03/08/2019   Attestation Statements:   Reviewed by clinician on day  of visit: allergies, medications, problem list, medical history, surgical history, family history, social history, and previous encounter notes.  I, Michaelene Song, am acting as Location manager for Dennard Nip, MD   I have reviewed the above documentation for accuracy and completeness, and I agree with the above. -  Dennard Nip, MD

## 2019-04-21 DIAGNOSIS — F3341 Major depressive disorder, recurrent, in partial remission: Secondary | ICD-10-CM | POA: Diagnosis not present

## 2019-04-28 DIAGNOSIS — F3341 Major depressive disorder, recurrent, in partial remission: Secondary | ICD-10-CM | POA: Diagnosis not present

## 2019-05-03 ENCOUNTER — Other Ambulatory Visit: Payer: Self-pay

## 2019-05-03 ENCOUNTER — Encounter (INDEPENDENT_AMBULATORY_CARE_PROVIDER_SITE_OTHER): Payer: Self-pay | Admitting: Family Medicine

## 2019-05-03 ENCOUNTER — Ambulatory Visit (INDEPENDENT_AMBULATORY_CARE_PROVIDER_SITE_OTHER): Payer: BC Managed Care – PPO | Admitting: Family Medicine

## 2019-05-03 VITALS — BP 140/93 | HR 88 | Temp 98.6°F | Ht 69.0 in | Wt 239.0 lb

## 2019-05-03 DIAGNOSIS — Z6835 Body mass index (BMI) 35.0-35.9, adult: Secondary | ICD-10-CM

## 2019-05-03 DIAGNOSIS — D508 Other iron deficiency anemias: Secondary | ICD-10-CM

## 2019-05-03 DIAGNOSIS — F418 Other specified anxiety disorders: Secondary | ICD-10-CM | POA: Diagnosis not present

## 2019-05-03 DIAGNOSIS — Z9189 Other specified personal risk factors, not elsewhere classified: Secondary | ICD-10-CM | POA: Diagnosis not present

## 2019-05-03 MED ORDER — BUPROPION HCL ER (SR) 150 MG PO TB12: 150.0000 mg | ORAL_TABLET | Freq: Two times a day (BID) | ORAL | 0 refills | Status: DC

## 2019-05-04 ENCOUNTER — Encounter (INDEPENDENT_AMBULATORY_CARE_PROVIDER_SITE_OTHER): Payer: Self-pay | Admitting: Family Medicine

## 2019-05-04 NOTE — Progress Notes (Signed)
Chief Complaint:   OBESITY Shannon [name] is here to discuss her progress with her obesity treatment plan along with follow-up of her obesity related diagnoses. [name] is on the Category 3 Plan and states she is following her eating plan approximately 50% of the time. [name] states she is exercising with the elliptical 45 minutes 2 times per week.  Today's visit was #: 65 Starting weight: 263 lbs Starting date: 12/29/2017 Today's weight: 239 lbs Today's date: 05/03/2019 Total lbs lost to date: 24 Total lbs lost since last in-office visit: 1  Interim History: [name] is having trouble with her refrigerator and it freezes all of her food. This is making it more difficult to stick to plan. She reports snacking on popcorn and [employer] ice cream rather than eating lunch on work days. She is often skipping 2 out of 3 meals on the weekends.  Subjective:   Other iron deficiency anemia [name] is on prescription iron, but she would like to switch to OTC. Her last ferritin was low at 13 (03/08/19). Her hemoglobin and hematocrit were within normal limits.  CBC Latest Ref Rng & Units 03/08/2019 04/22/2018 12/29/2017  WBC 3.4 - 10.8 x10E3/uL 4.8 5.0 5.1  Hemoglobin 11.1 - 15.9 g/dL 13.1 11.9 12.2  Hematocrit 34.0 - 46.6 % 40.2 37.9 37.9  Platelets 150 - 450 x10E3/uL 339 - -   Lab Results  Component Value Date   IRON 54 03/08/2019   TIBC 343 03/08/2019   FERRITIN 13 (L) 03/08/2019   Lab Results  Component Value Date   VITAMINB12 624 03/08/2019   Depression with anxiety Terrylynn sees a counselor weekly. She reports worsening depression and increase in sleeping. She shows no signs of suicidal or homicidal ideations. Simmie will be seeing the psychiatrist May 30, 2019. She has not seen a psychiatrist in several months.   At risk for constipation Lillyian is at increased risk for constipation due to inadequate water intake, changes in diet, and/or use of medications such as iron. Virgie denies  hard, infrequent stools currently.   Assessment/Plan:   Other iron deficiency anemia [name] will start OTC iron 65 mg elemental daily and discontinue prescription iron. Orders and follow up as documented in patient record.  Depression with anxiety  [name] agrees to continue buPROPion (WELLBUTRIN SR) 150 MG 12 hr tablet two times daily #60 with no refills.  At risk for constipation [name] was given approximately 15 minutes of counseling today regarding prevention of constipation. She was encouraged to increase water and fiber intake.   Class 2 severe obesity with serious comorbidity and body mass index (BMI) of 35.0 to 35.9 in adult, unspecified obesity type [facility]) [name] is currently in the action stage of change. As such, her goal is to continue with weight loss efforts. She has agreed to the Category 3 Plan.   Exercise goals: [name] will continue her current exercise regimen.  Behavioral modification strategies: increasing lean protein intake, decreasing simple carbohydrates and no skipping meals. [name] will eat lunch at least 4 out of 5 work days. She will eat at least 2 meals on her days off.  [name] has agreed to follow-up with our clinic in 2 weeks. She was informed of the importance of frequent follow-up visits to maximize her success with intensive lifestyle modifications for her multiple health conditions.   Objective:   Blood pressure (!) 140/93, pulse 88, temperature 98.6 F (37 C), temperature source Oral, height 5\' 9"  (1.753 m), weight 239 lb (108.4 kg),  SpO2 100 %. Body mass index is 35.29 kg/m.  General: Cooperative, alert, well developed, in no acute distress. HEENT: Conjunctivae and lids unremarkable. Cardiovascular: Regular rhythm.  Lungs: Normal work of breathing. Neurologic: No focal deficits.   Lab Results  Component Value Date   CREATININE 1.10 (H) 03/08/2019   BUN 8 03/08/2019   NA 141 03/08/2019   K 4.1 03/08/2019   CL 105 03/08/2019   CO2 23  03/08/2019   Lab Results  Component Value Date   ALT 7 03/08/2019   AST 14 03/08/2019   ALKPHOS 128 (H) 03/08/2019   BILITOT 0.9 03/08/2019   Lab Results  Component Value Date   HGBA1C 5.3 03/08/2019   HGBA1C 5.3 11/02/2018   HGBA1C 5.3 04/22/2018   HGBA1C 5.5 12/29/2017   HGBA1C 5.8 08/07/2017   Lab Results  Component Value Date   INSULIN 4.0 03/08/2019   INSULIN 6.2 11/02/2018   INSULIN 11.9 04/22/2018   INSULIN 21.8 12/29/2017   Lab Results  Component Value Date   TSH 0.792 12/29/2017   Lab Results  Component Value Date   CHOL 216 (H) 03/08/2019   HDL 46 03/08/2019   LDLCALC 158 (H) 03/08/2019   TRIG 68 03/08/2019   CHOLHDL 6 08/07/2017   Lab Results  Component Value Date   WBC 4.8 03/08/2019   HGB 13.1 03/08/2019   HCT 40.2 03/08/2019   MCV 84 03/08/2019   PLT 339 03/08/2019   Lab Results  Component Value Date   IRON 54 03/08/2019   TIBC 343 03/08/2019   FERRITIN 13 (L) 03/08/2019    Ref. Range 03/08/2019 16:54  Vitamin D, 25-Hydroxy Latest Ref Range: 30.0 - 100.0 ng/mL 55.1   Attestation Statements:   Reviewed by clinician on day of visit: allergies, medications, problem list, medical history, surgical history, family history, social history, and previous encounter notes.  Corey Skains, am acting as Location manager for [employer], FNP-C.  I have reviewed the above documentation for accuracy and completeness, and I agree with the above. -  Collette Pescador [employer], FNP-C

## 2019-05-05 DIAGNOSIS — F3341 Major depressive disorder, recurrent, in partial remission: Secondary | ICD-10-CM | POA: Diagnosis not present

## 2019-05-12 DIAGNOSIS — F3341 Major depressive disorder, recurrent, in partial remission: Secondary | ICD-10-CM | POA: Diagnosis not present

## 2019-05-19 ENCOUNTER — Ambulatory Visit (INDEPENDENT_AMBULATORY_CARE_PROVIDER_SITE_OTHER): Payer: BC Managed Care – PPO | Admitting: Family Medicine

## 2019-05-19 ENCOUNTER — Encounter (INDEPENDENT_AMBULATORY_CARE_PROVIDER_SITE_OTHER): Payer: Self-pay | Admitting: Family Medicine

## 2019-05-19 ENCOUNTER — Other Ambulatory Visit: Payer: Self-pay

## 2019-05-19 VITALS — BP 119/83 | HR 85 | Temp 98.7°F | Ht 69.0 in | Wt 237.0 lb

## 2019-05-19 DIAGNOSIS — F418 Other specified anxiety disorders: Secondary | ICD-10-CM

## 2019-05-19 DIAGNOSIS — Z6835 Body mass index (BMI) 35.0-35.9, adult: Secondary | ICD-10-CM

## 2019-05-19 DIAGNOSIS — F3341 Major depressive disorder, recurrent, in partial remission: Secondary | ICD-10-CM | POA: Diagnosis not present

## 2019-05-19 MED ORDER — IRON 325 (65 FE) MG PO TABS: 1.0000 | ORAL_TABLET | Freq: Every day | ORAL | 0 refills | Status: DC

## 2019-05-20 NOTE — Progress Notes (Signed)
Chief Complaint:   OBESITY Shannon [name] is here to discuss her progress with her obesity treatment plan along with follow-up of her obesity related diagnoses. Shannon [name] is on the Category 3 Plan and states she is following her eating plan approximately 50% of the time. Shannon [name] states she is exercising 0 minutes 0 times per week.  Today's visit was #: 82 Starting weight: 263 lbs Starting date: 12/29/2017 Today's weight: 237 lbs Today's date: 05/19/2019 Total lbs lost to date: 26 Total lbs lost since last in-office visit: 2  Interim History: Shannon [name] is now eating at least two meals on weekend days as advised at last visit. She is now eating lunch regularly on her work days. Shannon [name] has decreased her snacking since she has been eating more meals. She has not been exercising.  Subjective:   Depression with anxiety Shannon [name] is on Bupropion 150 mg two times a day. She reports a somewhat depressed mood which she notes is usual for her during the winter months with shorter days. She is looking forward to more daylight soon and being able to be outside more. She is seeing a counselor regularly but has not seen a psychiatrist recently for medication management. She was scheduled for a psychiatry visit but the visit was cancelled by the office. She is going to call to reschedule.  Assessment/Plan:   Depression with anxiety Shannon [name] will continue Bupropion and she will reschedule her psychiatry appointment.   Class 2 severe obesity with serious comorbidity and body mass index (BMI) of 35.0 to 35.9 in adult, unspecified obesity type [facility]) Shannon [name] is currently in the action stage of change. As such, her goal is to continue with weight loss efforts. She has agreed to the Category 3 Plan.   Exercise goals: walk for 30 minutes, 2 times per week.  Behavioral modification strategies: no skipping meals.  Shannon [name] has agreed to follow-up with our clinic in 3 weeks. She was informed of the importance of  frequent follow-up visits to maximize her success with intensive lifestyle modifications for her multiple health conditions.   Objective:   Blood pressure 119/83, pulse 85, temperature 98.7 F (37.1 C), temperature source Oral, height 5\' 9"  (1.753 m), weight 237 lb (107.5 kg), SpO2 100 %. Body mass index is 35 kg/m.  General: Cooperative, alert, well developed, in no acute distress. HEENT: Conjunctivae and lids unremarkable. Cardiovascular: Regular rhythm.  Lungs: Normal work of breathing. Neurologic: No focal deficits.   Lab Results  Component Value Date   CREATININE 1.10 (H) 03/08/2019   BUN 8 03/08/2019   NA 141 03/08/2019   K 4.1 03/08/2019   CL 105 03/08/2019   CO2 23 03/08/2019   Lab Results  Component Value Date   ALT 7 03/08/2019   AST 14 03/08/2019   ALKPHOS 128 (H) 03/08/2019   BILITOT 0.9 03/08/2019   Lab Results  Component Value Date   HGBA1C 5.3 03/08/2019   HGBA1C 5.3 11/02/2018   HGBA1C 5.3 04/22/2018   HGBA1C 5.5 12/29/2017   HGBA1C 5.8 08/07/2017   Lab Results  Component Value Date   INSULIN 4.0 03/08/2019   INSULIN 6.2 11/02/2018   INSULIN 11.9 04/22/2018   INSULIN 21.8 12/29/2017   Lab Results  Component Value Date   TSH 0.792 12/29/2017   Lab Results  Component Value Date   CHOL 216 (H) 03/08/2019   HDL 46 03/08/2019   LDLCALC 158 (H) 03/08/2019   TRIG 68 03/08/2019   CHOLHDL 6 08/07/2017   Lab  Results  Component Value Date   WBC 4.8 03/08/2019   HGB 13.1 03/08/2019   HCT 40.2 03/08/2019   MCV 84 03/08/2019   PLT 339 03/08/2019   Lab Results  Component Value Date   IRON 54 03/08/2019   TIBC 343 03/08/2019   FERRITIN 13 (L) 03/08/2019    Ref. Range 03/08/2019 16:54  Vitamin D, 25-Hydroxy Latest Ref Range: 30.0 - 100.0 ng/mL 55.1    Attestation Statements:   Reviewed by clinician on day of visit: allergies, medications, problem list, medical history, surgical history, family history, social history, and previous encounter  notes.  Corey Skains, am acting as Location manager for [employer], FNP-C.  I have reviewed the above documentation for accuracy and completeness, and I agree with the above. -  Aunna Snooks [employer], FNP-C

## 2019-05-26 DIAGNOSIS — F3341 Major depressive disorder, recurrent, in partial remission: Secondary | ICD-10-CM | POA: Diagnosis not present

## 2019-06-01 DIAGNOSIS — F3341 Major depressive disorder, recurrent, in partial remission: Secondary | ICD-10-CM | POA: Diagnosis not present

## 2019-06-09 DIAGNOSIS — F3341 Major depressive disorder, recurrent, in partial remission: Secondary | ICD-10-CM | POA: Diagnosis not present

## 2019-06-10 ENCOUNTER — Other Ambulatory Visit: Payer: Self-pay

## 2019-06-10 ENCOUNTER — Ambulatory Visit (INDEPENDENT_AMBULATORY_CARE_PROVIDER_SITE_OTHER): Payer: BC Managed Care – PPO | Admitting: Family Medicine

## 2019-06-10 ENCOUNTER — Encounter (INDEPENDENT_AMBULATORY_CARE_PROVIDER_SITE_OTHER): Payer: Self-pay | Admitting: Family Medicine

## 2019-06-10 VITALS — BP 134/77 | HR 89 | Temp 98.3°F | Ht 69.0 in | Wt 243.0 lb

## 2019-06-10 DIAGNOSIS — Z9189 Other specified personal risk factors, not elsewhere classified: Secondary | ICD-10-CM | POA: Diagnosis not present

## 2019-06-10 DIAGNOSIS — F418 Other specified anxiety disorders: Secondary | ICD-10-CM

## 2019-06-10 DIAGNOSIS — Z6835 Body mass index (BMI) 35.0-35.9, adult: Secondary | ICD-10-CM

## 2019-06-10 DIAGNOSIS — R7303 Prediabetes: Secondary | ICD-10-CM

## 2019-06-10 MED ORDER — BUPROPION HCL ER (SR) 150 MG PO TB12: 150.0000 mg | ORAL_TABLET | Freq: Two times a day (BID) | ORAL | 0 refills | Status: DC

## 2019-06-10 MED ORDER — METFORMIN HCL 500 MG PO TABS: 500.0000 mg | ORAL_TABLET | Freq: Two times a day (BID) | ORAL | 0 refills | Status: DC

## 2019-06-14 ENCOUNTER — Encounter (INDEPENDENT_AMBULATORY_CARE_PROVIDER_SITE_OTHER): Payer: Self-pay | Admitting: Family Medicine

## 2019-06-14 DIAGNOSIS — R7303 Prediabetes: Secondary | ICD-10-CM | POA: Insufficient documentation

## 2019-06-14 NOTE — Progress Notes (Signed)
Chief Complaint:   OBESITY Shannon [name] is here to discuss her progress with her obesity treatment plan along with follow-up of her obesity related diagnoses. Shannon [name] is on the Category 3 Plan and states she is following her eating plan approximately 25% of the time. [name] states she is exercising 0 minutes 0 times per week.  Today's visit was #: 44 Starting weight: 263 lbs Starting date: 12/29/2017 Today's weight: 243 lbs Today's date: 06/10/2019 Total lbs lost to date: 20 Total lbs lost since last in-office visit: 0  Interim History: [name] continues to have only one meal a day on weekends (breakfast). She notes that she adheres to the plan 3 days per week. She gets hungry quickly after meals. She admits to eating mostly popcorn some days.  Subjective:   Prediabetes. [name] has a diagnosis of prediabetes based on her elevated HgA1c and was informed this puts her at greater risk of developing diabetes. She continues to work on diet and exercise to decrease her risk of diabetes. She denies nausea or hypoglycemia. [name] reports polyphagia soon after meals. She is on metformin once daily.  Lab Results  Component Value Date   HGBA1C 5.3 03/08/2019   Lab Results  Component Value Date   INSULIN 4.0 03/08/2019   INSULIN 6.2 11/02/2018   INSULIN 11.9 04/22/2018   INSULIN 21.8 12/29/2017   Depression with anxiety. Shannon [name] is struggling with emotional eating and using food for comfort to the extent that it is negatively impacting her health. She has been working on behavior modification techniques to help reduce her emotional eating and has been somewhat successful. She shows no sign of suicidal or homicidal ideations. Shannon [name] has a psychiatrist appointment on 06/22/2019. She has not followed up with psychiatry for several months now. Mood is stable. She is seeing a Social worker weekly.  At risk for diabetes mellitus. Shannon [name] is at higher than average risk for developing diabetes due to her  obesity and prediabetic status.   Assessment/Plan:   Prediabetes. Shannon [name] will continue to work on weight loss, exercise, and decreasing simple carbohydrates to help decrease the risk of diabetes. She will increase her dose of metformin and a prescription was given for metFORMIN (GLUCOPHAGE) 500 MG tablet BID #60 with 0 refills.  Depression with anxiety. Behavior modification techniques were discussed today to help Shannon [name] deal with her emotional/non-hunger eating behaviors.  Orders and follow up as documented in patient record. Zekia was given a refill on her buPROPion (WELLBUTRIN SR) 150 MG 12 hr tablet BID #60 with 0 refills. She will continue to see her counselor and psychiatrist.  At risk for diabetes mellitus. Shannon [name] was given approximately 15 minutes of diabetes education and counseling today. We discussed intensive lifestyle modifications today with an emphasis on weight loss as well as increasing exercise and decreasing simple carbohydrates in her diet. We also reviewed medication options with an emphasis on risk versus benefit of those discussed.   Repetitive spaced learning was employed today to elicit superior memory formation and behavioral change.  Class 2 severe obesity with serious comorbidity and body mass index (BMI) of 35.0 to 35.9 in adult, unspecified obesity type ([place]).  Shannon [name] is currently in the action stage of change. As such, her goal is to continue with weight loss efforts. She has agreed to the Category 3 Plan.   She will stick to the plan at least Monday through Friday. She will add a meal on weekends with 20 grams of protein.  Exercise goals: [name]  will increase her walking.  Behavioral modification strategies: increasing lean protein intake, decreasing simple carbohydrates and no skipping meals.  Shannon [name] has agreed to follow-up with our clinic in 3 weeks. She was informed of the importance of frequent follow-up visits to maximize her success with intensive  lifestyle modifications for her multiple health conditions.   Objective:   Blood pressure 134/77, pulse 89, temperature 98.3 F (36.8 C), temperature source Oral, height 5\' 9"  (1.753 m), weight 243 lb (110.2 kg), SpO2 100 %. Body mass index is 35.88 kg/m.  General: Cooperative, alert, well developed, in no acute distress. HEENT: Conjunctivae and lids unremarkable. Cardiovascular: Regular rhythm.  Lungs: Normal work of breathing. Neurologic: No focal deficits.   Lab Results  Component Value Date   CREATININE 1.10 (H) 03/08/2019   BUN 8 03/08/2019   NA 141 03/08/2019   K 4.1 03/08/2019   CL 105 03/08/2019   CO2 23 03/08/2019   Lab Results  Component Value Date   ALT 7 03/08/2019   AST 14 03/08/2019   ALKPHOS 128 (H) 03/08/2019   BILITOT 0.9 03/08/2019   Lab Results  Component Value Date   HGBA1C 5.3 03/08/2019   HGBA1C 5.3 11/02/2018   HGBA1C 5.3 04/22/2018   HGBA1C 5.5 12/29/2017   HGBA1C 5.8 08/07/2017   Lab Results  Component Value Date   INSULIN 4.0 03/08/2019   INSULIN 6.2 11/02/2018   INSULIN 11.9 04/22/2018   INSULIN 21.8 12/29/2017   Lab Results  Component Value Date   TSH 0.792 12/29/2017   Lab Results  Component Value Date   CHOL 216 (H) 03/08/2019   HDL 46 03/08/2019   LDLCALC 158 (H) 03/08/2019   TRIG 68 03/08/2019   CHOLHDL 6 08/07/2017   Lab Results  Component Value Date   WBC 4.8 03/08/2019   HGB 13.1 03/08/2019   HCT 40.2 03/08/2019   MCV 84 03/08/2019   PLT 339 03/08/2019   Lab Results  Component Value Date   IRON 54 03/08/2019   TIBC 343 03/08/2019   FERRITIN 13 (L) 03/08/2019   Attestation Statements:   Reviewed by clinician on day of visit: allergies, medications, problem list, medical history, surgical history, family history, social history, and previous encounter notes.  IMichaelene Song, am acting as Location manager for [employer], FNP   I have reviewed the above documentation for accuracy and completeness, and I  agree with the above. -  Georgianne Fick, FNP

## 2019-06-16 DIAGNOSIS — F3341 Major depressive disorder, recurrent, in partial remission: Secondary | ICD-10-CM | POA: Diagnosis not present

## 2019-06-22 DIAGNOSIS — F332 Major depressive disorder, recurrent severe without psychotic features: Secondary | ICD-10-CM | POA: Diagnosis not present

## 2019-06-23 DIAGNOSIS — F3341 Major depressive disorder, recurrent, in partial remission: Secondary | ICD-10-CM | POA: Diagnosis not present

## 2019-06-30 DIAGNOSIS — F3341 Major depressive disorder, recurrent, in partial remission: Secondary | ICD-10-CM | POA: Diagnosis not present

## 2019-07-01 ENCOUNTER — Ambulatory Visit (INDEPENDENT_AMBULATORY_CARE_PROVIDER_SITE_OTHER): Payer: BC Managed Care – PPO | Admitting: Family Medicine

## 2019-07-03 ENCOUNTER — Ambulatory Visit: Payer: BC Managed Care – PPO | Attending: Internal Medicine

## 2019-07-03 DIAGNOSIS — Z23 Encounter for immunization: Secondary | ICD-10-CM

## 2019-07-03 NOTE — Progress Notes (Signed)
   Covid-19 Vaccination Clinic  Name:  Shannon [name]    MRN: [id number] DOB: [birth year]  07/03/2019  [name] was observed post Covid-19 immunization for 15 minutes without incident. She was provided with Vaccine Information Sheet and instruction to access the V-Safe system.   [name] was instructed to call 911 with any severe reactions post vaccine: [name] Difficulty breathing  . Swelling of face and throat  . A fast heartbeat  . A bad rash all over body  . Dizziness and weakness   Immunizations Administered    Name Date Dose VIS Date Route   Pfizer COVID-19 Vaccine 07/03/2019  4:13 PM 0.3 mL 03/05/2019 Intramuscular   Manufacturer: [place]   Lot: [id number]   [place]: [id number]

## 2019-07-07 DIAGNOSIS — F3341 Major depressive disorder, recurrent, in partial remission: Secondary | ICD-10-CM | POA: Diagnosis not present

## 2019-07-13 ENCOUNTER — Encounter (INDEPENDENT_AMBULATORY_CARE_PROVIDER_SITE_OTHER): Payer: Self-pay | Admitting: Family Medicine

## 2019-07-13 ENCOUNTER — Ambulatory Visit (INDEPENDENT_AMBULATORY_CARE_PROVIDER_SITE_OTHER): Payer: BC Managed Care – PPO | Admitting: Family Medicine

## 2019-07-13 ENCOUNTER — Other Ambulatory Visit: Payer: Self-pay

## 2019-07-13 VITALS — BP 128/87 | HR 98 | Temp 98.1°F | Ht 69.0 in | Wt 245.0 lb

## 2019-07-13 DIAGNOSIS — E8881 Metabolic syndrome: Secondary | ICD-10-CM | POA: Diagnosis not present

## 2019-07-13 DIAGNOSIS — Z9189 Other specified personal risk factors, not elsewhere classified: Secondary | ICD-10-CM | POA: Diagnosis not present

## 2019-07-13 DIAGNOSIS — Z6836 Body mass index (BMI) 36.0-36.9, adult: Secondary | ICD-10-CM

## 2019-07-13 DIAGNOSIS — F418 Other specified anxiety disorders: Secondary | ICD-10-CM | POA: Diagnosis not present

## 2019-07-13 MED ORDER — METFORMIN HCL 500 MG PO TABS: 500.0000 mg | ORAL_TABLET | Freq: Every morning | ORAL | 0 refills | Status: DC

## 2019-07-14 ENCOUNTER — Encounter (INDEPENDENT_AMBULATORY_CARE_PROVIDER_SITE_OTHER): Payer: Self-pay | Admitting: Family Medicine

## 2019-07-14 DIAGNOSIS — F3341 Major depressive disorder, recurrent, in partial remission: Secondary | ICD-10-CM | POA: Diagnosis not present

## 2019-07-14 NOTE — Progress Notes (Signed)
Chief Complaint:   OBESITY Shannon [name] is here to discuss her progress with her obesity treatment plan along with follow-up of her obesity related diagnoses. Shannon [name] is on the Category 3 Plan and states she is following her eating plan approximately 50% of the time. Shannon [name] states she is doing Education administrator  for 30 minutes 3 times per week.  Today's visit was #: 41 Starting weight: 263 lbs Starting date: 12/29/2017 Today's weight: 245 lbs Today's date: 07/13/2019 Total lbs lost to date: 18 Total lbs lost since last in-office visit: 0  Interim History: Shannon [name] reports she has not been sticking to the plan well. She is skipping meals. She is still eating popcorn rather than meals at times. She is up 12 lbs since December 2020.  Subjective:   1. Depression with anxiety Shannon [name] notes feeling better overall with the warmer weather. She saw a psychiatrist recently and there were no medication changes. Her psychiatrist thinks she has seasonal affective disorder. She also sees a Social worker regularly.  2. Insulin resistance Shannon [name] does not feel metformin helps with her appetite. She was pre-diabetic previously. Lab Results  Component Value Date   HGBA1C 5.3 03/08/2019    3. At risk for diabetes mellitus Shannon [name] is at higher than average risk for developing diabetes due to her obesity and insulin resistance.  Assessment/Plan:   1. Depression with anxiety Behavior modification techniques were discussed today to help Shannon [name] deal with her depression and anxiety. She will continue to follow up with Psychiatry and will continue bupropion.   2. Insulin resistance Shannon [name] will continue to work on weight loss, exercise, and decreasing simple carbohydrates to help decrease the risk of diabetes. Shannon [name] agreed to decrease metformin to 500 mg q daily and we will refill for 1 month. Shannon [name] agreed to follow-up with [place] as directed to closely monitor her progress.  - metFORMIN (GLUCOPHAGE) 500 MG tablet; Take 1  tablet (500 mg total) by mouth in the morning.  Dispense: 30 tablet; Refill: 0  3. At risk for diabetes mellitus Shannon [name] was given approximately 15 minutes of diabetes education and counseling today. We discussed intensive lifestyle modifications today with an emphasis on weight loss as well as increasing exercise and decreasing simple carbohydrates in her diet. We also reviewed medication options with an emphasis on risk versus benefit of those discussed.   Repetitive spaced learning was employed today to elicit superior memory formation and behavioral change.  4. Class 2 severe obesity with serious comorbidity and body mass index (BMI) of 36.0 to 36.9 in adult, unspecified obesity type Shannon [name]) Shannon [name] is currently in the action stage of change. As such, her goal is to continue with weight loss efforts. She has agreed to keeping a food journal and adhering to recommended goals of 1300-1400 calories and 85-90 grams of protein daily.   Shannon [name] will journal at least 5 days out of 7 days.  Exercise goals: As is.  Behavioral modification strategies: increasing lean protein intake, no skipping meals and planning for success.  Shannon [name] has agreed to follow-up with our clinic in 4 weeks. She was informed of the importance of frequent follow-up visits to maximize her success with intensive lifestyle modifications for her multiple health conditions.   Objective:   Blood pressure 128/87, pulse 98, temperature 98.1 F (36.7 C), temperature source Oral, height 5\' 9"  (1.753 m), weight 245 lb (111.1 kg), SpO2 100 %. Body mass index is 36.18 kg/m.  General: Cooperative, alert, well developed, in no acute distress. HEENT:  Conjunctivae and lids unremarkable. Cardiovascular: Regular rhythm.  Lungs: Normal work of breathing. Neurologic: No focal deficits.   Lab Results  Component Value Date   CREATININE 1.10 (H) 03/08/2019   BUN 8 03/08/2019   NA 141 03/08/2019   K 4.1 03/08/2019   CL 105 03/08/2019    CO2 23 03/08/2019   Lab Results  Component Value Date   ALT 7 03/08/2019   AST 14 03/08/2019   ALKPHOS 128 (H) 03/08/2019   BILITOT 0.9 03/08/2019   Lab Results  Component Value Date   HGBA1C 5.3 03/08/2019   HGBA1C 5.3 11/02/2018   HGBA1C 5.3 04/22/2018   HGBA1C 5.5 12/29/2017   HGBA1C 5.8 08/07/2017   Lab Results  Component Value Date   INSULIN 4.0 03/08/2019   INSULIN 6.2 11/02/2018   INSULIN 11.9 04/22/2018   INSULIN 21.8 12/29/2017   Lab Results  Component Value Date   TSH 0.792 12/29/2017   Lab Results  Component Value Date   CHOL 216 (H) 03/08/2019   HDL 46 03/08/2019   LDLCALC 158 (H) 03/08/2019   TRIG 68 03/08/2019   CHOLHDL 6 08/07/2017   Lab Results  Component Value Date   WBC 4.8 03/08/2019   HGB 13.1 03/08/2019   HCT 40.2 03/08/2019   MCV 84 03/08/2019   PLT 339 03/08/2019   Lab Results  Component Value Date   IRON 54 03/08/2019   TIBC 343 03/08/2019   FERRITIN 13 (L) 03/08/2019   Attestation Statements:   Reviewed by clinician on day of visit: allergies, medications, problem list, medical history, surgical history, family history, social history, and previous encounter notes.   Shannon [name], am acting as Shannon [name] for Shannon Schw[employer], Shannon [name]-C.  I have reviewed the above documentation for accuracy and completeness, and I agree with the above. -  Shannon Fick, Shannon [name]

## 2019-07-21 DIAGNOSIS — F3341 Major depressive disorder, recurrent, in partial remission: Secondary | ICD-10-CM | POA: Diagnosis not present

## 2019-07-26 ENCOUNTER — Ambulatory Visit: Payer: BC Managed Care – PPO | Attending: Internal Medicine

## 2019-07-26 DIAGNOSIS — Z23 Encounter for immunization: Secondary | ICD-10-CM

## 2019-07-26 NOTE — Progress Notes (Signed)
   Covid-19 Vaccination Clinic  Name:  Shannon [name]    MRN: [id number] DOB: April 05, 1990  07/26/2019  [name] was observed post Covid-19 immunization for 15 minutes without incident. She was provided with Vaccine Information Sheet and instruction to access the V-Safe system.   [name] was instructed to call 911 with any severe reactions post vaccine: [name] Difficulty breathing  . Swelling of face and throat  . A fast heartbeat  . A bad rash all over body  . Dizziness and weakness   Immunizations Administered    Name Date Dose VIS Date Route   Pfizer COVID-19 Vaccine 07/26/2019  1:32 PM 0.3 mL 05/19/2018 Intramuscular   Manufacturer: [place]   Lot: [id number]   [place]: [id number]

## 2019-07-28 DIAGNOSIS — F3341 Major depressive disorder, recurrent, in partial remission: Secondary | ICD-10-CM | POA: Diagnosis not present

## 2019-08-04 DIAGNOSIS — F3341 Major depressive disorder, recurrent, in partial remission: Secondary | ICD-10-CM | POA: Diagnosis not present

## 2019-08-10 ENCOUNTER — Ambulatory Visit (INDEPENDENT_AMBULATORY_CARE_PROVIDER_SITE_OTHER): Payer: BC Managed Care – PPO | Admitting: Family Medicine

## 2019-08-11 ENCOUNTER — Other Ambulatory Visit: Payer: Self-pay

## 2019-08-11 ENCOUNTER — Encounter: Payer: Self-pay | Admitting: Family

## 2019-08-11 ENCOUNTER — Ambulatory Visit (INDEPENDENT_AMBULATORY_CARE_PROVIDER_SITE_OTHER): Payer: BC Managed Care – PPO | Admitting: Family

## 2019-08-11 VITALS — BP 134/70 | HR 93 | Temp 98.2°F | Ht 69.0 in | Wt 258.8 lb

## 2019-08-11 DIAGNOSIS — R079 Chest pain, unspecified: Secondary | ICD-10-CM | POA: Diagnosis not present

## 2019-08-11 DIAGNOSIS — R5383 Other fatigue: Secondary | ICD-10-CM

## 2019-08-11 DIAGNOSIS — R0602 Shortness of breath: Secondary | ICD-10-CM

## 2019-08-11 DIAGNOSIS — R635 Abnormal weight gain: Secondary | ICD-10-CM

## 2019-08-11 DIAGNOSIS — F3341 Major depressive disorder, recurrent, in partial remission: Secondary | ICD-10-CM | POA: Diagnosis not present

## 2019-08-11 LAB — COMPREHENSIVE METABOLIC PANEL
ALT: 13 U/L (ref 0–35)
AST: 17 U/L (ref 0–37)
Albumin: 4.3 g/dL (ref 3.5–5.2)
Alkaline Phosphatase: 105 U/L (ref 39–117)
BUN: 7 mg/dL (ref 6–23)
CO2: 26 mEq/L (ref 19–32)
Calcium: 9.1 mg/dL (ref 8.4–10.5)
Chloride: 104 mEq/L (ref 96–112)
Creatinine, Ser: 0.98 mg/dL (ref 0.40–1.20)
GFR: 80.9 mL/min (ref >60.00)
Glucose, Bld: 93 mg/dL (ref 70–99)
Potassium: 4.1 mEq/L (ref 3.5–5.1)
Sodium: 137 mEq/L (ref 135–145)
Total Bilirubin: 0.8 mg/dL (ref 0.2–1.2)
Total Protein: 7.6 g/dL (ref 6.0–8.3)

## 2019-08-11 LAB — CBC WITH DIFFERENTIAL/PLATELET
Basophils Absolute: 0.1 10*3/uL (ref 0.0–0.1)
Basophils Relative: 1.6 % (ref 0.0–3.0)
Eosinophils Absolute: 0.2 10*3/uL (ref 0.0–0.7)
Eosinophils Relative: 4.6 % (ref 0.0–5.0)
HCT: 40.8 % (ref 36.0–46.0)
Hemoglobin: 13.4 g/dL (ref 12.0–15.0)
Lymphocytes Relative: 43.6 % (ref 12.0–46.0)
Lymphs Abs: 2 10*3/uL (ref 0.7–4.0)
MCHC: 32.9 g/dL (ref 30.0–36.0)
MCV: 86.1 fl (ref 78.0–100.0)
Monocytes Absolute: 0.4 10*3/uL (ref 0.1–1.0)
Monocytes Relative: 8.9 % (ref 3.0–12.0)
Neutro Abs: 1.9 10*3/uL (ref 1.4–7.7)
Neutrophils Relative %: 41.3 % — ABNORMAL LOW (ref 43.0–77.0)
Platelets: 337 10*3/uL (ref 150.0–400.0)
RBC: 4.74 Mil/uL (ref 3.87–5.11)
RDW: 13.2 % (ref 11.5–15.5)
WBC: 4.6 10*3/uL (ref 4.0–10.5)

## 2019-08-11 LAB — TSH: TSH: 1.04 u[IU]/mL (ref 0.35–4.50)

## 2019-08-11 LAB — HEMOGLOBIN A1C: Hgb A1c MFr Bld: 5.4 % (ref 4.6–6.5)

## 2019-08-11 NOTE — Progress Notes (Signed)
Shannon [name] is a 29 y.o. female with the following history as recorded in EpicCare:  Patient Active Problem List   Diagnosis Date Noted  . Prediabetes 06/14/2019  . Other hyperlipidemia 04/22/2018  . Insulin resistance 04/02/2018  . Shortness of breath on exertion 12/29/2017  . Essential hypertension 12/29/2017  . Vitamin D deficiency 12/29/2017  . Class 2 severe obesity with serious comorbidity and body mass index (BMI) of 36.0 to 36.9 in adult ([place]) 08/11/2017  . Routine general medical examination at a health care facility 08/10/2017  . Hypertension 12/30/2016  . Slurred speech 12/30/2016  . Iron deficiency anemia 09/24/2016  . Fatigue 08/06/2016  . Pulmonary embolism with acute cor pulmonale ([place]) 07/04/2016  . Hypokalemia 07/04/2016  . Prolonged QT interval 07/04/2016  . Depression with anxiety 07/04/2016  . Adjustment disorder with emotional disturbance 03/31/2016  . Major depressive disorder, recurrent severe without psychotic features ([place]) 01/10/2016  . Major depressive disorder, recurrent episode, mild ([place]) 01/01/2016  . Suicide ideation 02/16/2013    Current Outpatient Medications  Medication Sig Dispense Refill  . buPROPion (WELLBUTRIN SR) 150 MG 12 hr tablet Take 1 tablet (150 mg total) by mouth 2 (two) times daily. 60 tablet 0  . Ferrous Sulfate (IRON) 325 (65 Fe) MG TABS Take 1 tablet (325 mg total) by mouth daily. 30 tablet 0  . losartan (COZAAR) 25 MG tablet Take 1 tablet (25 mg total) by mouth daily. 90 tablet 3  . metFORMIN (GLUCOPHAGE) 500 MG tablet Take 1 tablet (500 mg total) by mouth in the morning. 30 tablet 0  . norethindrone (ORTHO MICRONOR) 0.35 MG tablet Take 1 tablet (0.35 mg total) by mouth daily. 3 Package 4  . Pediatric Multiple Vit-C-FA (FLINSTONES GUMMIES OMEGA-3 DHA PO) Take by mouth.    . Vitamin D, Ergocalciferol, 50 MCG (2000 UT) CAPS Take 2,000 Units by mouth daily. 30 capsule 0   No current facility-administered medications for this  visit.    Allergies: Bupropion  Past Medical History:  Diagnosis Date  . Anemia   . Anxiety   . Constipation   . Depression   . Dyspnea   . HTN (hypertension)   . Knee pain   . Leg edema   . Medical history non-contributory   . Obesity   . Pain in both feet   . Prolonged QT interval   . Pulmonary thromboembolism ([place])   . Suicide ideation     Past Surgical History:  Procedure Laterality Date  . NO PAST SURGERIES    . TEETH REMOVAL  07/2017    Family History  Problem Relation Age of Onset  . Healthy Mother   . Hypertension Mother   . Obesity Mother   . Healthy Father   . Pulmonary embolism Neg Hx   . Deep vein thrombosis Neg Hx     Social History   Tobacco Use  . Smoking status: Never Smoker  . Smokeless tobacco: Never Used  Substance Use Topics  . Alcohol use: No    Subjective:  History of PE in 2018- thinks it was related to birth control; took 3-4 months of anti-coagulation and then was able to stop;  In the past few months, has been having increased fatigue- working 60 hours/ week;  has noticed some episodes of palpitations over the past few months; however  on Sunday had a hard time to stop coughing; on Monday, felt like it was difficult to take deep breath in/ shortness of breath; is worried that she may  have developed another blood clot; did undergo coag testing in 2018 with no abnormalities noted;    Objective:  Vitals:   08/11/19 0843  BP: 134/70  Pulse: 93  Temp: 98.2 F (36.8 C)  TempSrc: Oral  SpO2: 98%  Weight: 258 lb 12.8 oz (117.4 kg)  Height: '5\' 9"'$  (1.753 m)    General: Well developed, well nourished, in no acute distress  Skin : Warm and dry.  Head: Normocephalic and atraumatic  Eyes: Sclera and conjunctiva clear; pupils round and reactive to light; extraocular movements intact  Ears: External normal; canals clear; tympanic membranes normal  Oropharynx: Pink, supple. No suspicious lesions  Neck: Supple without thyromegaly, adenopathy   Lungs: Respirations unlabored; clear to auscultation bilaterally without wheeze, rales, rhonchi  CVS exam: normal rate and regular rhythm.  Musculoskeletal: No deformities; no active joint inflammation  Extremities: No edema, cyanosis, clubbing  Vessels: Symmetric bilaterally  Neurologic: Alert and oriented; speech intact; face symmetrical; moves all extremities well; CNII-XII intact without focal deficit   Assessment:  1. Other fatigue   2. Weight gain   3. Shortness of breath   4. Chest pain, unspecified type     Plan:  Due to her history of PE, agree needs to get updated imaging; she wants to get her CT done tomorrow if possible due to prior engagements today; Will also updated labs today; follow-up to be determined; ER precautions discussed with patient.  This visit occurred during the SARS-CoV-2 public health emergency.  Safety protocols were in place, including screening questions prior to the visit, additional usage of staff PPE, and extensive cleaning of exam room while observing appropriate contact time as indicated for disinfecting solutions.     No follow-ups on file.  Orders Placed This Encounter  Procedures  . CT Angio Chest W/Cm &/Or Wo Cm    Wants to get test done tomorrow    Standing Status:   Future    Standing Expiration Date:   11/10/2020    Order Specific Question:   If indicated for the ordered procedure, I authorize the administration of contrast media per Radiology protocol    Answer:   Yes    Order Specific Question:   Is patient pregnant?    Answer:   No    Order Specific Question:   Preferred imaging location?    Answer:   [address]    Order Specific Question:   Radiology Contrast Protocol - do NOT remove file path    Answer:   [email or web address]  . CBC with Differential/Platelet  . Comp Met (CMET)  . TSH  . HgB A1c  . D-Dimer, Quantitative    Requested Prescriptions    No prescriptions requested or ordered  in this encounter

## 2019-08-12 ENCOUNTER — Ambulatory Visit (INDEPENDENT_AMBULATORY_CARE_PROVIDER_SITE_OTHER)
Admission: RE | Admit: 2019-08-12 | Discharge: 2019-08-12 | Disposition: A | Discharge status: Home / Self Care | Payer: BC Managed Care – PPO | Source: Ambulatory Visit | Attending: Family | Admitting: Family

## 2019-08-12 DIAGNOSIS — R079 Chest pain, unspecified: Secondary | ICD-10-CM

## 2019-08-12 DIAGNOSIS — R0602 Shortness of breath: Secondary | ICD-10-CM | POA: Diagnosis not present

## 2019-08-12 LAB — D-DIMER, QUANTITATIVE: D-Dimer, Quant: 0.2 mcg/mL FEU (ref <0.50)

## 2019-08-12 MED ORDER — IOHEXOL 350 MG/ML SOLN
80.0000 mL | Freq: Once | INTRAVENOUS | Status: AC | PRN
  Administered 2019-08-12: 80 mL via INTRAVENOUS

## 2019-08-13 ENCOUNTER — Encounter: Payer: Self-pay | Admitting: Family

## 2019-08-13 ENCOUNTER — Ambulatory Visit: Payer: BC Managed Care – PPO | Admitting: Family

## 2019-08-13 ENCOUNTER — Other Ambulatory Visit: Payer: Self-pay | Admitting: Family

## 2019-08-13 DIAGNOSIS — R059 Cough, unspecified: Secondary | ICD-10-CM

## 2019-08-18 DIAGNOSIS — F3341 Major depressive disorder, recurrent, in partial remission: Secondary | ICD-10-CM | POA: Diagnosis not present

## 2019-08-25 DIAGNOSIS — F3341 Major depressive disorder, recurrent, in partial remission: Secondary | ICD-10-CM | POA: Diagnosis not present

## 2019-09-01 DIAGNOSIS — F3341 Major depressive disorder, recurrent, in partial remission: Secondary | ICD-10-CM | POA: Diagnosis not present

## 2019-09-08 DIAGNOSIS — F3341 Major depressive disorder, recurrent, in partial remission: Secondary | ICD-10-CM | POA: Diagnosis not present

## 2019-09-20 DIAGNOSIS — F332 Major depressive disorder, recurrent severe without psychotic features: Secondary | ICD-10-CM | POA: Diagnosis not present

## 2019-09-20 DIAGNOSIS — F339 Major depressive disorder, recurrent, unspecified: Secondary | ICD-10-CM | POA: Diagnosis not present

## 2019-09-22 DIAGNOSIS — F3341 Major depressive disorder, recurrent, in partial remission: Secondary | ICD-10-CM | POA: Diagnosis not present

## 2019-09-29 DIAGNOSIS — F3341 Major depressive disorder, recurrent, in partial remission: Secondary | ICD-10-CM | POA: Diagnosis not present

## 2019-10-04 ENCOUNTER — Encounter: Payer: Self-pay | Admitting: Nurse Practitioner

## 2019-10-04 ENCOUNTER — Ambulatory Visit (INDEPENDENT_AMBULATORY_CARE_PROVIDER_SITE_OTHER): Payer: BC Managed Care – PPO | Admitting: Nurse Practitioner

## 2019-10-04 ENCOUNTER — Other Ambulatory Visit: Payer: Self-pay

## 2019-10-04 VITALS — BP 140/80 | Ht 69.0 in | Wt 272.0 lb

## 2019-10-04 DIAGNOSIS — Z3041 Encounter for surveillance of contraceptive pills: Secondary | ICD-10-CM

## 2019-10-04 DIAGNOSIS — Z01419 Encounter for gynecological examination (general) (routine) without abnormal findings: Secondary | ICD-10-CM | POA: Diagnosis not present

## 2019-10-04 MED ORDER — NORETHINDRONE 0.35 MG PO TABS: 1.0000 | ORAL_TABLET | Freq: Every day | ORAL | 3 refills | Status: DC

## 2019-10-04 NOTE — Patient Instructions (Signed)
Health Maintenance, Female Adopting a healthy lifestyle and getting preventive care are important in promoting health and wellness. Ask your health care provider about:  The right schedule for you to have regular tests and exams.  Things you can do on your own to prevent diseases and keep yourself healthy. What should I know about diet, weight, and exercise? Eat a healthy diet   Eat a diet that includes plenty of vegetables, fruits, low-fat dairy products, and lean protein.  Do not eat a lot of foods that are high in solid fats, added sugars, or sodium. Maintain a healthy weight Body mass index (BMI) is used to identify weight problems. It estimates body fat based on height and weight. Your health care provider can help determine your BMI and help you achieve or maintain a healthy weight. Get regular exercise Get regular exercise. This is one of the most important things you can do for your health. Most adults should:  Exercise for at least 150 minutes each week. The exercise should increase your heart rate and make you sweat (moderate-intensity exercise).  Do strengthening exercises at least twice a week. This is in addition to the moderate-intensity exercise.  Spend less time sitting. Even light physical activity can be beneficial. Watch cholesterol and blood lipids Have your blood tested for lipids and cholesterol at 30 years of age, then have this test every 5 years. Have your cholesterol levels checked more often if:  Your lipid or cholesterol levels are high.  You are older than 30 years of age.  You are at high risk for heart disease. What should I know about cancer screening? Depending on your health history and family history, you may need to have cancer screening at various ages. This may include screening for:  Breast cancer.  Cervical cancer.  Colorectal cancer.  Skin cancer.  Lung cancer. What should I know about heart disease, diabetes, and high blood  pressure? Blood pressure and heart disease  High blood pressure causes heart disease and increases the risk of stroke. This is more likely to develop in people who have high blood pressure readings, are of African descent, or are overweight.  Have your blood pressure checked: ? Every 3-5 years if you are 18-39 years of age. ? Every year if you are 40 years old or older. Diabetes Have regular diabetes screenings. This checks your fasting blood sugar level. Have the screening done:  Once every three years after age 40 if you are at a normal weight and have a low risk for diabetes.  More often and at a younger age if you are overweight or have a high risk for diabetes. What should I know about preventing infection? Hepatitis B If you have a higher risk for hepatitis B, you should be screened for this virus. Talk with your health care provider to find out if you are at risk for hepatitis B infection. Hepatitis C Testing is recommended for:  Everyone born from 1945 through 1965.  Anyone with known risk factors for hepatitis C. Sexually transmitted infections (STIs)  Get screened for STIs, including gonorrhea and chlamydia, if: ? You are sexually active and are younger than 30 years of age. ? You are older than 30 years of age and your health care provider tells you that you are at risk for this type of infection. ? Your sexual activity has changed since you were last screened, and you are at increased risk for chlamydia or gonorrhea. Ask your health care provider if   you are at risk.  Ask your health care provider about whether you are at high risk for HIV. Your health care provider may recommend a prescription medicine to help prevent HIV infection. If you choose to take medicine to prevent HIV, you should first get tested for HIV. You should then be tested every 3 months for as long as you are taking the medicine. Pregnancy  If you are about to stop having your period (premenopausal) and  you may become pregnant, seek counseling before you get pregnant.  Take 400 to 800 micrograms (mcg) of folic acid every day if you become pregnant.  Ask for birth control (contraception) if you want to prevent pregnancy. Osteoporosis and menopause Osteoporosis is a disease in which the bones lose minerals and strength with aging. This can result in bone fractures. If you are 65 years old or older, or if you are at risk for osteoporosis and fractures, ask your health care provider if you should:  Be screened for bone loss.  Take a calcium or vitamin D supplement to lower your risk of fractures.  Be given hormone replacement therapy (HRT) to treat symptoms of menopause. Follow these instructions at home: Lifestyle  Do not use any products that contain nicotine or tobacco, such as cigarettes, e-cigarettes, and chewing tobacco. If you need help quitting, ask your health care provider.  Do not use street drugs.  Do not share needles.  Ask your health care provider for help if you need support or information about quitting drugs. Alcohol use  Do not drink alcohol if: ? Your health care provider tells you not to drink. ? You are pregnant, may be pregnant, or are planning to become pregnant.  If you drink alcohol: ? Limit how much you use to 0-1 drink a day. ? Limit intake if you are breastfeeding.  Be aware of how much alcohol is in your drink. In the [place] one drink equals one 12 oz bottle of beer (355 mL), one 5 oz glass of wine (148 mL), or one 1 oz glass of hard liquor (44 mL). General instructions  Schedule regular health, dental, and eye exams.  Stay current with your vaccines.  Tell your health care provider if: ? You often feel depressed. ? You have ever been abused or do not feel safe at home. Summary  Adopting a healthy lifestyle and getting preventive care are important in promoting health and wellness.  Follow your health care provider's instructions about healthy  diet, exercising, and getting tested or screened for diseases.  Follow your health care provider's instructions on monitoring your cholesterol and blood pressure. This information is not intended to replace advice given to you by your health care provider. Make sure you discuss any questions you have with your health care provider. Document Revised: 03/04/2018 Document Reviewed: 03/04/2018 Elsevier Patient Education  2020 Else[employer] Inc.  
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
## 2019-10-06 DIAGNOSIS — F3341 Major depressive disorder, recurrent, in partial remission: Secondary | ICD-10-CM | POA: Diagnosis not present

## 2019-10-13 DIAGNOSIS — F3341 Major depressive disorder, recurrent, in partial remission: Secondary | ICD-10-CM | POA: Diagnosis not present

## 2019-10-20 DIAGNOSIS — F3341 Major depressive disorder, recurrent, in partial remission: Secondary | ICD-10-CM | POA: Diagnosis not present

## 2019-11-03 DIAGNOSIS — F3341 Major depressive disorder, recurrent, in partial remission: Secondary | ICD-10-CM | POA: Diagnosis not present

## 2019-11-10 DIAGNOSIS — F3341 Major depressive disorder, recurrent, in partial remission: Secondary | ICD-10-CM | POA: Diagnosis not present

## 2019-11-17 DIAGNOSIS — F3341 Major depressive disorder, recurrent, in partial remission: Secondary | ICD-10-CM | POA: Diagnosis not present

## 2019-11-24 DIAGNOSIS — F3341 Major depressive disorder, recurrent, in partial remission: Secondary | ICD-10-CM | POA: Diagnosis not present

## 2019-12-01 DIAGNOSIS — F3341 Major depressive disorder, recurrent, in partial remission: Secondary | ICD-10-CM | POA: Diagnosis not present

## 2019-12-08 DIAGNOSIS — F3341 Major depressive disorder, recurrent, in partial remission: Secondary | ICD-10-CM | POA: Diagnosis not present

## 2019-12-09 DIAGNOSIS — F332 Major depressive disorder, recurrent severe without psychotic features: Secondary | ICD-10-CM | POA: Diagnosis not present

## 2019-12-15 DIAGNOSIS — F3341 Major depressive disorder, recurrent, in partial remission: Secondary | ICD-10-CM | POA: Diagnosis not present

## 2019-12-22 DIAGNOSIS — F3341 Major depressive disorder, recurrent, in partial remission: Secondary | ICD-10-CM | POA: Diagnosis not present

## 2019-12-29 DIAGNOSIS — F3341 Major depressive disorder, recurrent, in partial remission: Secondary | ICD-10-CM | POA: Diagnosis not present

## 2020-01-05 DIAGNOSIS — F3341 Major depressive disorder, recurrent, in partial remission: Secondary | ICD-10-CM | POA: Diagnosis not present

## 2020-01-10 DIAGNOSIS — F332 Major depressive disorder, recurrent severe without psychotic features: Secondary | ICD-10-CM | POA: Diagnosis not present

## 2020-01-12 DIAGNOSIS — F3341 Major depressive disorder, recurrent, in partial remission: Secondary | ICD-10-CM | POA: Diagnosis not present

## 2020-01-19 DIAGNOSIS — F3341 Major depressive disorder, recurrent, in partial remission: Secondary | ICD-10-CM | POA: Diagnosis not present

## 2020-01-26 DIAGNOSIS — F3341 Major depressive disorder, recurrent, in partial remission: Secondary | ICD-10-CM | POA: Diagnosis not present

## 2020-02-02 DIAGNOSIS — F3341 Major depressive disorder, recurrent, in partial remission: Secondary | ICD-10-CM | POA: Diagnosis not present

## 2020-02-08 DIAGNOSIS — F332 Major depressive disorder, recurrent severe without psychotic features: Secondary | ICD-10-CM | POA: Diagnosis not present

## 2020-02-09 DIAGNOSIS — F3341 Major depressive disorder, recurrent, in partial remission: Secondary | ICD-10-CM | POA: Diagnosis not present

## 2020-02-14 DIAGNOSIS — F3341 Major depressive disorder, recurrent, in partial remission: Secondary | ICD-10-CM | POA: Diagnosis not present

## 2020-02-23 DIAGNOSIS — F3341 Major depressive disorder, recurrent, in partial remission: Secondary | ICD-10-CM | POA: Diagnosis not present

## 2020-03-01 DIAGNOSIS — F3341 Major depressive disorder, recurrent, in partial remission: Secondary | ICD-10-CM | POA: Diagnosis not present

## 2020-03-07 DIAGNOSIS — F429 Obsessive-compulsive disorder, unspecified: Secondary | ICD-10-CM | POA: Diagnosis not present

## 2020-03-07 DIAGNOSIS — F332 Major depressive disorder, recurrent severe without psychotic features: Secondary | ICD-10-CM | POA: Diagnosis not present

## 2020-03-08 DIAGNOSIS — F3341 Major depressive disorder, recurrent, in partial remission: Secondary | ICD-10-CM | POA: Diagnosis not present

## 2020-03-13 DIAGNOSIS — F3341 Major depressive disorder, recurrent, in partial remission: Secondary | ICD-10-CM | POA: Diagnosis not present

## 2020-03-15 ENCOUNTER — Ambulatory Visit: Payer: BC Managed Care – PPO | Admitting: Family

## 2020-03-15 ENCOUNTER — Other Ambulatory Visit: Payer: Self-pay

## 2020-03-15 VITALS — BP 150/82 | HR 87 | Temp 98.0°F | Ht 69.0 in | Wt 291.0 lb

## 2020-03-15 DIAGNOSIS — I10 Essential (primary) hypertension: Secondary | ICD-10-CM

## 2020-03-15 DIAGNOSIS — R7303 Prediabetes: Secondary | ICD-10-CM | POA: Diagnosis not present

## 2020-03-15 LAB — CBC WITH DIFFERENTIAL/PLATELET
Basophils Absolute: 0 10*3/uL (ref 0.0–0.1)
Basophils Relative: 0.9 % (ref 0.0–3.0)
Eosinophils Absolute: 0.1 10*3/uL (ref 0.0–0.7)
Eosinophils Relative: 2.5 % (ref 0.0–5.0)
HCT: 41.8 % (ref 36.0–46.0)
Hemoglobin: 13.4 g/dL (ref 12.0–15.0)
Lymphocytes Relative: 42.6 % (ref 12.0–46.0)
Lymphs Abs: 2.2 10*3/uL (ref 0.7–4.0)
MCHC: 32.1 g/dL (ref 30.0–36.0)
MCV: 81.8 fl (ref 78.0–100.0)
Monocytes Absolute: 0.5 10*3/uL (ref 0.1–1.0)
Monocytes Relative: 8.7 % (ref 3.0–12.0)
Neutro Abs: 2.4 10*3/uL (ref 1.4–7.7)
Neutrophils Relative %: 45.3 % (ref 43.0–77.0)
Platelets: 382 10*3/uL (ref 150.0–400.0)
RBC: 5.11 Mil/uL (ref 3.87–5.11)
RDW: 14.1 % (ref 11.5–15.5)
WBC: 5.3 10*3/uL (ref 4.0–10.5)

## 2020-03-15 LAB — COMPREHENSIVE METABOLIC PANEL
ALT: 13 U/L (ref 0–35)
AST: 17 U/L (ref 0–37)
Albumin: 4.4 g/dL (ref 3.5–5.2)
Alkaline Phosphatase: 111 U/L (ref 39–117)
BUN: 7 mg/dL (ref 6–23)
CO2: 25 mEq/L (ref 19–32)
Calcium: 9.3 mg/dL (ref 8.4–10.5)
Chloride: 103 mEq/L (ref 96–112)
Creatinine, Ser: 1.06 mg/dL (ref 0.40–1.20)
GFR: 70.69 mL/min (ref >60.00)
Glucose, Bld: 92 mg/dL (ref 70–99)
Potassium: 4 mEq/L (ref 3.5–5.1)
Sodium: 137 mEq/L (ref 135–145)
Total Bilirubin: 0.9 mg/dL (ref 0.2–1.2)
Total Protein: 8.2 g/dL (ref 6.0–8.3)

## 2020-03-15 LAB — HEMOGLOBIN A1C: Hgb A1c MFr Bld: 5.7 % (ref 4.6–6.5)

## 2020-03-15 MED ORDER — LOSARTAN POTASSIUM 25 MG PO TABS: 25.0000 mg | ORAL_TABLET | Freq: Every day | ORAL | 3 refills | Status: DC

## 2020-03-15 NOTE — Progress Notes (Signed)
Shannon [name] is a 29 y.o. female with the following history as recorded in EpicCare:  Patient Active Problem List   Diagnosis Date Noted  . Prediabetes 06/14/2019  . Other hyperlipidemia 04/22/2018  . Insulin resistance 04/02/2018  . Shortness of breath on exertion 12/29/2017  . Essential hypertension 12/29/2017  . Vitamin D deficiency 12/29/2017  . Class 2 severe obesity with serious comorbidity and body mass index (BMI) of 36.0 to 36.9 in adult ([place]) 08/11/2017  . Routine general medical examination at a health care facility 08/10/2017  . Hypertension 12/30/2016  . Slurred speech 12/30/2016  . Iron deficiency anemia 09/24/2016  . Fatigue 08/06/2016  . Pulmonary embolism with acute cor pulmonale ([place]) 07/04/2016  . Hypokalemia 07/04/2016  . Prolonged QT interval 07/04/2016  . Depression with anxiety 07/04/2016  . Adjustment disorder with emotional disturbance 03/31/2016  . Major depressive disorder, recurrent severe without psychotic features ([place]) 01/10/2016  . Major depressive disorder, recurrent episode, mild ([place]) 01/01/2016  . Suicide ideation 02/16/2013    Current Outpatient Medications  Medication Sig Dispense Refill  . buPROPion (WELLBUTRIN SR) 150 MG 12 hr tablet Take 1 tablet (150 mg total) by mouth 2 (two) times daily. 60 tablet 0  . Multiple Vitamin (MULTIVITAMIN WITH MINERALS) TABS tablet Take 1 tablet by mouth daily.    . norethindrone (ORTHO MICRONOR) 0.35 MG tablet Take 1 tablet (0.35 mg total) by mouth daily. 84 tablet 3  . pramipexole (MIRAPEX) 1.5 MG tablet Take 1.5 mg by mouth at bedtime.    . Vitamin D, Ergocalciferol, 50 MCG (2000 UT) CAPS Take 2,000 Units by mouth daily. 30 capsule 0  . Ferrous Sulfate (IRON) 325 (65 Fe) MG TABS Take 1 tablet (325 mg total) by mouth daily. (Patient not taking: Reported on 03/15/2020) 30 tablet 0  . losartan (COZAAR) 25 MG tablet Take 1 tablet (25 mg total) by mouth daily. 90 tablet 3  . metFORMIN (GLUCOPHAGE) 500 MG tablet  Take 1 tablet (500 mg total) by mouth in the morning. (Patient not taking: Reported on 03/15/2020) 30 tablet 0   No current facility-administered medications for this visit.    Allergies: Bupropion  Past Medical History:  Diagnosis Date  . Anemia   . Anxiety   . Constipation   . Depression   . Dyspnea   . HTN (hypertension)   . Knee pain   . Leg edema   . Medical history non-contributory   . Obesity   . Pain in both feet   . Prolonged QT interval   . Pulmonary thromboembolism ([place])   . Suicide ideation     Past Surgical History:  Procedure Laterality Date  . NO PAST SURGERIES    . TEETH REMOVAL  07/2017    Family History  Problem Relation Age of Onset  . Healthy Mother   . Hypertension Mother   . Obesity Mother   . Healthy Father   . Pulmonary embolism Neg Hx   . Deep vein thrombosis Neg Hx     Social History   Tobacco Use  . Smoking status: Never Smoker  . Smokeless tobacco: Never Used  Substance Use Topics  . Alcohol use: No    Subjective:  Follow-up on chronic care needs; not working with weight loss program due to time constraints; would like to consider re-starting Metformin; admits not taking her Losartan regularly; Denies any chest pain, shortness of breath, blurred vision or headache Still working with psychiatrist; still working 2 jobs- hopes to find something in  [employer], corporate;  Objective:  Vitals:   03/15/20 1123  BP: (!) 150/82  Pulse: 87  Temp: 98 F (36.7 C)  TempSrc: Oral  SpO2: 98%  Weight: 291 lb (132 kg)  Height: $Remove'5\' 9"'myOOGEw$  (1.753 m)    General: Well developed, well nourished, in no acute distress  Head: Normocephalic and atraumatic  Lungs: Respirations unlabored; clear to auscultation bilaterally without wheeze, rales, rhonchi  CVS exam: normal rate and regular rhythm.  Neurologic: Alert and oriented; speech intact; face symmetrical; moves all extremities well; CNII-XII intact without focal deficit   Assessment:  1. Pre-diabetes    2. Essential hypertension     Plan:  1. Update Hgba1c; will need to determine dosage needed for re-starting Metformin; encouraged to try and continue to work on healthy food choices that she learned from healthy weight and wellness program;  2. Uncontrolled; not taking medication; stressed need to take daily;  This visit occurred during the SARS-CoV-2 public health emergency.  Safety protocols were in place, including screening questions prior to the visit, additional usage of staff PPE, and extensive cleaning of exam room while observing appropriate contact time as indicated for disinfecting solutions.     No follow-ups on file.  Orders Placed This Encounter  Procedures  . CBC with Differential/Platelet    Standing Status:   Future    Number of Occurrences:   1    Standing Expiration Date:   03/15/2021  . Comp Met (CMET)    Standing Status:   Future    Number of Occurrences:   1    Standing Expiration Date:   03/15/2021  . Hemoglobin A1c    Standing Status:   Future    Number of Occurrences:   1    Standing Expiration Date:   03/15/2021    Requested Prescriptions   Signed Prescriptions Disp Refills  . losartan (COZAAR) 25 MG tablet 90 tablet 3    Sig: Take 1 tablet (25 mg total) by mouth daily.

## 2020-03-20 ENCOUNTER — Encounter: Payer: BC Managed Care – PPO | Admitting: Family

## 2020-03-20 ENCOUNTER — Other Ambulatory Visit: Payer: Self-pay | Admitting: Family

## 2020-03-20 DIAGNOSIS — E8881 Metabolic syndrome: Secondary | ICD-10-CM

## 2020-03-20 MED ORDER — METFORMIN HCL 500 MG PO TABS
500.0000 mg | ORAL_TABLET | Freq: Every morning | ORAL | 0 refills | Status: DC
Start: 2020-03-20 — End: 2020-09-01

## 2020-03-30 DIAGNOSIS — F3341 Major depressive disorder, recurrent, in partial remission: Secondary | ICD-10-CM | POA: Diagnosis not present

## 2020-04-06 DIAGNOSIS — F429 Obsessive-compulsive disorder, unspecified: Secondary | ICD-10-CM | POA: Diagnosis not present

## 2020-04-06 DIAGNOSIS — F3341 Major depressive disorder, recurrent, in partial remission: Secondary | ICD-10-CM | POA: Diagnosis not present

## 2020-04-06 DIAGNOSIS — F332 Major depressive disorder, recurrent severe without psychotic features: Secondary | ICD-10-CM | POA: Diagnosis not present

## 2020-04-13 DIAGNOSIS — F3341 Major depressive disorder, recurrent, in partial remission: Secondary | ICD-10-CM | POA: Diagnosis not present

## 2020-04-20 DIAGNOSIS — F3341 Major depressive disorder, recurrent, in partial remission: Secondary | ICD-10-CM | POA: Diagnosis not present

## 2020-04-27 DIAGNOSIS — F3341 Major depressive disorder, recurrent, in partial remission: Secondary | ICD-10-CM | POA: Diagnosis not present

## 2020-05-04 DIAGNOSIS — F429 Obsessive-compulsive disorder, unspecified: Secondary | ICD-10-CM | POA: Diagnosis not present

## 2020-05-04 DIAGNOSIS — F3341 Major depressive disorder, recurrent, in partial remission: Secondary | ICD-10-CM | POA: Diagnosis not present

## 2020-05-04 DIAGNOSIS — F332 Major depressive disorder, recurrent severe without psychotic features: Secondary | ICD-10-CM | POA: Diagnosis not present

## 2020-05-11 DIAGNOSIS — F3341 Major depressive disorder, recurrent, in partial remission: Secondary | ICD-10-CM | POA: Diagnosis not present

## 2020-05-18 DIAGNOSIS — F3341 Major depressive disorder, recurrent, in partial remission: Secondary | ICD-10-CM | POA: Diagnosis not present

## 2020-05-25 DIAGNOSIS — F3341 Major depressive disorder, recurrent, in partial remission: Secondary | ICD-10-CM | POA: Diagnosis not present

## 2020-06-01 DIAGNOSIS — F3341 Major depressive disorder, recurrent, in partial remission: Secondary | ICD-10-CM | POA: Diagnosis not present

## 2020-06-01 DIAGNOSIS — F429 Obsessive-compulsive disorder, unspecified: Secondary | ICD-10-CM | POA: Diagnosis not present

## 2020-06-01 DIAGNOSIS — F332 Major depressive disorder, recurrent severe without psychotic features: Secondary | ICD-10-CM | POA: Diagnosis not present

## 2020-06-08 DIAGNOSIS — F3341 Major depressive disorder, recurrent, in partial remission: Secondary | ICD-10-CM | POA: Diagnosis not present

## 2020-06-16 DIAGNOSIS — F3341 Major depressive disorder, recurrent, in partial remission: Secondary | ICD-10-CM | POA: Diagnosis not present

## 2020-06-24 DIAGNOSIS — F3341 Major depressive disorder, recurrent, in partial remission: Secondary | ICD-10-CM | POA: Diagnosis not present

## 2020-06-29 DIAGNOSIS — F332 Major depressive disorder, recurrent severe without psychotic features: Secondary | ICD-10-CM | POA: Diagnosis not present

## 2020-06-29 DIAGNOSIS — F429 Obsessive-compulsive disorder, unspecified: Secondary | ICD-10-CM | POA: Diagnosis not present

## 2020-07-01 DIAGNOSIS — F3341 Major depressive disorder, recurrent, in partial remission: Secondary | ICD-10-CM | POA: Diagnosis not present

## 2020-07-08 DIAGNOSIS — F3341 Major depressive disorder, recurrent, in partial remission: Secondary | ICD-10-CM | POA: Diagnosis not present

## 2020-07-22 DIAGNOSIS — F3341 Major depressive disorder, recurrent, in partial remission: Secondary | ICD-10-CM | POA: Diagnosis not present

## 2020-07-25 DIAGNOSIS — F429 Obsessive-compulsive disorder, unspecified: Secondary | ICD-10-CM | POA: Diagnosis not present

## 2020-07-25 DIAGNOSIS — F332 Major depressive disorder, recurrent severe without psychotic features: Secondary | ICD-10-CM | POA: Diagnosis not present

## 2020-07-29 DIAGNOSIS — F3341 Major depressive disorder, recurrent, in partial remission: Secondary | ICD-10-CM | POA: Diagnosis not present

## 2020-08-01 ENCOUNTER — Ambulatory Visit: Payer: Federal, State, Local not specified - PPO | Admitting: Family

## 2020-08-01 ENCOUNTER — Encounter: Payer: Self-pay | Admitting: Family

## 2020-08-01 ENCOUNTER — Other Ambulatory Visit: Payer: Self-pay

## 2020-08-01 VITALS — BP 160/90 | HR 88 | Temp 98.8°F | Ht 69.0 in | Wt 319.0 lb

## 2020-08-01 DIAGNOSIS — I10 Essential (primary) hypertension: Secondary | ICD-10-CM

## 2020-08-01 DIAGNOSIS — R4689 Other symptoms and signs involving appearance and behavior: Secondary | ICD-10-CM

## 2020-08-01 DIAGNOSIS — F418 Other specified anxiety disorders: Secondary | ICD-10-CM | POA: Diagnosis not present

## 2020-08-01 DIAGNOSIS — R7303 Prediabetes: Secondary | ICD-10-CM

## 2020-08-01 NOTE — Patient Instructions (Signed)
Please let your psychiatrist know that you are not taking your medication regularly; they might be able to adjust the Wellbutrin to a daily option.  Please start taking your blood pressure medication everyday- set an alarm on your phone and/or take with the evening Wellbutrin.

## 2020-08-01 NOTE — Progress Notes (Signed)
Shannon [name] is a 30 y.o. female with the following history as recorded in EpicCare:  Patient Active Problem List   Diagnosis Date Noted  . Prediabetes 06/14/2019  . Other hyperlipidemia 04/22/2018  . Insulin resistance 04/02/2018  . Shortness of breath on exertion 12/29/2017  . Essential hypertension 12/29/2017  . Vitamin D deficiency 12/29/2017  . Class 2 severe obesity with serious comorbidity and body mass index (BMI) of 36.0 to 36.9 in adult ([place]) 08/11/2017  . Routine general medical examination at a health care facility 08/10/2017  . Hypertension 12/30/2016  . Slurred speech 12/30/2016  . Iron deficiency anemia 09/24/2016  . Fatigue 08/06/2016  . Pulmonary embolism with acute cor pulmonale ([place]) 07/04/2016  . Hypokalemia 07/04/2016  . Prolonged QT interval 07/04/2016  . Depression with anxiety 07/04/2016  . Adjustment disorder with emotional disturbance 03/31/2016  . Major depressive disorder, recurrent severe without psychotic features ([place]) 01/10/2016  . Major depressive disorder, recurrent episode, mild ([place]) 01/01/2016  . Suicide ideation 02/16/2013    Current Outpatient Medications  Medication Sig Dispense Refill  . buPROPion (WELLBUTRIN SR) 150 MG 12 hr tablet Take 1 tablet (150 mg total) by mouth 2 (two) times daily. 60 tablet 0  . gabapentin (NEURONTIN) 100 MG capsule Take 100-300 mg by mouth at bedtime as needed.    [name] losartan (COZAAR) 25 MG tablet Take 1 tablet (25 mg total) by mouth daily. 90 tablet 3  . pramipexole (MIRAPEX) 1.5 MG tablet Take 1.5 mg by mouth at bedtime.    . metFORMIN (GLUCOPHAGE) 500 MG tablet Take 1 tablet (500 mg total) by mouth in the morning. (Patient not taking: Reported on 08/01/2020) 90 tablet 0   No current facility-administered medications for this visit.    Allergies: Bupropion  Past Medical History:  Diagnosis Date  . Anemia   . Anxiety   . Constipation   . Depression   . Dyspnea   . HTN (hypertension)   . Knee pain   .  Leg edema   . Medical history non-contributory   . Obesity   . Pain in both feet   . Prolonged QT interval   . Pulmonary thromboembolism ([place])   . Suicide ideation     Past Surgical History:  Procedure Laterality Date  . NO PAST SURGERIES    . TEETH REMOVAL  07/2017    Family History  Problem Relation Age of Onset  . Healthy Mother   . Hypertension Mother   . Obesity Mother   . Healthy Father   . Pulmonary embolism Neg Hx   . Deep vein thrombosis Neg Hx     Social History   Tobacco Use  . Smoking status: Never Smoker  . Smokeless tobacco: Never Used  Substance Use Topics  . Alcohol use: No    Subjective:   Presents for 5 month follow up on hypertension/ pre-diabetes; was due for OV in March 2022; still having to work 50-60 hours per week and this makes it difficult to keep appointments; admits she is not taking her blood pressure medication regularly; has not taken her Metformin since February 2022; Denies any chest pain, shortness of breath, blurred vision or headache.  Notes she is taking her Wellbutrin only once per day- is seeing her psychiatrist and counselor regularly;     Objective:  Vitals:   08/01/20 0951  BP: (!) 160/90  Pulse: 88  Temp: 98.8 F (37.1 C)  TempSrc: Oral  SpO2: 99%  Weight: (!) 319 lb (144.7 kg)  Height: 5\' 9"  (1.753 m)    General: Well developed, well nourished, in no acute distress  Skin : Warm and dry.  Head: Normocephalic and atraumatic  Lungs: Respirations unlabored; clear to auscultation bilaterally without wheeze, rales, rhonchi  CVS exam: normal rate and regular rhythm.  Neurologic: Alert and oriented; speech intact; face symmetrical; moves all extremities well; CNII-XII intact without focal deficit   Assessment:  1. Essential hypertension   2. Prediabetes   3. Depression with anxiety   4. Non-compliant behavior     Plan:  1. Uncontrolled- patient is not taking her medication; she notes she just cannot remember to take  her medication; asked her to try setting an alarm on her phone and stressed absolute necessity to take her medication daily; she is also reassured that she can take her blood pressure medication with her Wellbutrin.  2. Not taking Metformin- will plan to do labs at next OV; may be a candidate for a medication like Saxenda or Wegovy that is a weekly injection; 3. She is asked to talk to her psychiatrist about forgetting medication- explained that they may be able to change her Wellbutrin to once daily option;   Time spent 30 minutes discussing care/ options to help her manage taking her medication;  This visit occurred during the SARS-CoV-2 public health emergency.  Safety protocols were in place, including screening questions prior to the visit, additional usage of staff PPE, and extensive cleaning of exam room while observing appropriate contact time as indicated for disinfecting solutions.     Return in about 1 month (around 09/01/2020).  No orders of the defined types were placed in this encounter.   Requested Prescriptions    No prescriptions requested or ordered in this encounter

## 2020-08-05 DIAGNOSIS — F3341 Major depressive disorder, recurrent, in partial remission: Secondary | ICD-10-CM | POA: Diagnosis not present

## 2020-08-12 DIAGNOSIS — F3341 Major depressive disorder, recurrent, in partial remission: Secondary | ICD-10-CM | POA: Diagnosis not present

## 2020-08-19 DIAGNOSIS — F3341 Major depressive disorder, recurrent, in partial remission: Secondary | ICD-10-CM | POA: Diagnosis not present

## 2020-08-22 DIAGNOSIS — F429 Obsessive-compulsive disorder, unspecified: Secondary | ICD-10-CM | POA: Diagnosis not present

## 2020-08-22 DIAGNOSIS — F332 Major depressive disorder, recurrent severe without psychotic features: Secondary | ICD-10-CM | POA: Diagnosis not present

## 2020-08-26 DIAGNOSIS — F3341 Major depressive disorder, recurrent, in partial remission: Secondary | ICD-10-CM | POA: Diagnosis not present

## 2020-09-01 ENCOUNTER — Other Ambulatory Visit: Payer: Self-pay | Admitting: Family

## 2020-09-01 ENCOUNTER — Ambulatory Visit: Payer: Federal, State, Local not specified - PPO | Admitting: Family

## 2020-09-01 ENCOUNTER — Other Ambulatory Visit: Payer: Self-pay

## 2020-09-01 ENCOUNTER — Encounter: Payer: Self-pay | Admitting: Family

## 2020-09-01 VITALS — BP 134/82 | HR 102 | Temp 97.2°F | Resp 16 | Wt 322.2 lb

## 2020-09-01 DIAGNOSIS — R7309 Other abnormal glucose: Secondary | ICD-10-CM | POA: Diagnosis not present

## 2020-09-01 DIAGNOSIS — R5383 Other fatigue: Secondary | ICD-10-CM

## 2020-09-01 DIAGNOSIS — I10 Essential (primary) hypertension: Secondary | ICD-10-CM

## 2020-09-01 DIAGNOSIS — F3341 Major depressive disorder, recurrent, in partial remission: Secondary | ICD-10-CM | POA: Diagnosis not present

## 2020-09-01 DIAGNOSIS — R600 Localized edema: Secondary | ICD-10-CM

## 2020-09-01 DIAGNOSIS — E8881 Metabolic syndrome: Secondary | ICD-10-CM

## 2020-09-01 LAB — CBC WITH DIFFERENTIAL/PLATELET
Basophils Absolute: 0.1 10*3/uL (ref 0.0–0.1)
Basophils Relative: 0.9 % (ref 0.0–3.0)
Eosinophils Absolute: 0.2 10*3/uL (ref 0.0–0.7)
Eosinophils Relative: 3.5 % (ref 0.0–5.0)
HCT: 38.6 % (ref 36.0–46.0)
Hemoglobin: 12.8 g/dL (ref 12.0–15.0)
Lymphocytes Relative: 38.4 % (ref 12.0–46.0)
Lymphs Abs: 2.2 10*3/uL (ref 0.7–4.0)
MCHC: 33.2 g/dL (ref 30.0–36.0)
MCV: 79.8 fl (ref 78.0–100.0)
Monocytes Absolute: 0.5 10*3/uL (ref 0.1–1.0)
Monocytes Relative: 8.7 % (ref 3.0–12.0)
Neutro Abs: 2.8 10*3/uL (ref 1.4–7.7)
Neutrophils Relative %: 48.5 % (ref 43.0–77.0)
Platelets: 352 10*3/uL (ref 150.0–400.0)
RBC: 4.84 Mil/uL (ref 3.87–5.11)
RDW: 14.4 % (ref 11.5–15.5)
WBC: 5.8 10*3/uL (ref 4.0–10.5)

## 2020-09-01 LAB — COMPREHENSIVE METABOLIC PANEL
ALT: 13 U/L (ref 0–35)
AST: 16 U/L (ref 0–37)
Albumin: 4.2 g/dL (ref 3.5–5.2)
Alkaline Phosphatase: 111 U/L (ref 39–117)
BUN: 6 mg/dL (ref 6–23)
CO2: 26 mEq/L (ref 19–32)
Calcium: 8.9 mg/dL (ref 8.4–10.5)
Chloride: 105 mEq/L (ref 96–112)
Creatinine, Ser: 0.86 mg/dL (ref 0.40–1.20)
GFR: 90.56 mL/min (ref >60.00)
Glucose, Bld: 102 mg/dL — ABNORMAL HIGH (ref 70–99)
Potassium: 4.3 mEq/L (ref 3.5–5.1)
Sodium: 138 mEq/L (ref 135–145)
Total Bilirubin: 0.8 mg/dL (ref 0.2–1.2)
Total Protein: 7.4 g/dL (ref 6.0–8.3)

## 2020-09-01 LAB — HEMOGLOBIN A1C: Hgb A1c MFr Bld: 6.2 % (ref 4.6–6.5)

## 2020-09-01 LAB — TSH: TSH: 0.98 u[IU]/mL (ref 0.35–4.50)

## 2020-09-01 MED ORDER — METFORMIN HCL 500 MG PO TABS: 500.0000 mg | ORAL_TABLET | Freq: Every morning | ORAL | 1 refills | Status: DC

## 2020-09-01 MED ORDER — LOSARTAN POTASSIUM 25 MG PO TABS
25.0000 mg | ORAL_TABLET | Freq: Every day | ORAL | 3 refills | Status: DC
Start: 2020-09-01 — End: 2020-11-22

## 2020-09-01 MED ORDER — HYDROCHLOROTHIAZIDE 12.5 MG PO TABS: ORAL_TABLET | ORAL | 0 refills | Status: DC

## 2020-09-01 NOTE — Progress Notes (Signed)
Shannon [name] is a 30 y.o. female with the following history as recorded in EpicCare:  Patient Active Problem List   Diagnosis Date Noted   Prediabetes 06/14/2019   Other hyperlipidemia 04/22/2018   Insulin resistance 04/02/2018   Shortness of breath on exertion 12/29/2017   Essential hypertension 12/29/2017   Vitamin D deficiency 12/29/2017   Class 2 severe obesity with serious comorbidity and body mass index (BMI) of 36.0 to 36.9 in adult [facility]) 08/11/2017   Routine general medical examination at a health care facility 08/10/2017   Hypertension 12/30/2016   Slurred speech 12/30/2016   Iron deficiency anemia 09/24/2016   Fatigue 08/06/2016   Pulmonary embolism with acute cor pulmonale ([place]) 07/04/2016   Hypokalemia 07/04/2016   Prolonged QT interval 07/04/2016   Depression with anxiety 07/04/2016   Adjustment disorder with emotional disturbance 03/31/2016   Major depressive disorder, recurrent severe without psychotic features ([place]) 01/10/2016   Major depressive disorder, recurrent episode, mild ([place]) 01/01/2016   Suicide ideation 02/16/2013    Current Outpatient Medications  Medication Sig Dispense Refill   buPROPion (WELLBUTRIN SR) 150 MG 12 hr tablet Take 1 tablet (150 mg total) by mouth 2 (two) times daily. 60 tablet 0   gabapentin (NEURONTIN) 100 MG capsule Take 100-300 mg by mouth at bedtime as needed.     pramipexole (MIRAPEX) 1.5 MG tablet Take 1.5 mg by mouth at bedtime.     hydrochlorothiazide (HYDRODIURIL) 12.5 MG tablet TAKE 1 TO 2 TABLETS BY MOUTH DAILY AS NEEDED FOR SWELLING 180 tablet 0   losartan (COZAAR) 25 MG tablet Take 1 tablet (25 mg total) by mouth daily. 90 tablet 3   metFORMIN (GLUCOPHAGE) 500 MG tablet Take 1 tablet (500 mg total) by mouth in the morning. (Patient not taking: Reported on 09/01/2020) 90 tablet 0   No current facility-administered medications for this visit.    Allergies: Bupropion  Past Medical History:  Diagnosis Date   Anemia     Anxiety    Constipation    Depression    Dyspnea    HTN (hypertension)    Knee pain    Leg edema    Medical history non-contributory    Obesity    Pain in both feet    Prolonged QT interval    Pulmonary thromboembolism ([place])    Suicide ideation     Past Surgical History:  Procedure Laterality Date   NO PAST SURGERIES     TEETH REMOVAL  07/2017    Family History  Problem Relation Age of Onset   Healthy Mother    Hypertension Mother    Obesity Mother    Healthy Father    Pulmonary embolism Neg Hx    Deep vein thrombosis Neg Hx     Social History   Tobacco Use   Smoking status: Never   Smokeless tobacco: Never  Substance Use Topics   Alcohol use: No    Subjective:   Follow up on hypertension; has started taking her medication regularly as prescribed; Denies any chest pain, shortness of breath, blurred vision or headache; does occasionally feel that she has some swelling in her feet- not daily/ more noticeable in summer;   Objective:  Vitals:   09/01/20 0936  BP: 134/82  Pulse: (!) 102  Resp: 16  Temp: (!) 97.2 F (36.2 C)  SpO2: 99%  Weight: (!) 322 lb 3.2 oz (146.1 kg)    General: Well developed, well nourished, in no acute distress  Skin : Warm and dry.  Head: Normocephalic and atraumatic  Lungs: Respirations unlabored; clear to auscultation bilaterally without wheeze, rales, rhonchi  CVS exam: normal rate and regular rhythm.  Extremities: No edema, cyanosis, clubbing  Vessels: Symmetric bilaterally  Neurologic: Alert and oriented; speech intact; face symmetrical; moves all extremities well; CNII-XII intact without focal deficit   Assessment:  1. Essential hypertension   2. Pedal edema   3. Elevated glucose   4. Other fatigue     Plan:  Stable; refill updated;  No edema noted today but can use HCTZ 12.5 mg 1-2 tablets daily as needed for swelling; Check labs today; to consider re-starting Metformin.   This visit occurred during the SARS-CoV-2  public health emergency.  Safety protocols were in place, including screening questions prior to the visit, additional usage of staff PPE, and extensive cleaning of exam room while observing appropriate contact time as indicated for disinfecting solutions.    No follow-ups on file.  Orders Placed This Encounter  Procedures   CBC with Differential/Platelet   Comp Met (CMET)   TSH   Hemoglobin A1c    Requested Prescriptions   Signed Prescriptions Disp Refills   losartan (COZAAR) 25 MG tablet 90 tablet 3    Sig: Take 1 tablet (25 mg total) by mouth daily.

## 2020-09-09 DIAGNOSIS — F3341 Major depressive disorder, recurrent, in partial remission: Secondary | ICD-10-CM | POA: Diagnosis not present

## 2020-09-30 DIAGNOSIS — F3341 Major depressive disorder, recurrent, in partial remission: Secondary | ICD-10-CM | POA: Diagnosis not present

## 2020-10-04 ENCOUNTER — Ambulatory Visit: Payer: BC Managed Care – PPO | Admitting: Nurse Practitioner

## 2020-10-06 DIAGNOSIS — F3341 Major depressive disorder, recurrent, in partial remission: Secondary | ICD-10-CM | POA: Diagnosis not present

## 2020-10-14 DIAGNOSIS — F3341 Major depressive disorder, recurrent, in partial remission: Secondary | ICD-10-CM | POA: Diagnosis not present

## 2020-10-17 DIAGNOSIS — F3341 Major depressive disorder, recurrent, in partial remission: Secondary | ICD-10-CM | POA: Diagnosis not present

## 2020-10-21 DIAGNOSIS — F3341 Major depressive disorder, recurrent, in partial remission: Secondary | ICD-10-CM | POA: Diagnosis not present

## 2020-10-28 DIAGNOSIS — F3341 Major depressive disorder, recurrent, in partial remission: Secondary | ICD-10-CM | POA: Diagnosis not present

## 2020-10-31 DIAGNOSIS — F3341 Major depressive disorder, recurrent, in partial remission: Secondary | ICD-10-CM | POA: Diagnosis not present

## 2020-11-11 DIAGNOSIS — F3341 Major depressive disorder, recurrent, in partial remission: Secondary | ICD-10-CM | POA: Diagnosis not present

## 2020-11-17 DIAGNOSIS — F3341 Major depressive disorder, recurrent, in partial remission: Secondary | ICD-10-CM | POA: Diagnosis not present

## 2020-11-22 ENCOUNTER — Other Ambulatory Visit: Payer: Self-pay | Admitting: Family

## 2020-11-22 DIAGNOSIS — I10 Essential (primary) hypertension: Secondary | ICD-10-CM

## 2020-11-24 DIAGNOSIS — F3341 Major depressive disorder, recurrent, in partial remission: Secondary | ICD-10-CM | POA: Diagnosis not present

## 2020-12-02 DIAGNOSIS — F3341 Major depressive disorder, recurrent, in partial remission: Secondary | ICD-10-CM | POA: Diagnosis not present

## 2020-12-06 DIAGNOSIS — F3341 Major depressive disorder, recurrent, in partial remission: Secondary | ICD-10-CM | POA: Diagnosis not present

## 2020-12-16 DIAGNOSIS — F3341 Major depressive disorder, recurrent, in partial remission: Secondary | ICD-10-CM | POA: Diagnosis not present

## 2020-12-29 DIAGNOSIS — F3341 Major depressive disorder, recurrent, in partial remission: Secondary | ICD-10-CM | POA: Diagnosis not present

## 2021-01-05 DIAGNOSIS — F3341 Major depressive disorder, recurrent, in partial remission: Secondary | ICD-10-CM | POA: Diagnosis not present

## 2021-01-09 ENCOUNTER — Other Ambulatory Visit: Payer: Self-pay

## 2021-01-09 ENCOUNTER — Ambulatory Visit (INDEPENDENT_AMBULATORY_CARE_PROVIDER_SITE_OTHER): Payer: BC Managed Care – PPO | Admitting: Family

## 2021-01-09 ENCOUNTER — Encounter: Payer: Self-pay | Admitting: Family

## 2021-01-09 VITALS — BP 130/76 | HR 86 | Temp 97.9°F | Ht 69.0 in | Wt 319.6 lb

## 2021-01-09 DIAGNOSIS — R7303 Prediabetes: Secondary | ICD-10-CM

## 2021-01-09 DIAGNOSIS — K625 Hemorrhage of anus and rectum: Secondary | ICD-10-CM

## 2021-01-09 LAB — COMPREHENSIVE METABOLIC PANEL
ALT: 10 U/L (ref 0–35)
AST: 14 U/L (ref 0–37)
Albumin: 4.1 g/dL (ref 3.5–5.2)
Alkaline Phosphatase: 120 U/L — ABNORMAL HIGH (ref 39–117)
BUN: 6 mg/dL (ref 6–23)
CO2: 26 mEq/L (ref 19–32)
Calcium: 9.1 mg/dL (ref 8.4–10.5)
Chloride: 105 mEq/L (ref 96–112)
Creatinine, Ser: 0.96 mg/dL (ref 0.40–1.20)
GFR: 79.16 mL/min (ref >60.00)
Glucose, Bld: 98 mg/dL (ref 70–99)
Potassium: 4.2 mEq/L (ref 3.5–5.1)
Sodium: 138 mEq/L (ref 135–145)
Total Bilirubin: 0.8 mg/dL (ref 0.2–1.2)
Total Protein: 7.2 g/dL (ref 6.0–8.3)

## 2021-01-09 LAB — CBC WITH DIFFERENTIAL/PLATELET
Basophils Absolute: 0 10*3/uL (ref 0.0–0.1)
Basophils Relative: 0.8 % (ref 0.0–3.0)
Eosinophils Absolute: 0.2 10*3/uL (ref 0.0–0.7)
Eosinophils Relative: 3.6 % (ref 0.0–5.0)
HCT: 39.3 % (ref 36.0–46.0)
Hemoglobin: 12.4 g/dL (ref 12.0–15.0)
Lymphocytes Relative: 41.8 % (ref 12.0–46.0)
Lymphs Abs: 2 10*3/uL (ref 0.7–4.0)
MCHC: 31.5 g/dL (ref 30.0–36.0)
MCV: 79.1 fl (ref 78.0–100.0)
Monocytes Absolute: 0.3 10*3/uL (ref 0.1–1.0)
Monocytes Relative: 7 % (ref 3.0–12.0)
Neutro Abs: 2.3 10*3/uL (ref 1.4–7.7)
Neutrophils Relative %: 46.8 % (ref 43.0–77.0)
Platelets: 351 10*3/uL (ref 150.0–400.0)
RBC: 4.97 Mil/uL (ref 3.87–5.11)
RDW: 15.1 % (ref 11.5–15.5)
WBC: 4.8 10*3/uL (ref 4.0–10.5)

## 2021-01-09 LAB — HEMOGLOBIN A1C: Hgb A1c MFr Bld: 6.3 % (ref 4.6–6.5)

## 2021-01-09 MED ORDER — HYDROCORT-PRAMOXINE (PERIANAL) 1-1 % EX FOAM: 1.0000 | Freq: Three times a day (TID) | CUTANEOUS | 0 refills | Status: DC

## 2021-01-09 NOTE — Progress Notes (Signed)
Shannon [name] is a 30 y.o. female with the following history as recorded in EpicCare:  Patient Active Problem List   Diagnosis Date Noted   Prediabetes 06/14/2019   Other hyperlipidemia 04/22/2018   Insulin resistance 04/02/2018   Shortness of breath on exertion 12/29/2017   Essential hypertension 12/29/2017   Vitamin D deficiency 12/29/2017   Class 2 severe obesity with serious comorbidity and body mass index (BMI) of 36.0 to 36.9 in adult [facility]) 08/11/2017   Routine general medical examination at a health care facility 08/10/2017   Hypertension 12/30/2016   Slurred speech 12/30/2016   Iron deficiency anemia 09/24/2016   Fatigue 08/06/2016   Pulmonary embolism with acute cor pulmonale ([place]) 07/04/2016   Hypokalemia 07/04/2016   Prolonged QT interval 07/04/2016   Depression with anxiety 07/04/2016   Adjustment disorder with emotional disturbance 03/31/2016   Major depressive disorder, recurrent severe without psychotic features ([place]) 01/10/2016   Major depressive disorder, recurrent episode, mild ([place]) 01/01/2016   Suicide ideation 02/16/2013    Current Outpatient Medications  Medication Sig Dispense Refill   buPROPion (WELLBUTRIN SR) 150 MG 12 hr tablet Take 1 tablet (150 mg total) by mouth 2 (two) times daily. 60 tablet 0   gabapentin (NEURONTIN) 100 MG capsule Take 100-300 mg by mouth at bedtime as needed.     hydrochlorothiazide (HYDRODIURIL) 12.5 MG tablet TAKE 1 TO 2 TABLETS BY MOUTH DAILY AS NEEDED FOR SWELLING 180 tablet 0   hydrocortisone-pramoxine (PROCTOFOAM-HC) rectal foam Place 1 applicator rectally 3 (three) times daily. 10 g 0   losartan (COZAAR) 25 MG tablet TAKE 1 TABLET(25 MG) BY MOUTH DAILY 90 tablet 1   metFORMIN (GLUCOPHAGE) 500 MG tablet Take 1 tablet (500 mg total) by mouth in the morning. 90 tablet 1   pramipexole (MIRAPEX) 1.5 MG tablet Take 1.5 mg by mouth at bedtime.     No current facility-administered medications for this visit.    Allergies:  Bupropion  Past Medical History:  Diagnosis Date   Anemia    Anxiety    Constipation    Depression    Dyspnea    HTN (hypertension)    Knee pain    Leg edema    Medical history non-contributory    Obesity    Pain in both feet    Prolonged QT interval    Pulmonary thromboembolism ([place])    Suicide ideation     Past Surgical History:  Procedure Laterality Date   NO PAST SURGERIES     TEETH REMOVAL  07/2017    Family History  Problem Relation Age of Onset   Healthy Mother    Hypertension Mother    Obesity Mother    Healthy Father    Pulmonary embolism Neg Hx    Deep vein thrombosis Neg Hx     Social History   Tobacco Use   Smoking status: Never   Smokeless tobacco: Never  Substance Use Topics   Alcohol use: No    Subjective:   Follow up to re-check labs for pre-diabetes and recent re-start of Metformin;   Also concerned about intermittent bright red blooding; symptoms on and off x 4 months; notes that she will see bleeding for 3-4 days and then stop for a few days and then re-start; denies any pain with bowel movements or tearing sensation; limited relief with OTC treatments; has been trying to exercise but feels like the exercise is aggravating her rectum;     Objective:  Vitals:   01/09/21 1018  BP: 130/76  Pulse: 86  Temp: 97.9 F (36.6 C)  TempSrc: Oral  SpO2: 99%  Weight: (!) 319 lb 9.6 oz (145 kg)  Height: 5' 9" (1.753 m)    General: Well developed, well nourished, in no acute distress  Skin : Warm and dry.  Head: Normocephalic and atraumatic  Lungs: Respirations unlabored;  Neurologic: Alert and oriented; speech intact; face symmetrical; moves all extremities well; CNII-XII intact without focal deficit  Rectal exam- limited due to body habitus; no obvious external abnormalities noted;   Assessment:  1. Pre-diabetes   2. Rectal bleeding     Plan:  Update labs today; continue Metformin as prescribed; Trial of Proctofoam HC; refer to GI for  further evaluation;   This visit occurred during the SARS-CoV-2 public health emergency.  Safety protocols were in place, including screening questions prior to the visit, additional usage of staff PPE, and extensive cleaning of exam room while observing appropriate contact time as indicated for disinfecting solutions.    No follow-ups on file.  Orders Placed This Encounter  Procedures   CBC with Differential/Platelet   Comp Met (CMET)   Hemoglobin A1c   Ambulatory referral to Gastroenterology    Referral Priority:   Routine    Referral Type:   Consultation    Referral Reason:   Specialty Services Required    Number of Visits Requested:   1    Requested Prescriptions   Signed Prescriptions Disp Refills   hydrocortisone-pramoxine (PROCTOFOAM-HC) rectal foam 10 g 0    Sig: Place 1 applicator rectally 3 (three) times daily.

## 2021-01-13 DIAGNOSIS — F3341 Major depressive disorder, recurrent, in partial remission: Secondary | ICD-10-CM | POA: Diagnosis not present

## 2021-01-19 DIAGNOSIS — F3341 Major depressive disorder, recurrent, in partial remission: Secondary | ICD-10-CM | POA: Diagnosis not present

## 2021-01-27 DIAGNOSIS — F3341 Major depressive disorder, recurrent, in partial remission: Secondary | ICD-10-CM | POA: Diagnosis not present

## 2021-02-03 DIAGNOSIS — F3341 Major depressive disorder, recurrent, in partial remission: Secondary | ICD-10-CM | POA: Diagnosis not present

## 2021-02-07 ENCOUNTER — Ambulatory Visit: Payer: BC Managed Care – PPO | Admitting: Nurse Practitioner

## 2021-02-07 DIAGNOSIS — F3341 Major depressive disorder, recurrent, in partial remission: Secondary | ICD-10-CM | POA: Diagnosis not present

## 2021-02-13 DIAGNOSIS — F3341 Major depressive disorder, recurrent, in partial remission: Secondary | ICD-10-CM | POA: Diagnosis not present

## 2021-03-03 DIAGNOSIS — F3341 Major depressive disorder, recurrent, in partial remission: Secondary | ICD-10-CM | POA: Diagnosis not present

## 2021-03-08 DIAGNOSIS — F3341 Major depressive disorder, recurrent, in partial remission: Secondary | ICD-10-CM | POA: Diagnosis not present

## 2021-03-13 DIAGNOSIS — F3341 Major depressive disorder, recurrent, in partial remission: Secondary | ICD-10-CM | POA: Diagnosis not present

## 2021-03-16 DIAGNOSIS — F3341 Major depressive disorder, recurrent, in partial remission: Secondary | ICD-10-CM | POA: Diagnosis not present

## 2021-03-23 DIAGNOSIS — F3341 Major depressive disorder, recurrent, in partial remission: Secondary | ICD-10-CM | POA: Diagnosis not present

## 2021-03-28 DIAGNOSIS — F3341 Major depressive disorder, recurrent, in partial remission: Secondary | ICD-10-CM | POA: Diagnosis not present

## 2021-04-04 DIAGNOSIS — F3341 Major depressive disorder, recurrent, in partial remission: Secondary | ICD-10-CM | POA: Diagnosis not present

## 2021-04-11 DIAGNOSIS — F3341 Major depressive disorder, recurrent, in partial remission: Secondary | ICD-10-CM | POA: Diagnosis not present

## 2021-04-18 DIAGNOSIS — F3341 Major depressive disorder, recurrent, in partial remission: Secondary | ICD-10-CM | POA: Diagnosis not present

## 2021-04-25 DIAGNOSIS — F3341 Major depressive disorder, recurrent, in partial remission: Secondary | ICD-10-CM | POA: Diagnosis not present

## 2021-05-02 DIAGNOSIS — F3341 Major depressive disorder, recurrent, in partial remission: Secondary | ICD-10-CM | POA: Diagnosis not present

## 2021-05-09 DIAGNOSIS — F3341 Major depressive disorder, recurrent, in partial remission: Secondary | ICD-10-CM | POA: Diagnosis not present

## 2021-05-16 DIAGNOSIS — F3341 Major depressive disorder, recurrent, in partial remission: Secondary | ICD-10-CM | POA: Diagnosis not present

## 2021-05-21 ENCOUNTER — Other Ambulatory Visit: Payer: Self-pay | Admitting: Family

## 2021-05-21 DIAGNOSIS — I10 Essential (primary) hypertension: Secondary | ICD-10-CM

## 2021-05-23 DIAGNOSIS — F3341 Major depressive disorder, recurrent, in partial remission: Secondary | ICD-10-CM | POA: Diagnosis not present

## 2021-05-30 DIAGNOSIS — F3341 Major depressive disorder, recurrent, in partial remission: Secondary | ICD-10-CM | POA: Diagnosis not present

## 2021-06-06 DIAGNOSIS — F3341 Major depressive disorder, recurrent, in partial remission: Secondary | ICD-10-CM | POA: Diagnosis not present

## 2021-06-13 DIAGNOSIS — F3341 Major depressive disorder, recurrent, in partial remission: Secondary | ICD-10-CM | POA: Diagnosis not present

## 2021-06-27 DIAGNOSIS — F3341 Major depressive disorder, recurrent, in partial remission: Secondary | ICD-10-CM | POA: Diagnosis not present

## 2021-06-30 DIAGNOSIS — F3341 Major depressive disorder, recurrent, in partial remission: Secondary | ICD-10-CM | POA: Diagnosis not present

## 2021-07-11 DIAGNOSIS — F3341 Major depressive disorder, recurrent, in partial remission: Secondary | ICD-10-CM | POA: Diagnosis not present

## 2021-07-18 DIAGNOSIS — F3341 Major depressive disorder, recurrent, in partial remission: Secondary | ICD-10-CM | POA: Diagnosis not present

## 2021-07-25 DIAGNOSIS — F3341 Major depressive disorder, recurrent, in partial remission: Secondary | ICD-10-CM | POA: Diagnosis not present

## 2021-08-08 DIAGNOSIS — F3341 Major depressive disorder, recurrent, in partial remission: Secondary | ICD-10-CM | POA: Diagnosis not present

## 2021-08-15 DIAGNOSIS — F3341 Major depressive disorder, recurrent, in partial remission: Secondary | ICD-10-CM | POA: Diagnosis not present

## 2021-08-31 ENCOUNTER — Ambulatory Visit (HOSPITAL_COMMUNITY)
Admission: EM | Admit: 2021-08-31 | Discharge: 2021-08-31 | Disposition: A | Discharge status: Home / Self Care | Payer: BC Managed Care – PPO

## 2021-08-31 DIAGNOSIS — R45851 Suicidal ideations: Secondary | ICD-10-CM

## 2021-08-31 NOTE — ED Provider Notes (Signed)
Behavioral Health Urgent Care Medical Screening Exam  Patient Name: Shannon [name] MRN: [id number] Date of Evaluation: 08/31/21 Chief Complaint:   Diagnosis:  Final diagnoses:  Passive suicidal ideations    History of Present illness: Shannon [name] is a 31 y.o. female patient who presents to the [facility] behavioral health urgent care voluntary accompanied by law enforcement for an evaluation.  On evaluation, patient is alert and oriented x4. Her thought process is logical and relevant. However, she appears guarded and forwards little information. Her speech is clear and coherent at a decreased tone. Her mood is depressed and affect is congruent and tearful. She appears well groomed and is casually dressed.   Patient was brought in to the behavioral health urgent care after her friend called the police due to concerns for patient's depression. However, no report was provided from Event organiser. Patient states that she was suicidal earlier today. She denied a suicide plan or intent earlier today. She currently denies suicidal ideations. She reports having suicidal ideations for the past week. She denies self-injurious behaviors. She reports a past history of cutting in 2014. She denies homicidal ideations. She denies auditory or visual hallucinations. There is no objective evidence that the patient is currently responding to internal or external stimuli. She reports feeling depressed after her therapist left on May 25th. She immediately begins crying. However, she denies depressive symptoms. She states that she tried to speak up for herself and the treatment that she felt she needed and her therapist discharged her from the practice. She does reports poor sleep, on average she sleeps 4 hours. She states that she has been eating less. She denies drinking alcohol or using illicit drugs. She states that she lives alone. She denies current outpatient psychiatric or therapy treatment. She states that  she is not taking any prescribed psychiatric medications. When asked what is her goal for being here today, she states "nothing." She states that she is not interested in finding a new therapist.   Patient was asked if this provider could call her friend who called the police to get collateral information. Patient stated yes but then stated that she does not have her phone and does not have her friend's number. I then asked the patient if there is an immediate family member or friend that I could call to safety plan with. She initially stated no but then provided me with the number to call her mother [name] [phone]. I called Ms. Murphy 3 x, no answer and left a hippa compliant voicemail.   Patient offered to stay here at the [facility] behavioral health urgent care for continuous assessment for safety. Patient declined and stated that she does not want to stary. Patient then provided me with the number to her uncle Billie Ruddy [phone] who I called 2x, no answer, voicemail full. Patient states that she does not have any other numbers for me to contact to safety plan.   Patient able to identify her mother and sister as her support persons. She is able to identify healthy coping mechanisms such as photography, doing hair, journaling, playing songs, and walking. Patient does not meet [place] IVC criteria. Safety planning completed with the patient. Patient denies access to weapons/guns. Patient verbally contracts for safety. Patient discharged and recommended to follow up with outpatient therapy.   Psychiatric Specialty Exam  Presentation  General Appearance:Appropriate for Environment  Eye Contact:Fair  Speech:Clear and Coherent  Speech Volume:Decreased  Handedness:No data recorded  Mood and  Affect  Mood:Depressed  Affect:Congruent; Tearful   Thought Process  Thought Processes:Coherent  Descriptions of Associations:Intact  Orientation:Full (Time, Place and  Person)  Thought Content:Logical    Hallucinations:None  Ideas of Reference:None  Suicidal Thoughts:Yes, Passive Without Plan; Without Means to Carry Out; Without Intent  Homicidal Thoughts:No   Sensorium  Memory:Immediate Fair; Remote Fair; Recent Fair  Judgment:Intact  Insight:Shallow   Executive Functions  Concentration:Fair  Attention Span:Fair  [place]  Language:No data recorded  Psychomotor Activity  Psychomotor Activity:Normal   Assets  Assets:Communication Skills; Financial Resources/Insurance; Housing; Leisure Time; Physical Health; Resilience; Talents/Skills; Vocational/Educational   Sleep  Sleep:Fair  Number of hours: No data recorded  No data recorded  Physical Exam: Physical Exam HENT:     Head: Normocephalic.     Nose: Nose normal.  Eyes:     Conjunctiva/sclera: Conjunctivae normal.  Cardiovascular:     Rate and Rhythm: Normal rate.  Pulmonary:     Effort: Pulmonary effort is normal.  Musculoskeletal:        General: Normal range of motion.     Cervical back: Normal range of motion.  Neurological:     Mental Status: She is alert and oriented to person, place, and time.    Review of Systems  Constitutional: Negative.   HENT: Negative.    Eyes: Negative.   Respiratory: Negative.    Cardiovascular: Negative.   Gastrointestinal: Negative.   Genitourinary: Negative.   Musculoskeletal: Negative.   Skin: Negative.   Neurological: Negative.   Endo/Heme/Allergies: Negative.    Blood pressure (!) 151/100, pulse 100, temperature 98.6 F (37 C), temperature source Oral, resp. rate 20, SpO2 100 %. There is no height or weight on file to calculate BMI.  Musculoskeletal: Strength & Muscle Tone: within normal limits Gait & Station: normal Patient leans: N/A   [place] MSE Discharge Disposition for Follow up and Recommendations: Based on my evaluation the patient does not appear to have an emergency medical  condition and can be discharged with resources and follow up care in outpatient services for Medication Management, Individual Therapy, and Group Therapy  Discharge recommendations:  Please see information for follow-up appointment with psychiatry and therapy. Please follow up with your primary care provider for all medical related needs.   Therapy: We recommend that patient participate in individual therapy to address mental health concerns.  Safety:  The patient should abstain from use of illicit substances/drugs and abuse of any medications. If symptoms worsen or do not continue to improve or if the patient becomes actively suicidal or homicidal then it is recommended that the patient return to the closest hospital emergency department, the [facility], or call 911 for further evaluation and treatment. National Suicide Prevention Lifeline 1-800-SUICIDE or (785)724-9111.  About 988 988 offers 24/7 access to trained crisis counselors who can help people experiencing mental health-related distress. People can call or text 988 or chat 988lifeline.org for themselves or if they are worried about a loved one who may need crisis support.   Please contact one of the following facilities to start medication management and therapy services:   [facility] at [place] [address]  [place], [place] [address] [phone]   [place]  [address] [place], [place] [address] [phone]  [place]  [address] [place], [place] [address] [phone]  [place]  [phone] [address]  [place], [place] [address] (336) [place], [place] [address] [phone]  [place]  [address],  [place], [place] [address] [phone]       Marissa Calamity,  NP 08/31/2021, 6:37 PM

## 2021-08-31 NOTE — ED Notes (Signed)
Discharge instructions provided and Pt stated understanding. Pt alert, orient and ambulatory prior to d/c from facility. Personal belongings returned. Safety maintained.  

## 2021-08-31 NOTE — Discharge Instructions (Addendum)
Discharge recommendations:  Please see information for follow-up appointment with psychiatry and therapy. Please follow up with your primary care provider for all medical related needs.   Therapy: We recommend that patient participate in individual therapy to address mental health concerns.  Safety:  The patient should abstain from use of illicit substances/drugs and abuse of any medications. If symptoms worsen or do not continue to improve or if the patient becomes actively suicidal or homicidal then it is recommended that the patient return to the closest hospital emergency department, the [facility], or call 911 for further evaluation and treatment. National Suicide Prevention Lifeline 1-800-SUICIDE or 857 280 0012.  About 988 988 offers 24/7 access to trained crisis counselors who can help people experiencing mental health-related distress. People can call or text 988 or chat 988lifeline.org for themselves or if they are worried about a loved one who may need crisis support.   Please contact one of the following facilities to start medication management and therapy services:   [facility] at [address]  [place], [place] [address] [phone]   [place]  [address] [place], [place] [address] [phone]  [place]  [address] [place], [place] [address] [phone]  [facility]  [address] [place]  [place], [place] [address] [phone]  [facility]  [place], [place] [address] [phone]  [place]  [place] [address],  [place],  [address] [phone]

## 2021-08-31 NOTE — Progress Notes (Signed)
   08/31/21 1819  [name] Triage Screening (Walk-ins at [facility] only)  How Did You Hear About [place]? Family/Friend  What Is the Reason for Your Visit/Call Today? Pt is a 31 yo female who presented via [employer] voluntarily and unaccompanied due to worsening depression and passive SI. Pt stated that her friend was concerned about her depression and called [phone] for police to bring her in for assessment. Pt stated that she has been increasingly depressed for about 1 1/2 weeks. Pt reported that on 08/16/21, her therapist of 6 years who was in private practice sent her a discharge letter and no longer would see her without any discharge planning. Pt stated that she "spoke her mind" to the therapist about her needs and the therapist did not like it and decided to discharge her. Pt became tearful when talking aobut the situation. Pt stated that she has been having passive SI with no plan or true intent of acting on her thoughts. Pt stated she has been using her coping skills and named approximately 6 including journaling, photography, talking to her mother and friends and taking a walk. Pt denied HI, NSSH, AVH, paranoia and any drug/alcohol use. Pt reported multiple suicide attempts in the past and 2 psychiatric IP admissions in 2014 and 2017 following attempts. Pt stated that she was not interested in finding another therapist at this time. Pt denied access to firearms or any hx of violence. Pt would not give permission for this clinician or the NP to contact the friend that called 14 or her sisters but gave permission to contact her mother, [name] who lives in [place]. NP to follow up with call to the pt's mother. She also gave the name and number for her uncle to call for collaterl information and safety planning purposes. Hx of MDD, GAD and PTSD. Pt denied feeling hopeless, helpless or worthless but reported decreased sleep, appetite and energy.  How Long Has This Been Causing You Problems? > than 6 months  Have You  Recently Had Any Thoughts About Hurting Yourself? Yes  How long ago did you have thoughts about hurting yourself? this morning  Are You Planning to Commit Suicide/Harm Yourself At This time? No  Have you Recently Had Thoughts About [place]? No  Are You Planning To Harm Someone At This Time? No  Are you currently experiencing any auditory, visual or other hallucinations? No  Have You Used Any Alcohol or Drugs in the Past 24 Hours? No  Do you have any current medical co-morbidities that require immediate attention? No  Clinician description of patient physical appearance/behavior: Pt was calm, cooperative, alert and appeared oriented. Pt was tearful throughout the assessment. Pt did not appear to be responding to internal stimuli, experiencing delusional thinking or to be intoxicated.  Pt's speech and movement appeared within normal limits and appearance was unremarkable. Pt's mood seemed solemn and depressed, and pt had a flat affect which was congruent. Pt's judgment and insight seemed within normal limits.  What Do You Feel Would Help You the Most Today? Treatment for Depression or other mood problem;Medication(s)  Determination of Need Routine (7 days) (Per NP Darrol Angel pt is psychiatrically cleared.)   Kenley Troop T. Mare Ferrari, [place], [facility], [facility] Triage Specialist [place]

## 2021-08-31 NOTE — ED Notes (Signed)
Patient's belongings were returned. Patient was escorted out to the lobby. No signs and symptoms of any distress noted

## 2021-09-21 ENCOUNTER — Ambulatory Visit (INDEPENDENT_AMBULATORY_CARE_PROVIDER_SITE_OTHER): Payer: BC Managed Care – PPO | Admitting: Family

## 2021-09-21 VITALS — BP 138/74 | HR 96 | Temp 98.5°F | Resp 16 | Ht 69.0 in | Wt 298.0 lb

## 2021-09-21 DIAGNOSIS — F330 Major depressive disorder, recurrent, mild: Secondary | ICD-10-CM | POA: Diagnosis not present

## 2021-09-21 DIAGNOSIS — R7303 Prediabetes: Secondary | ICD-10-CM

## 2021-09-21 DIAGNOSIS — I10 Essential (primary) hypertension: Secondary | ICD-10-CM

## 2021-09-21 DIAGNOSIS — E8881 Metabolic syndrome: Secondary | ICD-10-CM | POA: Diagnosis not present

## 2021-09-21 LAB — COMPREHENSIVE METABOLIC PANEL
ALT: 9 U/L (ref 0–35)
AST: 13 U/L (ref 0–37)
Albumin: 4.5 g/dL (ref 3.5–5.2)
Alkaline Phosphatase: 118 U/L — ABNORMAL HIGH (ref 39–117)
BUN: 8 mg/dL (ref 6–23)
CO2: 25 mEq/L (ref 19–32)
Calcium: 9.3 mg/dL (ref 8.4–10.5)
Chloride: 104 mEq/L (ref 96–112)
Creatinine, Ser: 1.1 mg/dL (ref 0.40–1.20)
GFR: 66.9 mL/min (ref >60.00)
Glucose, Bld: 88 mg/dL (ref 70–99)
Potassium: 4.1 mEq/L (ref 3.5–5.1)
Sodium: 137 mEq/L (ref 135–145)
Total Bilirubin: 1.3 mg/dL — ABNORMAL HIGH (ref 0.2–1.2)
Total Protein: 7.7 g/dL (ref 6.0–8.3)

## 2021-09-21 LAB — HEMOGLOBIN A1C: Hgb A1c MFr Bld: 6.1 % (ref 4.6–6.5)

## 2021-09-21 MED ORDER — METFORMIN HCL 500 MG PO TABS: 500.0000 mg | ORAL_TABLET | Freq: Every morning | ORAL | 1 refills | Status: AC

## 2021-09-21 MED ORDER — LOSARTAN POTASSIUM 25 MG PO TABS: ORAL_TABLET | ORAL | 1 refills | Status: DC

## 2021-09-21 MED ORDER — HYDROCHLOROTHIAZIDE 12.5 MG PO TABS: ORAL_TABLET | ORAL | 0 refills | Status: AC

## 2021-09-21 NOTE — Progress Notes (Signed)
Shannon [name] is a 31 y.o. female with the following history as recorded in EpicCare:  Patient Active Problem List   Diagnosis Date Noted   Prediabetes 06/14/2019   Other hyperlipidemia 04/22/2018   Insulin resistance 04/02/2018   Shortness of breath on exertion 12/29/2017   Essential hypertension 12/29/2017   Vitamin D deficiency 12/29/2017   Class 2 severe obesity with serious comorbidity and body mass index (BMI) of 36.0 to 36.9 in adult [facility]) 08/11/2017   Routine general medical examination at a health care facility 08/10/2017   Hypertension 12/30/2016   Slurred speech 12/30/2016   Iron deficiency anemia 09/24/2016   Fatigue 08/06/2016   Pulmonary embolism with acute cor pulmonale ([place]) 07/04/2016   Hypokalemia 07/04/2016   Prolonged QT interval 07/04/2016   Depression with anxiety 07/04/2016   Adjustment disorder with emotional disturbance 03/31/2016   Major depressive disorder, recurrent severe without psychotic features ([place]) 01/10/2016   Major depressive disorder, recurrent episode, mild ([place]) 01/01/2016   Suicide ideation 02/16/2013    Current Outpatient Medications  Medication Sig Dispense Refill   hydrochlorothiazide (HYDRODIURIL) 12.5 MG tablet TAKE 1 TO 2 TABLETS BY MOUTH DAILY AS NEEDED FOR SWELLING 180 tablet 0   losartan (COZAAR) 25 MG tablet TAKE 1 TABLET(25 MG) BY MOUTH DAILY 90 tablet 1   metFORMIN (GLUCOPHAGE) 500 MG tablet Take 1 tablet (500 mg total) by mouth in the morning. 90 tablet 1   No current facility-administered medications for this visit.    Allergies: Bupropion  Past Medical History:  Diagnosis Date   Anemia    Anxiety    Constipation    Depression    Dyspnea    HTN (hypertension)    Knee pain    Leg edema    Medical history non-contributory    Obesity    Pain in both feet    Prolonged QT interval    Pulmonary thromboembolism ([place])    Suicide ideation     Past Surgical History:  Procedure Laterality Date   NO PAST SURGERIES      TEETH REMOVAL  07/2017    Family History  Problem Relation Age of Onset   Healthy Mother    Hypertension Mother    Obesity Mother    Healthy Father    Pulmonary embolism Neg Hx    Deep vein thrombosis Neg Hx     Social History   Tobacco Use   Smoking status: Never   Smokeless tobacco: Never  Substance Use Topics   Alcohol use: No    Subjective:   Patient presents for 6 month follow up on chronic care needs; was also seen at [facility] earlier this month and encouraged to establish with psychiatrist/ counseling and see PCP as well; no acute suicidal ideations; admits she was very overwhelmed at thought of losing therapist that she had worked with for the past 6 years and feels this triggered her emotions; is scheduled with new provider but that appointment is not for 3 months;     Objective:  Vitals:   09/21/21 0809  BP: 140/74  Pulse: 96  Resp: 16  Temp: 98.5 F (36.9 C)  TempSrc: Oral  SpO2: 99%  Weight: 298 lb (135.2 kg)  Height: $Remove'5\' 9"'[id number]$  (1.753 m)    General: Well developed, well nourished, in no acute distress  Skin : Warm and dry.  Head: Normocephalic and atraumatic  Eyes: Sclera and conjunctiva clear; pupils round and reactive to light; extraocular movements intact  Ears: External normal; canals clear;  tympanic membranes normal  Oropharynx: Pink, supple. No suspicious lesions  Neck: Supple without thyromegaly, adenopathy  Lungs: Respirations unlabored; clear to auscultation bilaterally without wheeze, rales, rhonchi  CVS exam: normal rate and regular rhythm.  Neurologic: Alert and oriented; speech intact; face symmetrical; moves all extremities well; CNII-XII intact without focal deficit   Assessment:  1. Essential hypertension   2. Insulin resistance   3. Pre-diabetes   4. Major depressive disorder, recurrent episode, mild (HCC)     Plan:  Stable; refill updated; encouraged weight loss- plan for 15 pounds over next 3 months;  3.   Check Hgba1c today; refill  updated; 4.   Patient is not suicidal; she is given contact information for other offices that she can reach out to and see if she can be seen sooner;   No follow-ups on file.  Orders Placed This Encounter  Procedures   Comp Met (CMET)   Hemoglobin A1c    Requested Prescriptions   Signed Prescriptions Disp Refills   hydrochlorothiazide (HYDRODIURIL) 12.5 MG tablet 180 tablet 0    Sig: TAKE 1 TO 2 TABLETS BY MOUTH DAILY AS NEEDED FOR SWELLING   losartan (COZAAR) 25 MG tablet 90 tablet 1    Sig: TAKE 1 TABLET(25 MG) BY MOUTH DAILY   metFORMIN (GLUCOPHAGE) 500 MG tablet 90 tablet 1    Sig: Take 1 tablet (500 mg total) by mouth in the morning.

## 2021-10-31 ENCOUNTER — Encounter (INDEPENDENT_AMBULATORY_CARE_PROVIDER_SITE_OTHER): Payer: Self-pay

## 2021-12-25 ENCOUNTER — Ambulatory Visit (INDEPENDENT_AMBULATORY_CARE_PROVIDER_SITE_OTHER): Payer: BLUE CROSS/BLUE SHIELD | Admitting: Family

## 2021-12-25 ENCOUNTER — Encounter: Payer: Self-pay | Admitting: Family

## 2021-12-25 VITALS — BP 140/90 | HR 93 | Temp 98.8°F | Ht 69.0 in | Wt 302.5 lb

## 2021-12-25 DIAGNOSIS — I10 Essential (primary) hypertension: Secondary | ICD-10-CM | POA: Diagnosis not present

## 2021-12-25 DIAGNOSIS — R7303 Prediabetes: Secondary | ICD-10-CM | POA: Diagnosis not present

## 2021-12-25 DIAGNOSIS — R5383 Other fatigue: Secondary | ICD-10-CM | POA: Diagnosis not present

## 2021-12-25 LAB — CBC WITH DIFFERENTIAL/PLATELET
Basophils Absolute: 0 10*3/uL (ref 0.0–0.1)
Basophils Relative: 0.8 % (ref 0.0–3.0)
Eosinophils Absolute: 0.2 10*3/uL (ref 0.0–0.7)
Eosinophils Relative: 2.9 % (ref 0.0–5.0)
HCT: 37.4 % (ref 36.0–46.0)
Hemoglobin: 12.1 g/dL (ref 12.0–15.0)
Lymphocytes Relative: 38.9 % (ref 12.0–46.0)
Lymphs Abs: 2.3 10*3/uL (ref 0.7–4.0)
MCHC: 32.4 g/dL (ref 30.0–36.0)
MCV: 77.5 fl — ABNORMAL LOW (ref 78.0–100.0)
Monocytes Absolute: 0.5 10*3/uL (ref 0.1–1.0)
Monocytes Relative: 8.8 % (ref 3.0–12.0)
Neutro Abs: 2.9 10*3/uL (ref 1.4–7.7)
Neutrophils Relative %: 48.6 % (ref 43.0–77.0)
Platelets: 366 10*3/uL (ref 150.0–400.0)
RBC: 4.83 Mil/uL (ref 3.87–5.11)
RDW: 14.7 % (ref 11.5–15.5)
WBC: 5.9 10*3/uL (ref 4.0–10.5)

## 2021-12-25 LAB — HEMOGLOBIN A1C: Hgb A1c MFr Bld: 6.3 % (ref 4.6–6.5)

## 2021-12-25 LAB — COMPREHENSIVE METABOLIC PANEL
ALT: 8 U/L (ref 0–35)
AST: 14 U/L (ref 0–37)
Albumin: 4.3 g/dL (ref 3.5–5.2)
Alkaline Phosphatase: 115 U/L (ref 39–117)
BUN: 8 mg/dL (ref 6–23)
CO2: 25 mEq/L (ref 19–32)
Calcium: 9.2 mg/dL (ref 8.4–10.5)
Chloride: 102 mEq/L (ref 96–112)
Creatinine, Ser: 1.09 mg/dL (ref 0.40–1.20)
GFR: 67.52 mL/min (ref >60.00)
Glucose, Bld: 100 mg/dL — ABNORMAL HIGH (ref 70–99)
Potassium: 4.1 mEq/L (ref 3.5–5.1)
Sodium: 136 mEq/L (ref 135–145)
Total Bilirubin: 1 mg/dL (ref 0.2–1.2)
Total Protein: 7.7 g/dL (ref 6.0–8.3)

## 2021-12-25 LAB — TSH: TSH: 1.66 u[IU]/mL (ref 0.35–5.50)

## 2021-12-25 MED ORDER — LOSARTAN POTASSIUM 50 MG PO TABS: 50.0000 mg | ORAL_TABLET | Freq: Every day | ORAL | 1 refills | Status: AC

## 2021-12-25 NOTE — Progress Notes (Signed)
Shannon [name] is a 31 y.o. female with the following history as recorded in EpicCare:  Patient Active Problem List   Diagnosis Date Noted   Prediabetes 06/14/2019   Other hyperlipidemia 04/22/2018   Insulin resistance 04/02/2018   Shortness of breath on exertion 12/29/2017   Essential hypertension 12/29/2017   Vitamin D deficiency 12/29/2017   Class 2 severe obesity with serious comorbidity and body mass index (BMI) of 36.0 to 36.9 in adult [facility]) 08/11/2017   Routine general medical examination at a health care facility 08/10/2017   Hypertension 12/30/2016   Slurred speech 12/30/2016   Iron deficiency anemia 09/24/2016   Fatigue 08/06/2016   Pulmonary embolism with acute cor pulmonale ([place]) 07/04/2016   Hypokalemia 07/04/2016   Prolonged QT interval 07/04/2016   Depression with anxiety 07/04/2016   Adjustment disorder with emotional disturbance 03/31/2016   Major depressive disorder, recurrent severe without psychotic features ([place]) 01/10/2016   Major depressive disorder, recurrent episode, mild ([place]) 01/01/2016   Suicide ideation 02/16/2013    Current Outpatient Medications  Medication Sig Dispense Refill   hydrochlorothiazide (HYDRODIURIL) 12.5 MG tablet TAKE 1 TO 2 TABLETS BY MOUTH DAILY AS NEEDED FOR SWELLING 180 tablet 0   metFORMIN (GLUCOPHAGE) 500 MG tablet Take 1 tablet (500 mg total) by mouth in the morning. 90 tablet 1   losartan (COZAAR) 50 MG tablet Take 1 tablet (50 mg total) by mouth daily. 90 tablet 1   No current facility-administered medications for this visit.    Allergies: Bupropion  Past Medical History:  Diagnosis Date   Anemia    Anxiety    Constipation    Depression    Dyspnea    HTN (hypertension)    Knee pain    Leg edema    Medical history non-contributory    Obesity    Pain in both feet    Prolonged QT interval    Pulmonary thromboembolism ([place])    Suicide ideation     Past Surgical History:  Procedure Laterality Date   NO PAST  SURGERIES     TEETH REMOVAL  07/2017    Family History  Problem Relation Age of Onset   Healthy Mother    Hypertension Mother    Obesity Mother    Healthy Father    Pulmonary embolism Neg Hx    Deep vein thrombosis Neg Hx     Social History   Tobacco Use   Smoking status: Never   Smokeless tobacco: Never  Substance Use Topics   Alcohol use: No    Subjective:   Follow up on hypertension; notes that stress level has been up recently; has been feeling more "weak" recently and would like to get her labs checked; Denies any chest pain, shortness of breath, blurred vision or headache      Objective:  Vitals:   12/25/21 0909  BP: (!) 140/90  Pulse: 93  Temp: 98.8 F (37.1 C)  TempSrc: Oral  SpO2: 95%  Weight: (!) 302 lb 8 oz (137.2 kg)  Height: 5' 9" (1.753 m)    General: Well developed, well nourished, in no acute distress  Skin : Warm and dry.  Head: Normocephalic and atraumatic  Lungs: Respirations unlabored; clear to auscultation bilaterally without wheeze, rales, rhonchi  CVS exam: normal rate and regular rhythm.  Neurologic: Alert and oriented; speech intact; face symmetrical; moves all extremities well; CNII-XII intact without focal deficit   Assessment:  1. Other fatigue   2. Essential hypertension   3. Pre-diabetes  Plan:  Will update labs today; increase Losartan to 50 mg daily; do agree that work related stress could be contributing to majority of her symptoms; follow up in 3 months, sooner prn based on lab results.   Return in about 3 months (around 03/27/2022).  Orders Placed This Encounter  Procedures   CBC with Differential/Platelet   Comp Met (CMET)   TSH   Hemoglobin A1c    Requested Prescriptions   Signed Prescriptions Disp Refills   losartan (COZAAR) 50 MG tablet 90 tablet 1    Sig: Take 1 tablet (50 mg total) by mouth daily.     

## 2022-01-18 ENCOUNTER — Other Ambulatory Visit: Payer: Self-pay | Admitting: Family

## 2022-01-18 ENCOUNTER — Encounter: Payer: Self-pay | Admitting: Family

## 2022-01-18 DIAGNOSIS — D649 Anemia, unspecified: Secondary | ICD-10-CM

## 2022-01-22 ENCOUNTER — Other Ambulatory Visit (INDEPENDENT_AMBULATORY_CARE_PROVIDER_SITE_OTHER): Payer: Federal, State, Local not specified - PPO

## 2022-01-22 DIAGNOSIS — D649 Anemia, unspecified: Secondary | ICD-10-CM | POA: Diagnosis not present

## 2022-01-23 LAB — IRON,TIBC AND FERRITIN PANEL
%SAT: 10 % (calc) — ABNORMAL LOW (ref 16–45)
Ferritin: 11 ng/mL — ABNORMAL LOW (ref 16–154)
Iron: 35 ug/dL — ABNORMAL LOW (ref 40–190)
TIBC: 341 mcg/dL (calc) (ref 250–450)

## 2022-01-24 ENCOUNTER — Other Ambulatory Visit: Payer: Self-pay | Admitting: Family

## 2022-01-24 DIAGNOSIS — D649 Anemia, unspecified: Secondary | ICD-10-CM

## 2022-01-24 DIAGNOSIS — R790 Abnormal level of blood mineral: Secondary | ICD-10-CM

## 2022-01-28 ENCOUNTER — Telehealth: Payer: Self-pay | Admitting: Hematology and Oncology

## 2022-01-28 NOTE — Telephone Encounter (Signed)
Scheduled appointment per 11/06 referral. Patient is aware of appointment date and time. Patient is aware to arrive 15 mins prior to appointment time and to bring updated insurance cards. Patient is aware of location.

## 2022-02-11 ENCOUNTER — Emergency Department (HOSPITAL_COMMUNITY): Payer: Federal, State, Local not specified - PPO

## 2022-02-11 ENCOUNTER — Emergency Department (HOSPITAL_COMMUNITY)
Admission: EM | Admit: 2022-02-11 | Discharge: 2022-02-11 | Disposition: A | Discharge status: Home / Self Care | Payer: Federal, State, Local not specified - PPO | Attending: Emergency Medicine | Admitting: Emergency Medicine

## 2022-02-11 DIAGNOSIS — Z23 Encounter for immunization: Secondary | ICD-10-CM | POA: Diagnosis not present

## 2022-02-11 DIAGNOSIS — S81812A Laceration without foreign body, left lower leg, initial encounter: Secondary | ICD-10-CM | POA: Diagnosis not present

## 2022-02-11 DIAGNOSIS — M79644 Pain in right finger(s): Secondary | ICD-10-CM | POA: Insufficient documentation

## 2022-02-11 DIAGNOSIS — Y92410 Unspecified street and highway as the place of occurrence of the external cause: Secondary | ICD-10-CM | POA: Diagnosis not present

## 2022-02-11 DIAGNOSIS — R58 Hemorrhage, not elsewhere classified: Secondary | ICD-10-CM | POA: Diagnosis not present

## 2022-02-11 MED ORDER — TETANUS-DIPHTH-ACELL PERTUSSIS 5-2.5-18.5 LF-MCG/0.5 IM SUSY
0.5000 mL | PREFILLED_SYRINGE | Freq: Once | INTRAMUSCULAR | Status: AC
  Administered 2022-02-11: 0.5 mL via INTRAMUSCULAR
  Filled 2022-02-11: qty 0.5

## 2022-02-11 MED ORDER — BACITRACIN ZINC 500 UNIT/GM EX OINT
TOPICAL_OINTMENT | CUTANEOUS | Status: AC
  Administered 2022-02-11: 1 via TOPICAL
  Filled 2022-02-11: qty 0.9

## 2022-02-11 MED ORDER — LIDOCAINE-EPINEPHRINE (PF) 2 %-1:200000 IJ SOLN
20.0000 mL | Freq: Once | INTRAMUSCULAR | Status: AC
  Administered 2022-02-11: 20 mL
  Filled 2022-02-11: qty 20

## 2022-02-11 MED ORDER — OXYCODONE-ACETAMINOPHEN 5-325 MG PO TABS
1.0000 | ORAL_TABLET | Freq: Once | ORAL | Status: AC
  Administered 2022-02-11: 1 via ORAL
  Filled 2022-02-11: qty 1

## 2022-02-11 MED ORDER — BACITRACIN ZINC 500 UNIT/GM EX OINT: TOPICAL_OINTMENT | CUTANEOUS | Status: DC

## 2022-02-11 MED ORDER — ONDANSETRON 4 MG PO TBDP
4.0000 mg | ORAL_TABLET | Freq: Once | ORAL | Status: AC
  Administered 2022-02-11: 4 mg via ORAL
  Filled 2022-02-11: qty 1

## 2022-02-11 NOTE — Discharge Instructions (Addendum)
Your finger is not broken--the xray shows intact bone. Ice and take tylenol and ibuprofen for pain.   Please use Tylenol or ibuprofen for pain.  You may use 600 mg ibuprofen every 6 hours or 1000 mg of Tylenol every 6 hours.  You may choose to alternate between the 2.  This would be most effective.  Not to exceed 4 g of Tylenol within 24 hours.  Not to exceed 3200 mg ibuprofen 24 hours.   You will need to have the staples removed in 7 to 10 days.  Keep area clean with warm soap and water dab dry and cover bacitracin, Neosporin or Vaseline to keep the area moist.  If you notice any redness or swelling or any pus coming from the laceration please return to the emergency room immediately. You may have the staples taken out either here in the emergency room or at a primary care office or urgent care.
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
## 2022-02-13 ENCOUNTER — Encounter (HOSPITAL_COMMUNITY): Payer: Self-pay | Admitting: Emergency Medicine

## 2022-02-13 ENCOUNTER — Other Ambulatory Visit: Payer: Self-pay

## 2022-02-13 ENCOUNTER — Emergency Department (HOSPITAL_COMMUNITY)
Admission: EM | Admit: 2022-02-13 | Discharge: 2022-02-13 | Disposition: A | Discharge status: Home / Self Care | Payer: Federal, State, Local not specified - PPO | Attending: Emergency Medicine | Admitting: Emergency Medicine

## 2022-02-13 ENCOUNTER — Emergency Department (HOSPITAL_COMMUNITY): Payer: Federal, State, Local not specified - PPO

## 2022-02-13 DIAGNOSIS — R519 Headache, unspecified: Secondary | ICD-10-CM | POA: Diagnosis not present

## 2022-02-13 LAB — PREGNANCY, URINE: Preg Test, Ur: NEGATIVE

## 2022-02-13 MED ORDER — NAPROXEN 375 MG PO TABS: 375.0000 mg | ORAL_TABLET | Freq: Two times a day (BID) | ORAL | 0 refills | Status: AC

## 2022-02-13 MED ORDER — CYCLOBENZAPRINE HCL 10 MG PO TABS: 10.0000 mg | ORAL_TABLET | Freq: Two times a day (BID) | ORAL | 0 refills | Status: AC | PRN
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
## 2022-02-18 ENCOUNTER — Inpatient Hospital Stay (HOSPITAL_COMMUNITY)
Admission: EM | Admit: 2022-02-18 | Discharge: 2022-02-22 | DRG: 208 | Disposition: E | Payer: Federal, State, Local not specified - PPO | Source: Ambulatory Visit | Attending: Internal Medicine | Admitting: Internal Medicine

## 2022-02-18 ENCOUNTER — Encounter (HOSPITAL_COMMUNITY): Payer: Self-pay

## 2022-02-18 ENCOUNTER — Other Ambulatory Visit: Payer: Self-pay

## 2022-02-18 ENCOUNTER — Inpatient Hospital Stay: Payer: Federal, State, Local not specified - PPO

## 2022-02-18 ENCOUNTER — Emergency Department (HOSPITAL_COMMUNITY): Payer: Federal, State, Local not specified - PPO

## 2022-02-18 ENCOUNTER — Inpatient Hospital Stay
Payer: Federal, State, Local not specified - PPO | Attending: Hematology and Oncology | Admitting: Hematology and Oncology

## 2022-02-18 VITALS — BP 129/90 | HR 128 | Temp 97.7°F | Resp 18 | Wt 301.4 lb

## 2022-02-18 DIAGNOSIS — D500 Iron deficiency anemia secondary to blood loss (chronic): Secondary | ICD-10-CM

## 2022-02-18 DIAGNOSIS — Z8249 Family history of ischemic heart disease and other diseases of the circulatory system: Secondary | ICD-10-CM | POA: Diagnosis not present

## 2022-02-18 DIAGNOSIS — I468 Cardiac arrest due to other underlying condition: Secondary | ICD-10-CM | POA: Diagnosis not present

## 2022-02-18 DIAGNOSIS — Z86718 Personal history of other venous thrombosis and embolism: Secondary | ICD-10-CM

## 2022-02-18 DIAGNOSIS — I2489 Other forms of acute ischemic heart disease: Secondary | ICD-10-CM | POA: Diagnosis present

## 2022-02-18 DIAGNOSIS — I82452 Acute embolism and thrombosis of left peroneal vein: Secondary | ICD-10-CM | POA: Diagnosis not present

## 2022-02-18 DIAGNOSIS — R7303 Prediabetes: Secondary | ICD-10-CM | POA: Diagnosis not present

## 2022-02-18 DIAGNOSIS — I10 Essential (primary) hypertension: Secondary | ICD-10-CM | POA: Diagnosis not present

## 2022-02-18 DIAGNOSIS — I2699 Other pulmonary embolism without acute cor pulmonale: Secondary | ICD-10-CM | POA: Diagnosis not present

## 2022-02-18 DIAGNOSIS — Z888 Allergy status to other drugs, medicaments and biological substances status: Secondary | ICD-10-CM | POA: Diagnosis not present

## 2022-02-18 DIAGNOSIS — R0603 Acute respiratory distress: Secondary | ICD-10-CM | POA: Diagnosis not present

## 2022-02-18 DIAGNOSIS — I82442 Acute embolism and thrombosis of left tibial vein: Secondary | ICD-10-CM | POA: Diagnosis present

## 2022-02-18 DIAGNOSIS — E66813 Obesity, class 3: Secondary | ICD-10-CM | POA: Diagnosis present

## 2022-02-18 DIAGNOSIS — I2609 Other pulmonary embolism with acute cor pulmonale: Secondary | ICD-10-CM | POA: Diagnosis not present

## 2022-02-18 DIAGNOSIS — R0602 Shortness of breath: Secondary | ICD-10-CM | POA: Diagnosis not present

## 2022-02-18 DIAGNOSIS — I82492 Acute embolism and thrombosis of other specified deep vein of left lower extremity: Secondary | ICD-10-CM | POA: Diagnosis not present

## 2022-02-18 DIAGNOSIS — I82411 Acute embolism and thrombosis of right femoral vein: Secondary | ICD-10-CM | POA: Diagnosis present

## 2022-02-18 DIAGNOSIS — I82432 Acute embolism and thrombosis of left popliteal vein: Secondary | ICD-10-CM | POA: Diagnosis present

## 2022-02-18 DIAGNOSIS — Z79899 Other long term (current) drug therapy: Secondary | ICD-10-CM

## 2022-02-18 DIAGNOSIS — R42 Dizziness and giddiness: Secondary | ICD-10-CM | POA: Diagnosis present

## 2022-02-18 DIAGNOSIS — Z86711 Personal history of pulmonary embolism: Secondary | ICD-10-CM | POA: Diagnosis not present

## 2022-02-18 DIAGNOSIS — Z6841 Body Mass Index (BMI) 40.0 and over, adult: Secondary | ICD-10-CM

## 2022-02-18 DIAGNOSIS — I82409 Acute embolism and thrombosis of unspecified deep veins of unspecified lower extremity: Secondary | ICD-10-CM | POA: Diagnosis not present

## 2022-02-18 DIAGNOSIS — Z7984 Long term (current) use of oral hypoglycemic drugs: Secondary | ICD-10-CM | POA: Diagnosis not present

## 2022-02-18 DIAGNOSIS — I2782 Chronic pulmonary embolism: Secondary | ICD-10-CM | POA: Diagnosis not present

## 2022-02-18 LAB — CBC WITH DIFFERENTIAL/PLATELET
Abs Immature Granulocytes: 0.05 10*3/uL (ref 0.00–0.07)
Basophils Absolute: 0 10*3/uL (ref 0.0–0.1)
Basophils Relative: 1 %
Eosinophils Absolute: 0.1 10*3/uL (ref 0.0–0.5)
Eosinophils Relative: 1 %
HCT: 42.2 % (ref 36.0–46.0)
Hemoglobin: 12.9 g/dL (ref 12.0–15.0)
Immature Granulocytes: 1 %
Lymphocytes Relative: 37 %
Lymphs Abs: 3.1 10*3/uL (ref 0.7–4.0)
MCH: 24.7 pg — ABNORMAL LOW (ref 26.0–34.0)
MCHC: 30.6 g/dL (ref 30.0–36.0)
MCV: 80.7 fL (ref 80.0–100.0)
Monocytes Absolute: 0.5 10*3/uL (ref 0.1–1.0)
Monocytes Relative: 6 %
Neutro Abs: 4.4 10*3/uL (ref 1.7–7.7)
Neutrophils Relative %: 54 %
Platelets: 322 10*3/uL (ref 150–400)
RBC: 5.23 MIL/uL — ABNORMAL HIGH (ref 3.87–5.11)
RDW: 14.6 % (ref 11.5–15.5)
WBC: 8.2 10*3/uL (ref 4.0–10.5)
nRBC: 0 % (ref 0.0–0.2)

## 2022-02-18 LAB — COMPREHENSIVE METABOLIC PANEL
ALT: 17 U/L (ref 0–44)
AST: 26 U/L (ref 15–41)
Albumin: 4.6 g/dL (ref 3.5–5.0)
Alkaline Phosphatase: 102 U/L (ref 38–126)
Anion gap: 10 (ref 5–15)
BUN: 15 mg/dL (ref 6–20)
CO2: 23 mmol/L (ref 22–32)
Calcium: 9.4 mg/dL (ref 8.9–10.3)
Chloride: 106 mmol/L (ref 98–111)
Creatinine, Ser: 1.15 mg/dL — ABNORMAL HIGH (ref 0.44–1.00)
GFR, Estimated: >60 mL/min (ref >60)
Glucose, Bld: 112 mg/dL — ABNORMAL HIGH (ref 70–99)
Potassium: 3.9 mmol/L (ref 3.5–5.1)
Sodium: 139 mmol/L (ref 135–145)
Total Bilirubin: 0.8 mg/dL (ref 0.3–1.2)
Total Protein: 8.9 g/dL — ABNORMAL HIGH (ref 6.5–8.1)

## 2022-02-18 LAB — BRAIN NATRIURETIC PEPTIDE: B Natriuretic Peptide: 597.2 pg/mL — ABNORMAL HIGH (ref 0.0–100.0)

## 2022-02-18 LAB — PROTIME-INR
INR: 1.1 (ref 0.8–1.2)
Prothrombin Time: 14.1 seconds (ref 11.4–15.2)

## 2022-02-18 LAB — I-STAT BETA HCG BLOOD, ED (MC, WL, AP ONLY): I-stat hCG, quantitative: <5 m[IU]/mL (ref <5)

## 2022-02-18 LAB — HEPARIN LEVEL (UNFRACTIONATED): Heparin Unfractionated: 0.75 IU/mL — ABNORMAL HIGH (ref 0.30–0.70)

## 2022-02-18 LAB — LIPASE, BLOOD: Lipase: 35 U/L (ref 11–51)

## 2022-02-18 LAB — TROPONIN I (HIGH SENSITIVITY)
Troponin I (High Sensitivity): 174 ng/L (ref <18)
Troponin I (High Sensitivity): 153 ng/L (ref <18)

## 2022-02-18 LAB — APTT: aPTT: 31 seconds (ref 24–36)

## 2022-02-18 MED ORDER — HEPARIN (PORCINE) 25000 UT/250ML-% IV SOLN
1500.0000 [IU]/h | INTRAVENOUS | Status: DC
  Administered 2022-02-18: 1600 [IU]/h via INTRAVENOUS
  Administered 2022-02-19 (×2): 1500 [IU]/h via INTRAVENOUS
  Filled 2022-02-18 (×3): qty 250

## 2022-02-18 MED ORDER — ACETAMINOPHEN 325 MG PO TABS: 650.0000 mg | ORAL_TABLET | Freq: Four times a day (QID) | ORAL | Status: DC | PRN

## 2022-02-18 MED ORDER — OXYCODONE HCL 5 MG PO TABS: 5.0000 mg | ORAL_TABLET | ORAL | Status: DC | PRN

## 2022-02-18 MED ORDER — LOSARTAN POTASSIUM 50 MG PO TABS
50.0000 mg | ORAL_TABLET | Freq: Every day | ORAL | Status: DC
  Administered 2022-02-19: 50 mg via ORAL
  Filled 2022-02-18: qty 2

## 2022-02-18 MED ORDER — ASPIRIN 81 MG PO CHEW
324.0000 mg | CHEWABLE_TABLET | Freq: Once | ORAL | Status: AC
  Administered 2022-02-18: 324 mg via ORAL
  Filled 2022-02-18: qty 4

## 2022-02-18 MED ORDER — ADULT MULTIVITAMIN W/MINERALS CH
1.0000 | ORAL_TABLET | Freq: Every day | ORAL | Status: DC
  Administered 2022-02-18 – 2022-02-19 (×2): 1 via ORAL
  Filled 2022-02-18 (×2): qty 1

## 2022-02-18 MED ORDER — ACETAMINOPHEN 650 MG RE SUPP: 650.0000 mg | Freq: Four times a day (QID) | RECTAL | Status: DC | PRN

## 2022-02-18 MED ORDER — IOHEXOL 350 MG/ML SOLN
75.0000 mL | Freq: Once | INTRAVENOUS | Status: AC | PRN
  Administered 2022-02-18: 75 mL via INTRAVENOUS

## 2022-02-18 MED ORDER — HEPARIN BOLUS VIA INFUSION
7000.0000 [IU] | Freq: Once | INTRAVENOUS | Status: AC
  Administered 2022-02-18: 7000 [IU] via INTRAVENOUS
  Filled 2022-02-18: qty 7000

## 2022-02-18 NOTE — ED Triage Notes (Signed)
Pt arrived via wheelchair from [facility], was being seen today for iron deficiency, NO cancer hx. Staff states pt breathing labored. States this started last night, states feels similar to PE in the past.

## 2022-02-18 NOTE — ED Notes (Signed)
Pt placed on 1L of O2 for desats into the high 80's/low 90's.

## 2022-02-18 NOTE — Progress Notes (Signed)
[facility] [phone]   Fax:([phone]) [facility] NOTE  Patient Care Team: Marrian Salvage, FNP as PCP - General (Internal Medicine)  Hematological/Oncological History # Iron Deficiency without Anemia 02/03/2022: establish care with Dr. Lorenso Courier   #Provoked Pulmonary Embolism 02/15/2022: establish care with Dr. Lorenso Courier   CHIEF COMPLAINTS/PURPOSE OF CONSULTATION:  "Iron Deficiency "  HISTORY OF PRESENTING ILLNESS:  Shannon [name] 31 y.o. female with medical history significant for depression, HTN, obesity, and prior provoked pulmonary embolism in the setting of birth control use who presents for evaluation of iron deficiency without anemia.  On review of the previous records when the CT PE study on 07/04/2016 at which time she was found to have pulmonary emboli arising from the distal main pulmonary arteries extending into multiple peripheral branches bilaterally.  She was treated with Eliquis at that time.  She presents today for evaluation of iron deficiency anemia though she is currently showing symptoms of a recurrent VTE.  Due to concern for iron deficiency she was referred to hematology for further evaluation and management.  On exam today Shannon [name] reports that she has developed worsening shortness of breath.  She reports that she was quite dizzy last night and would have called EMS but was afraid that she would not be able to get to the door to answer it for them.  She reports that her heart has been racing.  She notes that her prior pulmonary embolism in 2018 was thought to be provoked by birth control pills.  She notes that she is having shortness of breath currently but no pain.  In regards to her menstrual cycles she reports they last approximately 7 days with the heaviest days being the second to the fourth.  She reports that she goes through about 5 soaked pads/tampons on those heaviest days.  She reports she eats red meat approximately  once per week.  She reports she enjoys "hibachi steak".  She notes that she is taken iron pills before in the past and does not have any stomach issues with the medication.  On further discussion she reports that she is a never smoker never drinker and currently works as a Building control surveyor.  She currently works from home.  She has no children and does not suspect that she may be pregnant.  She reports that her mother suffered from hypertension type 2 diabetes and her maternal aunt had breast cancer.  She reports that other members of the family have heart disease strokes, and heart attack mostly on the mother side.  She currently denies any fevers, chills, sweats, nausea, ming or diarrhea.  A full 10 point ROS was otherwise negative.  MEDICAL HISTORY:  Past Medical History:  Diagnosis Date   Anemia    Anxiety    Constipation    Depression    Dyspnea    HTN (hypertension)    Knee pain    Leg edema    Medical history non-contributory    Obesity    Pain in both feet    Prolonged QT interval    Pulmonary thromboembolism ([place])    Suicide ideation     SURGICAL HISTORY: Past Surgical History:  Procedure Laterality Date   NO PAST SURGERIES     TEETH REMOVAL  07/2017    SOCIAL HISTORY: Social History   Socioeconomic History   Marital status: Single    Spouse name: Not on file   Number of children: 0   Years of education: 36  Highest education level: Not on file  Occupational History   Occupation: Gaffer  Tobacco Use   Smoking status: Never   Smokeless tobacco: Never  Vaping Use   Vaping Use: Never used  Substance and Sexual Activity   Alcohol use: No   Drug use: No   Sexual activity: Never    Birth control/protection: None, Abstinence    Comment: VIRGIN  Other Topics Concern   Not on file  Social History Narrative   Fun/Hobby: Going to the gym, writing.   Social Determinants of Health   Financial Resource Strain: Not on file  Food  Insecurity: Not on file  Transportation Needs: Not on file  Physical Activity: Not on file  Stress: Not on file  Social Connections: Not on file  Intimate Partner Violence: Not on file    FAMILY HISTORY: Family History  Problem Relation Age of Onset   Healthy Mother    Hypertension Mother    Obesity Mother    Healthy Father    Pulmonary embolism Neg Hx    Deep vein thrombosis Neg Hx     ALLERGIES:  is allergic to bupropion.  MEDICATIONS:  Current Outpatient Medications  Medication Sig Dispense Refill   Multiple Vitamin (MULTIVITAMIN) tablet Take 1 tablet by mouth daily.     cyclobenzaprine (FLEXERIL) 10 MG tablet Take 1 tablet (10 mg total) by mouth 2 (two) times daily as needed for muscle spasms. 20 tablet 0   hydrochlorothiazide (HYDRODIURIL) 12.5 MG tablet TAKE 1 TO 2 TABLETS BY MOUTH DAILY AS NEEDED FOR SWELLING 180 tablet 0   losartan (COZAAR) 50 MG tablet Take 1 tablet (50 mg total) by mouth daily. 90 tablet 1   metFORMIN (GLUCOPHAGE) 500 MG tablet Take 1 tablet (500 mg total) by mouth in the morning. 90 tablet 1   naproxen (NAPROSYN) 375 MG tablet Take 1 tablet (375 mg total) by mouth 2 (two) times daily. 20 tablet 0   No current facility-administered medications for this visit.    REVIEW OF SYSTEMS:   Constitutional: ( - ) fevers, ( - )  chills , ( - ) night sweats Eyes: ( - ) blurriness of vision, ( - ) double vision, ( - ) watery eyes Ears, nose, mouth, throat, and face: ( - ) mucositis, ( - ) sore throat Respiratory: ( - ) cough, ( - ) dyspnea, ( - ) wheezes Cardiovascular: ( - ) palpitation, ( - ) chest discomfort, ( - ) lower extremity swelling Gastrointestinal:  ( - ) nausea, ( - ) heartburn, ( - ) change in bowel habits Skin: ( - ) abnormal skin rashes Lymphatics: ( - ) new lymphadenopathy, ( - ) easy bruising Neurological: ( - ) numbness, ( - ) tingling, ( - ) new weaknesses Behavioral/Psych: ( - ) mood change, ( - ) new changes  All other systems were  reviewed with the patient and are negative.  PHYSICAL EXAMINATION:  Vitals:   02/09/2022 0921  BP: (!) 129/90  Pulse: (!) 128  Resp: 18  Temp: 97.7 F (36.5 C)  SpO2: 91%   Filed Weights   01/29/2022 0921  Weight: (!) 301 lb 6.4 oz (136.7 kg)    GENERAL: well appearing young obese African-American female in NAD  SKIN: skin color, texture, turgor are normal, no rashes or significant lesions EYES: conjunctiva are pink and non-injected, sclera clear LUNGS: clear to auscultation and percussion with normal breathing effort HEART: regular rate & rhythm and no murmurs  and no lower extremity edema Musculoskeletal: no cyanosis of digits and no clubbing  PSYCH: alert & oriented x 3, fluent speech NEURO: no focal motor/sensory deficits  LABORATORY DATA:  I have reviewed the data as listed    Latest Ref Rng & Units 12/25/2021    9:29 AM 01/09/2021   11:05 AM 09/01/2020   10:05 AM  CBC  WBC 4.0 - 10.5 K/uL 5.9  4.8  5.8   Hemoglobin 12.0 - 15.0 g/dL 12.1  12.4  12.8   Hematocrit 36.0 - 46.0 % 37.4  39.3  38.6   Platelets 150.0 - 400.0 K/uL 366.0  351.0  352.0        Latest Ref Rng & Units 12/25/2021    9:29 AM 09/21/2021    9:03 AM 01/09/2021   11:05 AM  CMP  Glucose 70 - 99 mg/dL 100  88  98   BUN 6 - 23 mg/dL '8  8  6   '$ Creatinine 0.40 - 1.20 mg/dL 1.09  1.10  0.96   Sodium 135 - 145 mEq/L 136  137  138   Potassium 3.5 - 5.1 mEq/L 4.1  4.1  4.2   Chloride 96 - 112 mEq/L 102  104  105   CO2 19 - 32 mEq/L '25  25  26   '$ Calcium 8.4 - 10.5 mg/dL 9.2  9.3  9.1   Total Protein 6.0 - 8.3 g/dL 7.7  7.7  7.2   Total Bilirubin 0.2 - 1.2 mg/dL 1.0  1.3  0.8   Alkaline Phos 39 - 117 U/L 115  118  120   AST 0 - 37 U/L '14  13  14   '$ ALT 0 - 35 U/L '8  9  10      '$ ASSESSMENT & PLAN Shannon [name] 31 y.o. female with medical history significant for depression, HTN, obesity, and prior provoked pulmonary embolism in the setting of birth control use who presents for evaluation of iron deficiency  without anemia.  After review of the labs, review of the records, and discussion with the patient the patients findings are most consistent with iron deficiency without anemia secondary to GYN bleeding who presents for evaluation, however was found to have signs and symptoms concerning for a recurrent pulmonary embolism and was transported directly to the emergency department after our clinic visit.  # Provoked pulmonary embolism -- Is currently having signs and symptoms concerning for recurrent VTE.  She is having shortness of breath, but no chest pain. -- Transported patient to the emergency department directly after our clinic visit. -- Will follow-up with patient after discharge from the hospital.  #Iron Deficiency without Anemia 2/2 to GYN Bleeding -- Findings are consistent with iron deficiency anemia secondary to patient's menorrhagia --Encouraged her to follow-up with OB/GYN for better control of her menstrual cycles --We will confirm iron deficiency anemia by ordering iron panel and ferritin as well as reticulocytes, CBC, and CMP --Continue ferrous sulfate 325 mg daily with a source of vitamin C --Plan for return to clinic in 3 months to re-evaluate.    No orders of the defined types were placed in this encounter.   All questions were answered. The patient knows to call the clinic with any problems, questions or concerns.  A total of more than 60 minutes were spent on this encounter with face-to-face time and non-face-to-face time, including preparing to see the patient, ordering tests and/or medications, counseling the patient and coordination of care as outlined above.  Ledell Peoples, MD Department of Hematology/Oncology [facility] Phone: [phone] Pager: [phone] Email: [email or web address]  01/27/2022 9:57 AM
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
## 2022-02-18 NOTE — Progress Notes (Signed)
ANTICOAGULATION CONSULT NOTE - Initial Consult  Pharmacy Consult for IV heparin Indication: pulmonary embolus  Allergies  Allergen Reactions   Bupropion Other (See Comments)    Reaction:  Constipation, states just the extended release    Patient Measurements: Height: '5\' 9"'$  (175.3 cm) Weight: (!) 136.5 kg (301 lb) IBW/kg (Calculated) : 66.2 Heparin Dosing Weight: 98.9 kg  Vital Signs: Temp: 97.6 F (36.4 C) (11/27 1021) Temp Source: Oral (11/27 1021) BP: 132/108 (11/27 1343) Pulse Rate: 118 (11/27 1343)  Labs: Recent Labs    02/17/2022 1028  HGB 12.9  HCT 42.2  PLT 322  CREATININE 1.15*  TROPONINIHS 174*    Estimated Creatinine Clearance: 104.6 mL/min (A) (by C-G formula based on SCr of 1.15 mg/dL (H)).   Medical History: Past Medical History:  Diagnosis Date   Anemia    Anxiety    Constipation    Depression    Dyspnea    HTN (hypertension)    Knee pain    Leg edema    Medical history non-contributory    Obesity    Pain in both feet    Prolonged QT interval    Pulmonary thromboembolism ([facility])    Suicide ideation     Medications:  Patient has history of PE but no longer on anticoagulation   Assessment: Pharmacy consulted to dose IV heparin for this 31 yo F patient with history of PE and who presented today with a chief complaint of shortness of breath.  Today's CT CT Angio Chest shows massive to submassive PE.  Goal of Therapy:  Heparin level 0.3-0.7 units/ml Monitor platelets by anticoagulation protocol: Yes   Plan:  Obtain baseline aPTT and PT/INR Heparin 7000 units IV bolus via infusion then 1600 units/hr IV continuous infusion 6 hour heparin level Monitor daily heparin level, CBC, signs/symptoms of bleeding   Thank you for allowing pharmacy to be a part of this patient's care.  Royetta Asal, PharmD, BCPS Clinical Pharmacist [place] Please utilize Amion for appropriate phone number to reach the unit pharmacist ([place]) 02/04/2022 2:14 PM

## 2022-02-18 NOTE — Progress Notes (Signed)
ANTICOAGULATION CONSULT NOTE - follow up  Pharmacy Consult for IV heparin Indication: pulmonary embolus  Allergies  Allergen Reactions   Bupropion Other (See Comments)    Constipation, states just the extended release    Patient Measurements: Height: '5\' 9"'$  (175.3 cm) Weight: (!) 136.5 kg (301 lb) IBW/kg (Calculated) : 66.2 Heparin Dosing Weight: 98.9 kg  Vital Signs: Temp: 97.7 F (36.5 C) (11/27 2104) Temp Source: Oral (11/27 2104) BP: 44/32 (11/27 1930) Pulse Rate: 102 (11/27 2115)  Labs: Recent Labs    02/21/2022 1028 02/11/2022 1346 02/16/2022 1414 02/13/2022 2120  HGB 12.9  --   --   --   HCT 42.2  --   --   --   PLT 322  --   --   --   APTT  --  31  --   --   LABPROT  --  14.1  --   --   INR  --  1.1  --   --   HEPARINUNFRC  --   --   --  0.75*  CREATININE 1.15*  --   --   --   TROPONINIHS 174*  --  153*  --      Estimated Creatinine Clearance: 104.6 mL/min (A) (by C-G formula based on SCr of 1.15 mg/dL (H)).   Medical History: Past Medical History:  Diagnosis Date   Anemia    Anxiety    Constipation    Depression    Dyspnea    HTN (hypertension)    Knee pain    Leg edema    Medical history non-contributory    Obesity    Pain in both feet    Prolonged QT interval    Pulmonary thromboembolism ([facility])    Suicide ideation     Medications:  Patient has history of PE but no longer on anticoagulation   Assessment: Pharmacy consulted to dose IV heparin for this 31 yo F patient with history of PE and who presented today with a chief complaint of shortness of breath.  Today's CT CT Angio Chest shows massive to submassive PE.  1st HL 0.75 supra-therapeutic on 1600 units/hr Per RN no bleeding noted  Goal of Therapy:  Heparin level 0.3-0.7 units/ml Monitor platelets by anticoagulation protocol: Yes   Plan:  Decrease heparin drip to 1500 units/hr 6 hour heparin level Monitor daily heparin level, CBC, signs/symptoms of bleeding   Thank you for  allowing pharmacy to be a part of this patient's care.  Dolly Rias RPh 02/11/2022, 10:13 PM
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
## 2022-02-19 ENCOUNTER — Ambulatory Visit: Payer: Federal, State, Local not specified - PPO | Admitting: Family

## 2022-02-19 ENCOUNTER — Inpatient Hospital Stay (HOSPITAL_COMMUNITY): Payer: Federal, State, Local not specified - PPO

## 2022-02-19 ENCOUNTER — Other Ambulatory Visit (HOSPITAL_COMMUNITY): Payer: Self-pay

## 2022-02-19 DIAGNOSIS — I2699 Other pulmonary embolism without acute cor pulmonale: Secondary | ICD-10-CM

## 2022-02-19 DIAGNOSIS — I82409 Acute embolism and thrombosis of unspecified deep veins of unspecified lower extremity: Secondary | ICD-10-CM | POA: Diagnosis not present

## 2022-02-19 DIAGNOSIS — R0602 Shortness of breath: Secondary | ICD-10-CM

## 2022-02-19 DIAGNOSIS — I2609 Other pulmonary embolism with acute cor pulmonale: Secondary | ICD-10-CM

## 2022-02-19 DIAGNOSIS — I82492 Acute embolism and thrombosis of other specified deep vein of left lower extremity: Secondary | ICD-10-CM | POA: Diagnosis not present

## 2022-02-19 DIAGNOSIS — I2782 Chronic pulmonary embolism: Secondary | ICD-10-CM | POA: Diagnosis not present

## 2022-02-19 LAB — COMPREHENSIVE METABOLIC PANEL
ALT: 14 U/L (ref 0–44)
AST: 20 U/L (ref 15–41)
Albumin: 3.9 g/dL (ref 3.5–5.0)
Alkaline Phosphatase: 91 U/L (ref 38–126)
Anion gap: 11 (ref 5–15)
BUN: 15 mg/dL (ref 6–20)
CO2: 21 mmol/L — ABNORMAL LOW (ref 22–32)
Calcium: 9.1 mg/dL (ref 8.9–10.3)
Chloride: 107 mmol/L (ref 98–111)
Creatinine, Ser: 0.98 mg/dL (ref 0.44–1.00)
GFR, Estimated: >60 mL/min (ref >60)
Glucose, Bld: 96 mg/dL (ref 70–99)
Potassium: 3.8 mmol/L (ref 3.5–5.1)
Sodium: 139 mmol/L (ref 135–145)
Total Bilirubin: 1.4 mg/dL — ABNORMAL HIGH (ref 0.3–1.2)
Total Protein: 7.7 g/dL (ref 6.5–8.1)

## 2022-02-19 LAB — CBC
HCT: 38.1 % (ref 36.0–46.0)
Hemoglobin: 11.5 g/dL — ABNORMAL LOW (ref 12.0–15.0)
MCH: 24.4 pg — ABNORMAL LOW (ref 26.0–34.0)
MCHC: 30.2 g/dL (ref 30.0–36.0)
MCV: 80.9 fL (ref 80.0–100.0)
Platelets: 269 10*3/uL (ref 150–400)
RBC: 4.71 MIL/uL (ref 3.87–5.11)
RDW: 14.7 % (ref 11.5–15.5)
WBC: 9 10*3/uL (ref 4.0–10.5)
nRBC: 0 % (ref 0.0–0.2)

## 2022-02-19 LAB — HEPARIN LEVEL (UNFRACTIONATED)
Heparin Unfractionated: 0.47 IU/mL (ref 0.30–0.70)
Heparin Unfractionated: 0.38 IU/mL (ref 0.30–0.70)

## 2022-02-19 LAB — HIV ANTIBODY (ROUTINE TESTING W REFLEX): HIV Screen 4th Generation wRfx: NONREACTIVE

## 2022-02-19 LAB — ANTITHROMBIN III: AntiThromb III Func: 103 % (ref 75–120)

## 2022-02-19 LAB — ECHOCARDIOGRAM COMPLETE
Height: 69 in
S' Lateral: 2.3 cm
Weight: 4816 oz

## 2022-02-19 MED ORDER — HYDRALAZINE HCL 20 MG/ML IJ SOLN: 10.0000 mg | INTRAMUSCULAR | Status: DC | PRN

## 2022-02-19 MED ORDER — TRAZODONE HCL 50 MG PO TABS: 50.0000 mg | ORAL_TABLET | Freq: Every evening | ORAL | Status: DC | PRN

## 2022-02-19 MED ORDER — METOPROLOL TARTRATE 5 MG/5ML IV SOLN: 5.0000 mg | INTRAVENOUS | Status: DC | PRN

## 2022-02-19 MED ORDER — GUAIFENESIN 100 MG/5ML PO LIQD: 5.0000 mL | ORAL | Status: DC | PRN

## 2022-02-19 MED ORDER — SENNOSIDES-DOCUSATE SODIUM 8.6-50 MG PO TABS: 1.0000 | ORAL_TABLET | Freq: Every evening | ORAL | Status: DC | PRN

## 2022-02-19 NOTE — Progress Notes (Signed)
ANTICOAGULATION CONSULT NOTE - follow up  Pharmacy Consult for IV heparin Indication: pulmonary embolus  Allergies  Allergen Reactions   Bupropion Other (See Comments)    Constipation, states just the extended release    Patient Measurements: Height: '5\' 9"'$  (175.3 cm) Weight: (!) 136.5 kg (301 lb) IBW/kg (Calculated) : 66.2 Heparin Dosing Weight: 98.9 kg  Vital Signs: Temp: 97.6 F (36.4 C) (11/28 0456) Temp Source: Oral (11/28 0456) BP: 138/97 (11/28 0500) Pulse Rate: 94 (11/28 0500)  Labs: Recent Labs    02/09/2022 1028 02/13/2022 1346 01/31/2022 1414 02/05/2022 2120 02/06/2022 0450  HGB 12.9  --   --   --  11.5*  HCT 42.2  --   --   --  38.1  PLT 322  --   --   --  269  APTT  --  31  --   --   --   LABPROT  --  14.1  --   --   --   INR  --  1.1  --   --   --   HEPARINUNFRC  --   --   --  0.75* 0.47  CREATININE 1.15*  --   --   --  0.98  TROPONINIHS 174*  --  153*  --   --      Estimated Creatinine Clearance: 122.7 mL/min (by C-G formula based on SCr of 0.98 mg/dL).   Medical History: Past Medical History:  Diagnosis Date   Anemia    Anxiety    Constipation    Depression    Dyspnea    HTN (hypertension)    Knee pain    Leg edema    Medical history non-contributory    Obesity    Pain in both feet    Prolonged QT interval    Pulmonary thromboembolism (HCC)    Suicide ideation     Medications:  Patient has history of PE but no longer on anticoagulation   Assessment: Pharmacy consulted to dose IV heparin for this 31 yo F patient with history of PE and who presented today with a chief complaint of shortness of breath.  Today's CT CT Angio Chest shows massive to submassive PE.  02/16/2022 HL 0.47 therapeutic on 1500 units/hr Hgb 11.5, Plts 269 No bleeding reported  Goal of Therapy:  Heparin level 0.3-0.7 units/ml Monitor platelets by anticoagulation protocol: Yes   Plan:  continue heparin drip at 1500 units/hr Confirmatory heparin level in 6  hours Monitor daily heparin level, CBC, signs/symptoms of bleeding   Thank you for allowing pharmacy to be a part of this patient's care.  Dolly Rias RPh 02/17/2022, 5:29 AM

## 2022-02-19 NOTE — Progress Notes (Signed)
Mobility Specialist - Progress Note  Pre-mobility: 103 bpm HR During mobility: 140 bpm HR, 84% SpO2 Post-mobility: 110 bpm HR, 94% SPO2   01/23/2022 1432  Mobility  Activity Ambulated independently in hallway  Level of Assistance Standby assist, set-up cues, supervision of patient - no hands on  Assistive Device None  Distance Ambulated (ft) 150 ft  Range of Motion/Exercises Active  Activity Response Tolerated well  Mobility Referral Yes  $Mobility charge 1 Mobility   Pt was found in bed and agreeable to ambulate. During ambulation stated that she had not been up and walking much and her feet needed to get used to walking. Once back in room returned to recliner chair and practiced pursed lip breathing. At EOS was left with all necessities in reach and RN notified.  Ferd Hibbs Mobility Specialist

## 2022-02-19 NOTE — Progress Notes (Signed)
  Echocardiogram 2D Echocardiogram has been performed.  Shannon [name] 02/21/2022, 9:08 AM

## 2022-02-19 NOTE — Progress Notes (Signed)
BLE venous duplex has been completed.   Results can be found under chart review under CV PROC. 02/06/2022 10:24 AM Arelie Kuzel RVT, RDMS

## 2022-02-19 NOTE — Consult Note (Signed)
Hospital Consult    Reason for Consult: Left lower extremity extensive DVT Referring Physician: Dr. Reesa Chew MRN #:  [id number]  History of Present Illness: This is a 31 y.o. female history of previous DVT treated with anticoagulation thought secondary to oral contraceptives which were subsequently stopped.  She is now readmitted with recurrent left lower extremity DVT several years after her last 1.  She is not on any oral contraceptives or any other hormonal therapy at this time and is a lifelong non-smoker.  She does not have any family history of known hypercoagulable state.  She also has PE but not short of breath at this time.  Left leg remains uncomfortable but not any frank pain.  Currently patient is on heparin drip.  Past Medical History:  Diagnosis Date   Anemia    Anxiety    Constipation    Depression    Dyspnea    HTN (hypertension)    Knee pain    Leg edema    Medical history non-contributory    Obesity    Pain in both feet    Prolonged QT interval    Pulmonary thromboembolism ([place])    Suicide ideation     Past Surgical History:  Procedure Laterality Date   NO PAST SURGERIES     TEETH REMOVAL  07/2017    Allergies  Allergen Reactions   Bupropion Other (See Comments)    Constipation, states just the extended release    Prior to Admission medications   Medication Sig Start Date End Date Taking? Authorizing Provider  cyclobenzaprine (FLEXERIL) 10 MG tablet Take 1 tablet (10 mg total) by mouth 2 (two) times daily as needed for muscle spasms. 02/13/22  Yes Dorie Rank, MD  losartan (COZAAR) 50 MG tablet Take 1 tablet (50 mg total) by mouth daily. 12/25/21  Yes Marrian Salvage, FNP  metFORMIN (GLUCOPHAGE) 500 MG tablet Take 1 tablet (500 mg total) by mouth in the morning. 09/21/21  Yes Marrian Salvage, FNP  Multiple Vitamin (MULTIVITAMIN) tablet Take 1 tablet by mouth daily with breakfast.   Yes [provider]  naproxen (NAPROSYN) 375 MG tablet  Take 1 tablet (375 mg total) by mouth 2 (two) times daily. Patient taking differently: Take 375 mg by mouth 2 (two) times daily as needed (for pain). 02/13/22  Yes Dorie Rank, MD  hydrochlorothiazide (HYDRODIURIL) 12.5 MG tablet TAKE 1 TO 2 TABLETS BY MOUTH DAILY AS NEEDED FOR SWELLING Patient not taking: Reported on 01/26/2022 09/21/21   Marrian Salvage, FNP    Social History   Socioeconomic History   Marital status: Single    Spouse name: Not on file   Number of children: 0   Years of education: 16   Highest education level: Not on file  Occupational History   Occupation: Gaffer  Tobacco Use   Smoking status: Never   Smokeless tobacco: Never  Vaping Use   Vaping Use: Never used  Substance and Sexual Activity   Alcohol use: No   Drug use: No   Sexual activity: Never    Birth control/protection: None, Abstinence    Comment: VIRGIN  Other Topics Concern   Not on file  Social History Narrative   Fun/Hobby: Going to the gym, writing.   Social Determinants of Health   Financial Resource Strain: Not on file  Food Insecurity: No Food Insecurity (02/04/2022)   Hunger Vital Sign    Worried About Running Out of Food in the Last Year:  Never true    Ran Out of Food in the Last Year: Never true  Transportation Needs: No Transportation Needs (02/10/2022)   PRAPARE - Hydrologist (Medical): No    Lack of Transportation (Non-Medical): No  Physical Activity: Not on file  Stress: Not on file  Social Connections: Not on file  Intimate Partner Violence: Not At Risk (02/13/2022)   Humiliation, Afraid, Rape, and Kick questionnaire    Fear of Current or Ex-Partner: No    Emotionally Abused: No    Physically Abused: No    Sexually Abused: No     Family History  Problem Relation Age of Onset   Healthy Mother    Hypertension Mother    Obesity Mother    Healthy Father    Pulmonary embolism Neg Hx    Deep vein  thrombosis Neg Hx     Review of Systems  Constitutional: Negative.   HENT: Negative.    Eyes: Negative.   Respiratory:  Positive for shortness of breath.   Cardiovascular: Negative.   Gastrointestinal: Negative.   Musculoskeletal:        Left leg swelling  Skin: Negative.   Neurological: Negative.   Endo/Heme/Allergies: Negative.   Psychiatric/Behavioral: Negative.         Physical Examination  Vitals:   02/03/2022 1321 01/28/2022 1643  BP: (!) 151/89 (!) 140/66  Pulse: (!) 108 (!) 101  Resp: 18 18  Temp: 98.7 F (37.1 C) 97.8 F (36.6 C)  SpO2: 95% 96%   Body mass index is 44.45 kg/m.  Physical Exam Constitutional:      Appearance: She is obese.  HENT:     Head: Normocephalic.  Eyes:     Pupils: Pupils are equal, round, and reactive to light.  Cardiovascular:     Rate and Rhythm: Normal rate.  Pulmonary:     Effort: Pulmonary effort is normal.  Abdominal:     Palpations: Abdomen is soft.  Musculoskeletal:     Right lower leg: No edema.     Left lower leg: Edema present.  Skin:    General: Skin is warm and dry.     Capillary Refill: Capillary refill takes less than 2 seconds.  Neurological:     General: No focal deficit present.     Mental Status: She is alert.      CBC    Component Value Date/Time   WBC 9.0 01/27/2022 0450   RBC 4.71 02/11/2022 0450   HGB 11.5 (L) 01/28/2022 0450   HGB 13.1 03/08/2019 1654   HCT 38.1 01/27/2022 0450   HCT 40.2 03/08/2019 1654   PLT 269 02/11/2022 0450   PLT 339 03/08/2019 1654   MCV 80.9 01/26/2022 0450   MCV 84 03/08/2019 1654   MCH 24.4 (L) 01/29/2022 0450   MCHC 30.2 02/08/2022 0450   RDW 14.7 02/18/2022 0450   RDW 12.7 03/08/2019 1654   LYMPHSABS 3.1 02/15/2022 1028   LYMPHSABS 2.3 03/08/2019 1654   MONOABS 0.5 01/28/2022 1028   EOSABS 0.1 02/14/2022 1028   EOSABS 0.1 03/08/2019 1654   BASOSABS 0.0 01/24/2022 1028   BASOSABS 0.1 03/08/2019 1654    BMET    Component Value Date/Time   NA 139  01/28/2022 0450   NA 141 03/08/2019 1654   K 3.8 02/09/2022 0450   CL 107 02/17/2022 0450   CO2 21 (L) 02/02/2022 0450   GLUCOSE 96 01/24/2022 0450   BUN 15 02/08/2022 0450   BUN 8  03/08/2019 1654   CREATININE 0.98 01/28/2022 0450   CALCIUM 9.1 02/03/2022 0450   GFRNONAA >60 02/10/2022 0450   GFRAA 78 03/08/2019 1654    COAGS: Lab Results  Component Value Date   INR 1.1 02/01/2022     Non-Invasive Vascular Imaging:   IVC/Iliac Findings:  +----------+------+--------+--------+    IVC    PatentThrombusComments  +----------+------+--------+--------+  IVC Prox  patent                  +----------+------+--------+--------+  IVC Mid   patent                  +----------+------+--------+--------+  IVC Distalpatent                  +----------+------+--------+--------+     +-------------------+---------+-----------+---------+-----------+----------  ----+         CIV        RT-PatentRT-ThrombusLT-PatentLT-Thrombus    Comments     +-------------------+---------+-----------+---------+-----------+----------  ----+  Common Iliac Prox                                          Not  visualized  +-------------------+---------+-----------+---------+-----------+----------  ----+  Common Iliac Mid                                           Not  Visualized  +-------------------+---------+-----------+---------+-----------+----------  ----+  Common Iliac Distal                                        Not  visualized  +-------------------+---------+-----------+---------+-----------+----------  ----+   Unable to visualized CIV on this exam.   +-------------------------+---------+-----------+---------+-----------+----  ----+            EIV            RT-PatentRT-ThrombusLT-PatentLT-ThrombusComments  +-------------------------+---------+-----------+---------+-----------+----  ----+  External Iliac Vein Prox                       patent                        +-------------------------+---------+-----------+---------+-----------+----  ----+  External Iliac Vein Mid                       patent                        +-------------------------+---------+-----------+---------+-----------+----  ----+  External Iliac Vein                           patent                        Distal                                                                      +-------------------------+---------+-----------+---------+-----------+----  ----+  Summary:  IVC/Iliac: There is no evidence of thrombus involving the IVC. There is no  evidence of thrombus involving the left external iliac vein. Left common  iliac vein not visualized on this exam.   ASSESSMENT/PLAN: This is a 31 y.o. female with history of recurrent left lower extremity DVT which is quite extensive although not involving the common femoral vein without any history of hypercoagulable state now looking at lifelong anticoagulation for her second episode of essentially unprovoked DVT although the first was on oral contraception.  Given that this is a left-sided DVT there is some concern for May Thurner syndrome and I attempted to obtain IVC iliac duplex but this could not visualize the left common iliac vein either because of thrombosis or because of patient's habitus.  For this reason I will obtain CT venogram although may not give [place] information if there is an obstructive process could avoid possible lifelong anticoagulation and treat her underlying swelling.  Continue heparin drip for now and will follow-up with primary team after CT scan.  Hendrix Console C. Donzetta Matters, MD [facility] Office: [phone] Pager: [phone]
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
## 2022-02-19 NOTE — Progress Notes (Signed)
ANTICOAGULATION CONSULT NOTE - follow up  Pharmacy Consult for IV heparin Indication: pulmonary embolus  Allergies  Allergen Reactions   Bupropion Other (See Comments)    Constipation, states just the extended release    Patient Measurements: Height: '5\' 9"'$  (175.3 cm) Weight: (!) 136.5 kg (301 lb) IBW/kg (Calculated) : 66.2 Heparin Dosing Weight: 99 kg  Vital Signs: Temp: 97.6 F (36.4 C) (11/28 0456) Temp Source: Oral (11/28 0456) BP: 127/101 (11/28 0700) Pulse Rate: 102 (11/28 0700)  Labs: Recent Labs    02/13/2022 1028 02/10/2022 1346 02/12/2022 1414 02/16/2022 2120 02/07/2022 0450  HGB 12.9  --   --   --  11.5*  HCT 42.2  --   --   --  38.1  PLT 322  --   --   --  269  APTT  --  31  --   --   --   LABPROT  --  14.1  --   --   --   INR  --  1.1  --   --   --   HEPARINUNFRC  --   --   --  0.75* 0.47  CREATININE 1.15*  --   --   --  0.98  TROPONINIHS 174*  --  153*  --   --      Estimated Creatinine Clearance: 122.7 mL/min (by C-G formula based on SCr of 0.98 mg/dL).   Medical History: Past Medical History:  Diagnosis Date   Anemia    Anxiety    Constipation    Depression    Dyspnea    HTN (hypertension)    Knee pain    Leg edema    Medical history non-contributory    Obesity    Pain in both feet    Prolonged QT interval    Pulmonary thromboembolism (HCC)    Suicide ideation     Medications:  Patient has history of PE in 2018, but is no longer on anticoagulation   Assessment: Pharmacy consulted to dose IV heparin for this 31 yo F patient with history of PE and who presented today with a chief complaint of shortness of breath. CT Angio Chest shows massive to submassive PE with RHS.  Today, 02/07/2022 Confirmatory HL = 0.38 remains therapeutic on heparin infusion of 1500 units/hr CBC: Hgb slightly low/decreased, Plt WNL SCr WNL Confirmed with RN that heparin infusing at correct rate. No line issues. No bleeding.   Goal of Therapy:  Heparin level  0.3-0.7 units/ml Monitor platelets by anticoagulation protocol: Yes   Plan:  Continue heparin infusion at 1500 units/hr CBC, HL daily Monitor for signs of bleeding  [name] Noon, PharmD 02/21/2022 3:07 PM

## 2022-02-19 NOTE — Progress Notes (Signed)
IVC/Iliac (left) duplex has been completed.     Results can be found under chart review under CV PROC. 02/17/2022 4:05 PM Jamee Pacholski RVT, RDMS

## 2022-02-19 NOTE — TOC Benefit Eligibility Note (Signed)
Patient Teacher, English as a foreign language completed.    The patient is currently admitted and upon discharge could be taking Eliquis 5 mg.  The current 30 day co-pay is $60.00.   The patient is currently admitted and upon discharge could be taking Xarelto 20 mg.  The current 30 day co-pay is $60.00.   The patient is insured through [place], [place] Patient [place] Patient Advocate Team Direct Number: [phone]  Fax: [phone]

## 2022-02-20 ENCOUNTER — Inpatient Hospital Stay (HOSPITAL_COMMUNITY): Payer: Federal, State, Local not specified - PPO

## 2022-02-20 DIAGNOSIS — J9811 Atelectasis: Secondary | ICD-10-CM | POA: Diagnosis not present

## 2022-02-20 DIAGNOSIS — I469 Cardiac arrest, cause unspecified: Secondary | ICD-10-CM | POA: Diagnosis not present

## 2022-02-20 DIAGNOSIS — K8020 Calculus of gallbladder without cholecystitis without obstruction: Secondary | ICD-10-CM | POA: Diagnosis not present

## 2022-02-20 DIAGNOSIS — D259 Leiomyoma of uterus, unspecified: Secondary | ICD-10-CM | POA: Diagnosis not present

## 2022-02-20 LAB — BETA-2-GLYCOPROTEIN I ABS, IGG/M/A
Beta-2 Glyco I IgG: <9 GPI IgG units (ref 0–20)
Beta-2-Glycoprotein I IgA: <9 GPI IgA units (ref 0–25)
Beta-2-Glycoprotein I IgM: <9 GPI IgM units (ref 0–32)

## 2022-02-20 LAB — CARDIOLIPIN ANTIBODIES, IGG, IGM, IGA
Anticardiolipin IgA: <9 APL U/mL (ref 0–11)
Anticardiolipin IgG: <9 GPL U/mL (ref 0–14)
Anticardiolipin IgM: <9 MPL U/mL (ref 0–12)

## 2022-02-20 LAB — HOMOCYSTEINE: Homocysteine: 19 umol/L — ABNORMAL HIGH (ref 0.0–14.5)

## 2022-02-20 LAB — PROTEIN C, TOTAL: Protein C, Total: 111 % (ref 60–150)

## 2022-02-20 LAB — GLUCOSE, CAPILLARY: Glucose-Capillary: 108 mg/dL — ABNORMAL HIGH (ref 70–99)

## 2022-02-20 MED ORDER — METHYLPREDNISOLONE SODIUM SUCC 125 MG IJ SOLR: 125.0000 mg | INTRAMUSCULAR | Status: DC

## 2022-02-20 MED ORDER — IOHEXOL 350 MG/ML SOLN
125.0000 mL | Freq: Once | INTRAVENOUS | Status: AC | PRN
  Administered 2022-02-20: 125 mL via INTRAVENOUS

## 2022-02-20 MED ORDER — SODIUM CHLORIDE 0.9 % IV SOLN: INTRAVENOUS | Status: DC

## 2022-02-20 MED ORDER — METHYLPREDNISOLONE SODIUM SUCC 125 MG IJ SOLR
INTRAMUSCULAR | Status: AC
  Filled 2022-02-20: qty 2

## 2022-02-20 MED ORDER — DIPHENHYDRAMINE HCL 50 MG/ML IJ SOLN: 50.0000 mg | INTRAMUSCULAR | Status: DC

## 2022-02-20 MED ORDER — SODIUM CHLORIDE (PF) 0.9 % IJ SOLN
INTRAMUSCULAR | Status: AC
  Filled 2022-02-20: qty 50

## 2022-02-20 MED ORDER — DIPHENHYDRAMINE HCL 50 MG/ML IJ SOLN
INTRAMUSCULAR | Status: AC
  Filled 2022-02-20: qty 1

## 2022-02-20 MED ORDER — FENTANYL CITRATE (PF) 100 MCG/2ML IJ SOLN
INTRAMUSCULAR | Status: AC
  Filled 2022-02-20: qty 2

## 2022-02-20 MED ORDER — TENECTEPLASE 50 MG IV KIT
50.0000 mg | PACK | INTRAVENOUS | Status: DC
  Filled 2022-02-20: qty 10

## 2022-02-20 MED ORDER — LORAZEPAM 2 MG/ML IJ SOLN: 1.0000 mg | INTRAMUSCULAR | Status: DC

## 2022-02-20 MED ORDER — FAMOTIDINE IN NACL 20-0.9 MG/50ML-% IV SOLN
20.0000 mg | INTRAVENOUS | Status: DC
  Filled 2022-02-20: qty 50

## 2022-02-20 MED FILL — Medication: Qty: 1 | Status: AC

## 2022-02-21 LAB — PTT-LA MIX: PTT-LA Mix: 42.3 s — ABNORMAL HIGH (ref 0.0–40.5)

## 2022-02-21 LAB — DRVVT MIX: dRVVT Mix: 42.7 s — ABNORMAL HIGH (ref 0.0–40.4)

## 2022-02-21 LAB — DRVVT CONFIRM: dRVVT Confirm: 1.1 ratio (ref 0.8–1.2)

## 2022-02-21 LAB — LUPUS ANTICOAGULANT PANEL
DRVVT: 47.2 s — ABNORMAL HIGH (ref 0.0–47.0)
PTT Lupus Anticoagulant: 43.6 s — ABNORMAL HIGH (ref 0.0–43.5)

## 2022-02-21 LAB — PROTEIN S, TOTAL: Protein S Ag, Total: 103 % (ref 60–150)

## 2022-02-21 LAB — PROTEIN S ACTIVITY: Protein S Activity: 97 % (ref 63–140)

## 2022-02-21 LAB — HEXAGONAL PHASE PHOSPHOLIPID: Hexagonal Phase Phospholipid: 6 s (ref 0–11)

## 2022-02-21 LAB — PROTEIN C ACTIVITY: Protein C Activity: 133 % (ref 73–180)
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
## 2022-02-22 NOTE — TOC Initial Note (Signed)
Transition of Care [facility]) - Initial/Assessment Note    Patient Details  Name: Shannon [name] MRN: [id number] Date of Birth: 1990-08-04  Transition of Care [facility]) CM/SW Contact:    Leeroy Cha, RN Phone Number: 03/04/22, 9:26 AM  Clinical Narrative:                  Transition of Care [facility]) Screening Note   Patient Details  Name: Shannon [name] Date of Birth: 03-Feb-1991   Transition of Care [facility]) CM/SW Contact:    Leeroy Cha, RN Phone Number: 03-04-22, 9:27 AM    Transition of Care Department [facility]) has reviewed patient and no TOC needs have been identified at this time. We will continue to monitor patient advancement through interdisciplinary progression rounds. If new patient transition needs arise, please place a TOC consult.    Expected Discharge Plan: Home/Self Care Barriers to Discharge: Continued Medical Work up   Patient Goals and CMS Choice Patient states their goals for this hospitalization and ongoing recovery are:: to go home and be healthy CMS Medicare.gov Compare Post Acute Care list provided to:: Patient    Expected Discharge Plan and Services Expected Discharge Plan: Home/Self Care   Discharge Planning Services: CM Consult   Living arrangements for the past 2 months: Apartment                                      Prior Living Arrangements/Services Living arrangements for the past 2 months: Apartment Lives with:: Self Patient language and need for interpreter reviewed:: Yes Do you feel safe going back to the place where you live?: Yes            Criminal Activity/Legal Involvement Pertinent to Current Situation/Hospitalization: No - Comment as needed  Activities of Daily Living Home Assistive Devices/Equipment: None ADL Screening (condition at time of admission) Patient's cognitive ability adequate to safely complete daily activities?: Yes Is the patient deaf or have difficulty hearing?: No Does the patient have  difficulty seeing, even when wearing glasses/contacts?: No Does the patient have difficulty concentrating, remembering, or making decisions?: No Patient able to express need for assistance with ADLs?: Yes Does the patient have difficulty dressing or bathing?: No Independently performs ADLs?: Yes (appropriate for developmental age) Does the patient have difficulty walking or climbing stairs?: No Weakness of Legs: None Weakness of Arms/Hands: None  Permission Sought/Granted                  Emotional Assessment Appearance:: Appears stated age Attitude/Demeanor/Rapport: Engaged Affect (typically observed): Calm Orientation: : Oriented to Self, Oriented to Place, Oriented to  Time, Oriented to Situation Alcohol / Substance Use: Never Used Psych Involvement: No (comment)  Admission diagnosis:  Acute pulmonary embolism ([place]) [I26.99] Other acute pulmonary embolism, unspecified whether acute cor pulmonale present ([place]) [I26.99] Patient Active Problem List   Diagnosis Date Noted   Acute pulmonary embolism ([place]) 01/30/2022   Prediabetes 06/14/2019   Other hyperlipidemia 04/22/2018   Insulin resistance 04/02/2018   Shortness of breath on exertion 12/29/2017   Essential hypertension 12/29/2017   Vitamin D deficiency 12/29/2017   Obesity, Class III, BMI 40-49.9 (morbid obesity) ([place]) 08/11/2017   Routine general medical examination at a health care facility 08/10/2017   Hypertension 12/30/2016   Slurred speech 12/30/2016   Iron deficiency anemia 09/24/2016   Fatigue 08/06/2016   Pulmonary embolism with acute cor pulmonale ([place]) 07/04/2016  Hypokalemia 07/04/2016   Prolonged QT interval 07/04/2016   Depression with anxiety 07/04/2016   Adjustment disorder with emotional disturbance 03/31/2016   Major depressive disorder, recurrent severe without psychotic features ([place]) 01/10/2016   Major depressive disorder, recurrent episode, mild ([place]) 01/01/2016   Suicide ideation 02/16/2013    PCP:  Marrian Salvage, [place] Pharmacy:   [facility] [place] [place] [address] [place] [address] Phone: [phone] Fax: [phone]     Social Determinants of Health (SDOH) Interventions    Readmission Risk Interventions   No data to display

## 2022-02-22 NOTE — Progress Notes (Signed)
Called to assist in code. Despite 30+ minutes compressions (including 20+ after TNK) patient could not maintain ROSC.  Capnography and [place] confirmed PEA arrest.  No obvious pericardial effusion on very limited bedside US.  Cause of death suspected to be PE.  Remainder of code note per EDP.  Erskine Emery MD PCCM

## 2022-02-22 NOTE — Progress Notes (Signed)
Department of Emergency Medicine CODE BLUE CONSULTATION    CONSULT NOTE  Chief Complaint: AMS, increased WOB following CT  Level 5 caveat, AMS/agitated  History of present illness: I was contacted by the hospital for a [place] at [place] and presented to the patient's bedside.  She was admitted yesterday 2/2 submassive PE. Started on heparin. Just completed CT abdomen/pelvis as part of her workup. Per nursing had recent contrasted study w/o complication.   ROS: Unable to obtain, Level V caveat  Scheduled Meds:  diphenhydrAMINE       diphenhydrAMINE  50 mg Intravenous STAT   LORazepam  1 mg Intravenous STAT   losartan  50 mg Oral Daily   methylPREDNISolone (SOLU-MEDROL) injection  125 mg Intravenous STAT   methylPREDNISolone sodium succinate       multivitamin with minerals  1 tablet Oral Daily   sodium chloride (PF)       tenecteplase  50 mg Intravenous STAT   Continuous Infusions:  sodium chloride     famotidine (PEPCID) IV     heparin 1,500 Units/hr (01/29/2022 1823)   PRN Meds:.acetaminophen **OR** acetaminophen, diphenhydrAMINE, guaiFENesin, hydrALAZINE, methylPREDNISolone sodium succinate, metoprolol tartrate, oxyCODONE, senna-docusate, sodium chloride (PF), traZODone Past Medical History:  Diagnosis Date   Anemia    Anxiety    Constipation    Depression    Dyspnea    HTN (hypertension)    Knee pain    Leg edema    Medical history non-contributory    Obesity    Pain in both feet    Prolonged QT interval    Pulmonary thromboembolism ([place])    Suicide ideation    Past Surgical History:  Procedure Laterality Date   NO PAST SURGERIES     TEETH REMOVAL  07/2017   Social History   Socioeconomic History   Marital status: Single    Spouse name: Not on file   Number of children: 0   Years of education: 16   Highest education level: Not on file  Occupational History   Occupation: Gaffer  Tobacco Use   Smoking status:  Never   Smokeless tobacco: Never  Vaping Use   Vaping Use: Never used  Substance and Sexual Activity   Alcohol use: No   Drug use: No   Sexual activity: Never    Birth control/protection: None, Abstinence    Comment: VIRGIN  Other Topics Concern   Not on file  Social History Narrative   Fun/Hobby: Going to the gym, writing.   Social Determinants of Health   Financial Resource Strain: Not on file  Food Insecurity: No Food Insecurity (02/13/2022)   Hunger Vital Sign    Worried About Running Out of Food in the Last Year: Never true    Ran Out of Food in the Last Year: Never true  Transportation Needs: No Transportation Needs (02/21/2022)   PRAPARE - Hydrologist (Medical): No    Lack of Transportation (Non-Medical): No  Physical Activity: Not on file  Stress: Not on file  Social Connections: Not on file  Intimate Partner Violence: Not At Risk (02/07/2022)   Humiliation, Afraid, Rape, and Kick questionnaire    Fear of Current or Ex-Partner: No    Emotionally Abused: No    Physically Abused: No    Sexually Abused: No   Allergies  Allergen Reactions   Bupropion Other (See Comments)    Constipation, states just the extended release  Last set of Vital Signs (not current) Vitals:   02/25/2022 0140 Feb 25, 2022 0531  BP: (!) 120/96 (!) 135/94  Pulse: 100 94  Resp: 20 20  Temp: 98.1 F (36.7 C) 98.4 F (36.9 C)  SpO2: 96% 95%      Physical Exam Gen: agitated, shouting at staff, grabbing at staff Cardiovascular: RRR  Resp: diminished b/l Abd: nondistended  Neuro: GCS 14 2/2 agitation, moving 4 extremities spontaneously  HEENT: no stridor Neck: No crepitus  Musculoskeletal: No deformity  Skin: warm   .Critical Care  Performed by: Jeanell Sparrow, DO Authorized by: Jeanell Sparrow, DO   Critical care provider statement:    Critical care time (minutes):  50   Critical care time was exclusive of:  Separately billable procedures and  treating other patients   Critical care was necessary to treat or prevent imminent or life-threatening deterioration of the following conditions:  Cardiac failure, circulatory failure and respiratory failure   Critical care was time spent personally by me on the following activities:  Development of treatment plan with patient or surrogate, discussions with consultants, evaluation of patient's response to treatment, examination of patient, ordering and review of laboratory studies, ordering and review of radiographic studies, ordering and performing treatments and interventions, pulse oximetry, re-evaluation of patient's condition and review of old charts Procedure Name: Intubation Date/Time: 02/25/2022 12:55 PM  Performed by: Jeanell Sparrow, DOPre-anesthesia Checklist: Patient identified, Patient being monitored, Emergency Drugs available, Timeout performed and Suction available Oxygen Delivery Method: Non-rebreather mask Preoxygenation: Pre-oxygenation with 100% oxygen Induction Type: Rapid sequence Ventilation: Mask ventilation without difficulty Laryngoscope Size: Glidescope Grade View: Grade II Tube size: 8.0 mm Number of attempts: 1 Placement Confirmation: ETT inserted through vocal cords under direct vision, CO2 detector, Breath sounds checked- equal and bilateral and Positive ETCO2 Secured at: 23 cm Tube secured with: ETT holder Dental Injury: Teeth and Oropharynx as per pre-operative assessment  Difficulty Due To: Difficulty was anticipated, Difficult Airway- due to limited oral opening and Difficult Airway- due to large tongue Comments: Frothy secretions from trachea       Cardiopulmonary Resuscitation (CPR) Procedure Note  Directed/Performed by: Jeanell Sparrow I personally directed ancillary staff and/or performed CPR in an effort to regain return of spontaneous circulation and to maintain cardiac, neuro and systemic perfusion.  Pt with intermittent ROSC but was transient.  ACLS performed per protocol. Patient was also given TNK given hx of PE  Medical Decision making  On arrival to CT area pt was agitated, breathing spontaneously, grabbing at staff and requesting something to drink. Able to redirect patient. Difficult to obtain vitals 2/2 habitus. Pt became unresponsive prior to any medications being administered and ACLS protocol was initiated, see code navigator. Given TNK and ACLS was continued for >20 minutes s/p TNK. PCCM at bedside to assist. Primary team and chaplain also at bedside.   Assessment and Plan  Pt with cardiac arrest, unclear source but suspect PE.   Pt without spontaneous respirations, no spontaneous cardiac activity, PEA on life pack. No shock-able rhythm during code. Capnography c/w apnea. No response to noxious stimulus or spontaneous movements/respirations. No purposeful activity. Resuscitation efforts suspended and TOD called at 10:26 am. Primary team to notify next of kin.   TOD 10:26

## 2022-02-22 DEATH — deceased

## 2022-02-25 LAB — PROTHROMBIN GENE MUTATION

## 2022-02-25 LAB — FACTOR 5 LEIDEN

## 2022-03-28 ENCOUNTER — Ambulatory Visit: Payer: BLUE CROSS/BLUE SHIELD | Admitting: Family
